# Patient Record
Sex: Male | Born: 1935 | Race: White | Hispanic: No | Smoking: Former smoker
Health system: Southern US, Community
[De-identification: ages and names within clinical notes are randomized; demographics above are authoritative.]

## PROBLEM LIST (undated history)

## (undated) DIAGNOSIS — M549 Dorsalgia, unspecified: Secondary | ICD-10-CM

## (undated) DIAGNOSIS — C4490 Unspecified malignant neoplasm of skin, unspecified: Secondary | ICD-10-CM

## (undated) DIAGNOSIS — G4700 Insomnia, unspecified: Secondary | ICD-10-CM

## (undated) DIAGNOSIS — R42 Dizziness and giddiness: Secondary | ICD-10-CM

## (undated) DIAGNOSIS — I82409 Acute embolism and thrombosis of unspecified deep veins of unspecified lower extremity: Secondary | ICD-10-CM

## (undated) DIAGNOSIS — R269 Unspecified abnormalities of gait and mobility: Secondary | ICD-10-CM

## (undated) DIAGNOSIS — I2699 Other pulmonary embolism without acute cor pulmonale: Secondary | ICD-10-CM

## (undated) DIAGNOSIS — I4891 Unspecified atrial fibrillation: Secondary | ICD-10-CM

## (undated) DIAGNOSIS — I2781 Cor pulmonale (chronic): Secondary | ICD-10-CM

## (undated) DIAGNOSIS — I10 Essential (primary) hypertension: Secondary | ICD-10-CM

## (undated) HISTORY — PX: FOOT SURGERY: SHX648

## (undated) HISTORY — DX: Unspecified atrial fibrillation: I48.91

## (undated) HISTORY — DX: Other pulmonary embolism without acute cor pulmonale: I26.99

## (undated) HISTORY — DX: Cor pulmonale (chronic): I27.81

## (undated) HISTORY — PX: SHOULDER SURGERY: SHX246

## (undated) HISTORY — DX: Dorsalgia, unspecified: M54.9

## (undated) HISTORY — DX: Acute embolism and thrombosis of unspecified deep veins of unspecified lower extremity: I82.409

## (undated) HISTORY — DX: Essential (primary) hypertension: I10

## (undated) HISTORY — DX: Unspecified malignant neoplasm of skin, unspecified: C44.90

## (undated) HISTORY — DX: Insomnia, unspecified: G47.00

## (undated) HISTORY — PX: BACK SURGERY: SHX140

## (undated) HISTORY — DX: Unspecified abnormalities of gait and mobility: R26.9

---

## 1997-10-31 ENCOUNTER — Ambulatory Visit (HOSPITAL_COMMUNITY): Admission: RE | Admit: 1997-10-31 | Discharge: 1997-10-31 | Payer: Self-pay | Admitting: Gastroenterology

## 1998-10-30 ENCOUNTER — Ambulatory Visit (HOSPITAL_COMMUNITY): Admission: RE | Admit: 1998-10-30 | Discharge: 1998-10-30 | Payer: Self-pay | Admitting: Neurosurgery

## 1998-10-30 ENCOUNTER — Encounter: Payer: Self-pay | Admitting: Neurosurgery

## 1999-03-31 ENCOUNTER — Encounter: Payer: Self-pay | Admitting: Neurosurgery

## 1999-04-01 ENCOUNTER — Inpatient Hospital Stay (HOSPITAL_COMMUNITY): Admission: RE | Admit: 1999-04-01 | Discharge: 1999-04-04 | Payer: Self-pay | Admitting: Neurosurgery

## 1999-04-01 ENCOUNTER — Encounter: Payer: Self-pay | Admitting: Neurosurgery

## 1999-07-01 ENCOUNTER — Encounter: Admission: RE | Admit: 1999-07-01 | Discharge: 1999-08-07 | Payer: Self-pay | Admitting: Neurosurgery

## 1999-09-04 ENCOUNTER — Encounter: Payer: Self-pay | Admitting: Neurosurgery

## 1999-09-04 ENCOUNTER — Ambulatory Visit (HOSPITAL_COMMUNITY): Admission: RE | Admit: 1999-09-04 | Discharge: 1999-09-04 | Payer: Self-pay | Admitting: Neurosurgery

## 1999-09-18 ENCOUNTER — Ambulatory Visit (HOSPITAL_COMMUNITY): Admission: RE | Admit: 1999-09-18 | Discharge: 1999-09-18 | Payer: Self-pay | Admitting: Neurosurgery

## 1999-09-18 ENCOUNTER — Encounter: Payer: Self-pay | Admitting: Neurosurgery

## 1999-10-03 ENCOUNTER — Ambulatory Visit (HOSPITAL_COMMUNITY): Admission: RE | Admit: 1999-10-03 | Discharge: 1999-10-03 | Payer: Self-pay | Admitting: Neurosurgery

## 1999-10-03 ENCOUNTER — Encounter: Payer: Self-pay | Admitting: Neurosurgery

## 2002-01-27 ENCOUNTER — Encounter: Admission: RE | Admit: 2002-01-27 | Discharge: 2002-01-27 | Payer: Self-pay | Admitting: Neurosurgery

## 2002-01-27 ENCOUNTER — Encounter: Payer: Self-pay | Admitting: Neurosurgery

## 2002-03-07 ENCOUNTER — Encounter: Payer: Self-pay | Admitting: Neurosurgery

## 2002-03-09 ENCOUNTER — Encounter: Payer: Self-pay | Admitting: Neurosurgery

## 2002-03-09 ENCOUNTER — Inpatient Hospital Stay (HOSPITAL_COMMUNITY): Admission: RE | Admit: 2002-03-09 | Discharge: 2002-03-12 | Payer: Self-pay | Admitting: Neurosurgery

## 2003-02-14 ENCOUNTER — Ambulatory Visit (HOSPITAL_COMMUNITY): Admission: RE | Admit: 2003-02-14 | Discharge: 2003-02-14 | Payer: Self-pay | Admitting: Orthopedic Surgery

## 2006-11-22 ENCOUNTER — Ambulatory Visit (HOSPITAL_COMMUNITY): Admission: RE | Admit: 2006-11-22 | Discharge: 2006-11-22 | Payer: Self-pay | Admitting: Gastroenterology

## 2006-11-23 ENCOUNTER — Ambulatory Visit (HOSPITAL_COMMUNITY): Admission: RE | Admit: 2006-11-23 | Discharge: 2006-11-23 | Payer: Self-pay | Admitting: Gastroenterology

## 2009-02-01 ENCOUNTER — Emergency Department (HOSPITAL_COMMUNITY): Admission: EM | Admit: 2009-02-01 | Discharge: 2009-02-02 | Payer: Self-pay | Admitting: Emergency Medicine

## 2009-04-05 ENCOUNTER — Encounter
Admission: RE | Admit: 2009-04-05 | Discharge: 2009-04-05 | Payer: Self-pay | Source: Home / Self Care | Admitting: Specialist

## 2010-03-02 HISTORY — PX: TOTAL HIP ARTHROPLASTY: SHX124

## 2010-04-01 LAB — SURGICAL PCR SCREEN: MRSA, PCR: NEGATIVE

## 2010-04-01 LAB — DIFFERENTIAL
Basophils Relative: 1 % (ref 0–1)
Eosinophils Absolute: 0.2 10*3/uL (ref 0.0–0.7)
Eosinophils Relative: 2 % (ref 0–5)
Lymphs Abs: 2.3 10*3/uL (ref 0.7–4.0)
Monocytes Relative: 8 % (ref 3–12)
Neutrophils Relative %: 61 % (ref 43–77)

## 2010-04-01 LAB — URINE MICROSCOPIC-ADD ON

## 2010-04-01 LAB — CBC
MCH: 33.3 pg (ref 26.0–34.0)
MCV: 96.4 fL (ref 78.0–100.0)
Platelets: 223 10*3/uL (ref 150–400)
RDW: 13 % (ref 11.5–15.5)
WBC: 7.9 10*3/uL (ref 4.0–10.5)

## 2010-04-01 LAB — BASIC METABOLIC PANEL
BUN: 22 mg/dL (ref 6–23)
CO2: 28 mEq/L (ref 19–32)
Chloride: 102 mEq/L (ref 96–112)
Creatinine, Ser: 0.97 mg/dL (ref 0.4–1.5)
Glucose, Bld: 90 mg/dL (ref 70–99)

## 2010-04-01 LAB — URINALYSIS, ROUTINE W REFLEX MICROSCOPIC
Hgb urine dipstick: NEGATIVE
Protein, ur: NEGATIVE mg/dL
Urine Glucose, Fasting: NEGATIVE mg/dL
Urobilinogen, UA: 0.2 mg/dL (ref 0.0–1.0)

## 2010-04-08 ENCOUNTER — Inpatient Hospital Stay (HOSPITAL_COMMUNITY): Payer: MEDICARE

## 2010-04-08 ENCOUNTER — Inpatient Hospital Stay (HOSPITAL_COMMUNITY)
Admission: RE | Admit: 2010-04-08 | Discharge: 2010-04-11 | DRG: 470 | Disposition: A | Discharge status: Home Health | Payer: MEDICARE | Attending: Orthopedic Surgery | Admitting: Orthopedic Surgery

## 2010-04-08 DIAGNOSIS — Z79899 Other long term (current) drug therapy: Secondary | ICD-10-CM

## 2010-04-08 DIAGNOSIS — H9190 Unspecified hearing loss, unspecified ear: Secondary | ICD-10-CM | POA: Diagnosis present

## 2010-04-08 DIAGNOSIS — R42 Dizziness and giddiness: Secondary | ICD-10-CM | POA: Diagnosis present

## 2010-04-08 DIAGNOSIS — M169 Osteoarthritis of hip, unspecified: Principal | ICD-10-CM | POA: Diagnosis present

## 2010-04-08 DIAGNOSIS — R39198 Other difficulties with micturition: Secondary | ICD-10-CM | POA: Diagnosis present

## 2010-04-08 DIAGNOSIS — H409 Unspecified glaucoma: Secondary | ICD-10-CM | POA: Diagnosis present

## 2010-04-08 DIAGNOSIS — M1610 Unilateral primary osteoarthritis, unspecified hip: Principal | ICD-10-CM | POA: Diagnosis present

## 2010-04-08 DIAGNOSIS — I10 Essential (primary) hypertension: Secondary | ICD-10-CM | POA: Diagnosis present

## 2010-04-08 LAB — ABO/RH: ABO/RH(D): O POS

## 2010-04-08 LAB — TYPE AND SCREEN
ABO/RH(D): O POS
Antibody Screen: NEGATIVE

## 2010-04-09 LAB — BASIC METABOLIC PANEL
Calcium: 8.3 mg/dL — ABNORMAL LOW (ref 8.4–10.5)
GFR calc Af Amer: >60 mL/min (ref >60)
GFR calc non Af Amer: >60 mL/min (ref >60)
Glucose, Bld: 165 mg/dL — ABNORMAL HIGH (ref 70–99)
Sodium: 136 mEq/L (ref 135–145)

## 2010-04-09 LAB — CBC
HCT: 36 % — ABNORMAL LOW (ref 39.0–52.0)
MCHC: 34.4 g/dL (ref 30.0–36.0)
Platelets: 221 10*3/uL (ref 150–400)
RDW: 12.9 % (ref 11.5–15.5)

## 2010-04-10 LAB — CBC
Hemoglobin: 11.7 g/dL — ABNORMAL LOW (ref 13.0–17.0)
MCHC: 34 g/dL (ref 30.0–36.0)

## 2010-04-10 LAB — BASIC METABOLIC PANEL
CO2: 32 mEq/L (ref 19–32)
Calcium: 8.3 mg/dL — ABNORMAL LOW (ref 8.4–10.5)
GFR calc Af Amer: >60 mL/min (ref >60)
GFR calc non Af Amer: >60 mL/min (ref >60)
Sodium: 140 mEq/L (ref 135–145)

## 2010-04-12 ENCOUNTER — Observation Stay (HOSPITAL_COMMUNITY)
Admission: EM | Admit: 2010-04-12 | Discharge: 2010-04-13 | DRG: 561 | Disposition: A | Discharge status: Home Health | Payer: MEDICARE | Attending: Orthopedic Surgery | Admitting: Orthopedic Surgery

## 2010-04-12 ENCOUNTER — Emergency Department (HOSPITAL_COMMUNITY): Payer: MEDICARE

## 2010-04-12 DIAGNOSIS — I498 Other specified cardiac arrhythmias: Secondary | ICD-10-CM | POA: Insufficient documentation

## 2010-04-12 DIAGNOSIS — H409 Unspecified glaucoma: Secondary | ICD-10-CM | POA: Insufficient documentation

## 2010-04-12 DIAGNOSIS — Z01812 Encounter for preprocedural laboratory examination: Secondary | ICD-10-CM | POA: Insufficient documentation

## 2010-04-12 DIAGNOSIS — Y92009 Unspecified place in unspecified non-institutional (private) residence as the place of occurrence of the external cause: Secondary | ICD-10-CM | POA: Insufficient documentation

## 2010-04-12 DIAGNOSIS — R42 Dizziness and giddiness: Secondary | ICD-10-CM | POA: Insufficient documentation

## 2010-04-12 DIAGNOSIS — Z01811 Encounter for preprocedural respiratory examination: Secondary | ICD-10-CM | POA: Insufficient documentation

## 2010-04-12 DIAGNOSIS — M1610 Unilateral primary osteoarthritis, unspecified hip: Secondary | ICD-10-CM | POA: Insufficient documentation

## 2010-04-12 DIAGNOSIS — Z96649 Presence of unspecified artificial hip joint: Secondary | ICD-10-CM | POA: Insufficient documentation

## 2010-04-12 DIAGNOSIS — W010XXA Fall on same level from slipping, tripping and stumbling without subsequent striking against object, initial encounter: Secondary | ICD-10-CM | POA: Insufficient documentation

## 2010-04-12 DIAGNOSIS — M169 Osteoarthritis of hip, unspecified: Secondary | ICD-10-CM | POA: Insufficient documentation

## 2010-04-12 DIAGNOSIS — T84029A Dislocation of unspecified internal joint prosthesis, initial encounter: Principal | ICD-10-CM | POA: Insufficient documentation

## 2010-04-12 LAB — BASIC METABOLIC PANEL
BUN: 14 mg/dL (ref 6–23)
CO2: 29 mEq/L (ref 19–32)
Chloride: 96 mEq/L (ref 96–112)
Creatinine, Ser: 0.82 mg/dL (ref 0.4–1.5)
Potassium: 3.2 mEq/L — ABNORMAL LOW (ref 3.5–5.1)

## 2010-04-12 LAB — DIFFERENTIAL
Basophils Absolute: 0 10*3/uL (ref 0.0–0.1)
Lymphocytes Relative: 13 % (ref 12–46)
Neutro Abs: 7.2 10*3/uL (ref 1.7–7.7)

## 2010-04-12 LAB — CBC
HCT: 35.3 % — ABNORMAL LOW (ref 39.0–52.0)
Hemoglobin: 12.3 g/dL — ABNORMAL LOW (ref 13.0–17.0)
WBC: 9.3 10*3/uL (ref 4.0–10.5)

## 2010-04-12 LAB — PROTIME-INR: INR: 1.1 (ref 0.00–1.49)

## 2010-04-13 NOTE — H&P (Signed)
[name], [name]                  ACCOUNT NO.:  [id number]  MEDICAL RECORD NO.:  [id number]          PATIENT TYPE:  INP  LOCATION:  NA                           FACILITY:  [facility]  PHYSICIAN:  Madlyn Frankel. Charlann Boxer, M.D.  DATE OF BIRTH:  10-30-1932  DATE OF ADMISSION: DATE OF DISCHARGE:                             HISTORY & PHYSICAL   ADMISSION DIAGNOSIS:  Right hip osteoarthritis.  BRIEF HISTORY:  This is the patient who was seen in Dr. Ranell Patrick' clinic and referred over to Dr. Charlann Boxer for evaluation of his right hip due to complaints of lingering pain, worsening pain, and interference with activities of daily living.  He was evaluated by Dr. Charlann Boxer and decided to proceed with an arthroplasty of the right hip.  Past medical history is somewhat benign.  He does have a history of vertigo from time to time and glaucoma.  He has had measles and mumps as a child.  He has occasional dizziness with some hearing loss, occasional rash with itching.  He does have urinary dysfunction with weak stream sometimes and orthopedically, has osteoarthritis and pain in the mornings.  Current medications are just some eye drops he uses for his glaucoma, he does not know the name of those and he takes doxepin every night for sleep.  ALLERGIES:  CODEINE.  SOCIAL HISTORY:  The patient is divorced.  He has BA from [place].  He has a past history of smoking.  He drinks alcohol very socially.  He has no history of substance abuse.  He has 1 child.  FAMILY HISTORY:  His mother had Alzheimer's.  Father had a stroke.  He had a brother with lung cancer and sister with cerebral palsy.  REVIEW OF SYSTEMS:  Notable for those difficulties described in the history present illness and past medical history.  His review of system sheet is otherwise unremarkable.  PHYSICAL EXAMINATION:  VITAL SIGNS:  The patient is 225 pounds.  He did not record a height.  His blood pressure today is 165/100.  He has been cautioned to go  back to his family practice doctor about that and get that under control prior to surgical date.  His respirations are 20, his pulse is 80. GENERAL:  Health is good. HEENT:  Shows he has glaucoma, some dizziness, and diminished hearing. NECK:  Unremarkable. CHEST:  Clear to auscultation bilaterally. HEART:  Has S1, S2.  There are no murmurs, rubs, or gallops. ABDOMEN:  Soft, nondistended. GI, GU:  Otherwise unremarkable except for the weak p.m. stream. EXTREMITIES:  Exam shows widespread osteoarthritis. DERMATOLOGICAL:  He does have a rash from time to time. NEUROLOGICAL:  Has vertigo that is transient.  LABORATORY DATA:  His labs, EKG and x-ray are pending through [facility].  IMPRESSION:  Right hip osteoarthritis.  PLAN:  He will admitted for right hip arthroplasty on February 7.  His discharge medications include Xarelto, Robaxin, MiraLax, Colace, 9 were given to him today.  His pain medicine will be given to him at discharge.     Russell L. Webb Silversmith, RN   ______________________________ Madlyn Frankel Charlann Boxer, M.D.  RLW/MEDQ  D:  03/27/2010  T:  03/27/2010  Job:  [id number]  Electronically Signed by Lauree Chandler NP-C on 04/09/2010 09:44:00 AM Electronically Signed by Durene Romans M.D. on 04/13/2010 09:17:57 AM

## 2010-04-13 NOTE — Op Note (Signed)
NAMEJESSE, NOSBISCH                  ACCOUNT NO.:  [id number]  MEDICAL RECORD NO.:  [id number]           PATIENT TYPE:  I  LOCATION:  0010                         FACILITY:  [facility]  PHYSICIAN:  Madlyn Frankel. Charlann Boxer, M.D.  DATE OF BIRTH:  September 25, 1932  DATE OF PROCEDURE:  04/08/2010 DATE OF DISCHARGE:                              OPERATIVE REPORT   PREOPERATIVE DIAGNOSIS:  Right hip osteoarthritis.  POSTOPERATIVE DIAGNOSIS:  Right hip osteoarthritis.  PROCEDURE:  Right total hip replacement utilizing DePuy component, size 56 pinnacle cup, 36 neutral Altrex liner, a size 11 high Trilock stem with 36 plus 5 asphere ball plus 5.  SURGEON:  Madlyn Frankel. Charlann Boxer, M.D.  ASSISTANT:  Kallie Edward  ANESTHESIA:  General.  SPECIMENS:  None.  COMPLICATIONS:  None.  DRAINS:  One Hemovac.  ESTIMATED BLOOD LOSS:  650 mL.  INDICATIONS FOR THE PROCEDURE:  Mr. Rosenow is a very pleasant 77 year old gentleman who was seen at the request of referring physician for evaluation of right hip arthritis.  He had evidence of calcification of his labrum as well as degenerative changes.  He had failed to respond to conservative measures including injections, medications as a significant reduction in his quality of life.  We discussed the hip replacement surgery.  The risks of infection, DVT, component failure, dislocation, need for revision surgery were all discussed and reviewed.  Consent was obtained for the benefit of pain relief.  PROCEDURE IN DETAIL:  The patient was brought to the operative theater. Once adequate anesthesia and preoperative antibiotics, Ancef 2 grams, administered, the patient was positioned to the left lateral decubitus position with the right side up.  The right lower extremity was then prepped and draped in a sterile fashion.  Time-out was performed identifying the patient, planned procedure and extremity.  An incision was based off the greater trochanter.  Sharp dissection was carried  to the iliotibial band and gluteal fascia which were incised posteriorly.  The short external rotators were identified and taken down separate from the posterior capsule.  An L capsulotomy was made and the hip dislocated.  A neck osteotomy was made based off anatomic landmarks utilizing the trial neck and head center of the patient's femoral head.  Following this, retractors were placed and attention was directed to the femur.  Femoral canal was opened with starting drill, hand reamed once, and then irrigated to try to prevent fat emboli.  I began broaching with a size 1 broach setting anteversion at about 20-25 degrees, setting a little bit more anteversion on his hip.  I broached up to a size 10 initially which sat a little bit lower than my neck cut.  A calcar planer was used to remove little bit of medial bone.  I packed off the femur down and went to the acetabulum.  Acetabular retractors were placed.  The labrum was debrided.  I began reaming with a 48 reamer, reamed up to 55 reamer with excellent bony bed preparation. Size 56 pinnacle cup was chosen and was impacted and sat at approximately 35 degrees of abduction and 20 degrees of forward flexion with  the anterior rim palpable.  Based on this and the anteversion, I went ahead and placed a single iliac screw hole eliminator and impacted the final 36 neutral Altrex liner.  Trial reduction was now carried out.  I went ahead and placed a 10 broach in high offset neck.  With a 36 plus 1.5 ball, there was about 4- 5 mm shuck; however, the hip remained stable throughout the range of motion and was shorter compared to the down leg.  I went ahead and dislocated the hip, removed the trial broach.  I broached to an 59 which sat a couple of millimeters prior to my neck cut, which I wanted.  We chose an 11 high Trilock stem.  It was then impacted and sat at the level where the broach was.  I did repeat the trial, ended up using a  36 plus 5 ball which got me down to about a millimeter shuck.  Again, the hip range of motion was excellent without evidence of impingement.  The final 36 plus 5 asphere ball was chosen and impacted onto clean and dry trunnion.  The hip was reduced.  We irrigated the hip throughout the case again at this point.  I reapproximated posterior capsule with a #1 Vicryl.  I placed a medium Hemovac drain deep.  The iliotibial band and gluteal fascia were then reapproximated using #1 Vicryl.  The remainder of wound was closed with 2-0 Vicryl and running 4-0 Monocryl.  The hip was cleaned, dried and dressed sterilely utilizing sealant and Aquacel dressing.  The drain site was dressed separately.  He was then extubated and brought to the recovery in stable condition tolerating the procedure well.     Madlyn Frankel Charlann Boxer, M.D.     MDO/MEDQ  D:  04/08/2010  T:  04/08/2010  Job:  [id number]  Electronically Signed by Durene Romans M.D. on 04/13/2010 09:18:02 AM

## 2010-04-14 ENCOUNTER — Emergency Department (HOSPITAL_COMMUNITY): Payer: MEDICARE

## 2010-04-14 ENCOUNTER — Ambulatory Visit (HOSPITAL_COMMUNITY)
Admission: EM | Admit: 2010-04-14 | Discharge: 2010-04-16 | Disposition: A | Discharge status: Skilled Nursing Facility | Payer: MEDICARE | Source: Home / Self Care | Attending: Emergency Medicine | Admitting: Emergency Medicine

## 2010-04-14 DIAGNOSIS — X58XXXA Exposure to other specified factors, initial encounter: Secondary | ICD-10-CM | POA: Insufficient documentation

## 2010-04-14 DIAGNOSIS — T84029A Dislocation of unspecified internal joint prosthesis, initial encounter: Secondary | ICD-10-CM | POA: Insufficient documentation

## 2010-04-14 DIAGNOSIS — I10 Essential (primary) hypertension: Secondary | ICD-10-CM | POA: Insufficient documentation

## 2010-04-14 DIAGNOSIS — Z96649 Presence of unspecified artificial hip joint: Secondary | ICD-10-CM | POA: Insufficient documentation

## 2010-04-14 DIAGNOSIS — R42 Dizziness and giddiness: Secondary | ICD-10-CM | POA: Insufficient documentation

## 2010-04-14 DIAGNOSIS — S73006A Unspecified dislocation of unspecified hip, initial encounter: Secondary | ICD-10-CM

## 2010-04-22 NOTE — Op Note (Signed)
  [name], [name]                  ACCOUNT NO.:  [id number]  MEDICAL RECORD NO.:  [id number]           PATIENT TYPE:  I  LOCATION:  1618                         FACILITY:  [facility]  PHYSICIAN:  Georges Lynch. Jairy Angulo, M.D.DATE OF BIRTH:  10-01-1932  DATE OF PROCEDURE:  04/14/2010 DATE OF DISCHARGE:                              OPERATIVE REPORT   Note, I got call from emergency room late this afternoon at [facility] on April 14, 2010 that Mr. Morss redislocated his right total hip. Apparently, he had a right total hip done about little over a week ago by Dr. Charlann Boxer and he dislocated his hip this Saturday, 2 days ago.  He was discharged from the hospital on Sunday, went home and said that he was getting out of car and he slipped down the side of the car and he was not sure that is what happened but anyway, he presented this afternoon with a dislocated right total hip.  X-rays were reviewed by me in the emergency room.  I did see him in the emergency room, discussed the case with him and had him signed a consent.  PROCEDURE:  Closed reduction of the right anterior and superior dislocation of right total hip.  PREOPERATIVE DIAGNOSIS:  Closed reduction of the right anterior and superior dislocation of right total hip.  POSTOPERATIVE DIAGNOSIS:  Closed reduction of the right anterior and superior dislocation of right total hip.  DESCRIPTION OF PROCEDURE:  Under general anesthesia, after I marked the right leg in the holding area and after we did the appropriate time-out, I did a closed manipulation under general anesthesia with MAC for muscle relaxation.  The hip went in easily and AP x-ray verified the hip was well located.  There were no other abnormalities noted.  He had good motion of his foot, good circulation, pre and postop.  He was admitted to the hospital after placed in the PAS hose and a knee immobilizer.  He will be kept overnight.           ______________________________ Georges Lynch. Darrelyn Hillock, M.D.     RAG/MEDQ  D:  04/14/2010  T:  04/14/2010  Job:  [id number]  cc:   Madlyn Frankel Charlann Boxer, M.D. Fax: [phone]  Electronically Signed by Ranee Gosselin M.D. on 04/22/2010 01:11:39 PM

## 2010-04-25 NOTE — Discharge Summary (Signed)
  [name], [name]                  ACCOUNT NO.:  [id number]  MEDICAL RECORD NO.:  [id number]           PATIENT TYPE:  I  LOCATION:  1618                         FACILITY:  [facility]  PHYSICIAN:  Madlyn Frankel. Charlann Boxer, M.D.  DATE OF BIRTH:  06/20/1932  DATE OF ADMISSION:  04/14/2010 DATE OF DISCHARGE:                              DISCHARGE SUMMARY   This patient is less than 2 weeks, status post right total hip arthroplasty.  He has been admitted twice in the past 5 days for dislocation.  He was admitted yesterday and released for reduction of a dislocated right hip by Dr. Darrelyn Hillock.  His discharge condition is good.  His discharge plan will be for SNF rehabilitation for safety.  His discharge medications have not changed since previous discharge. 1. He will have Colace available as needed. 2. Hydrocodone for pain 7.5/500 mg 1-2 p.o. q.4-6 h. p.r.n. 3. Robaxin 500 mg q.6 h. p.r.n.  His discharge instructions were given.  He notes that he cannot go back home due to the fact that he keeps dislocating his hip to prevent further dislocation and initiate safety measures.  He will go to a SNF hopefully today.     Russell L. Webb Silversmith, RN   ______________________________ Madlyn Frankel Charlann Boxer, M.D.    RLW/MEDQ  D:  04/15/2010  T:  04/16/2010  Job:  [id number]  Electronically Signed by Lauree Chandler NP-C on 04/24/2010 01:21:41 PM Electronically Signed by Durene Romans M.D. on 04/25/2010 07:03:01 AM

## 2010-04-25 NOTE — H&P (Signed)
  [name], [name]                  ACCOUNT NO.:  [id number]  MEDICAL RECORD NO.:  [id number]           PATIENT TYPE:  I  LOCATION:  1618                         FACILITY:  [facility]  PHYSICIAN:  Madlyn Frankel. Charlann Boxer, M.D.  DATE OF BIRTH:  [birth year]  DATE OF ADMISSION:  04/14/2010 DATE OF DISCHARGE:                             HISTORY & PHYSICAL   This patient is less than 2 weeks, status post right total hip arthroplasty.  He has been admitted twice in the past week for dislocation.  He was admitted on the 13th and released by Dr. Darrelyn Hillock.  The patient's discharge condition is good.  His other discharge information is not changed since his surgical discharge and his last discharge for dislocation is on Sunday.  He will follow up with Dr. Charlann Boxer as scheduled in the next week.  DISPOSITION/PLAN:  SNF at this time for safety.     Russell L. Webb Silversmith, RN   ______________________________ Madlyn Frankel Charlann Boxer, M.D.    RLW/MEDQ  D:  04/15/2010  T:  04/16/2010  Job:  [id number]  Electronically Signed by Lauree Chandler NP-C on 04/24/2010 01:21:44 PM Electronically Signed by Durene Romans M.D. on 04/25/2010 07:02:58 AM

## 2010-05-09 NOTE — Discharge Summary (Signed)
  [name], [name]                  ACCOUNT NO.:  [id number]  MEDICAL RECORD NO.:  [id number]           PATIENT TYPE:  I  LOCATION:  1612                         FACILITY:  [facility]  PHYSICIAN:  Madlyn Frankel. Charlann Boxer, M.D.  DATE OF BIRTH:  1932/11/08  DATE OF ADMISSION:  04/08/2010 DATE OF DISCHARGE:  04/11/2010                              DISCHARGE SUMMARY   BRIEF HISTORY:  The patient was seen to Dr. Ranell Patrick office and referred over to Dr. Charlann Boxer for evaluation of his right hip pain.  It was worsening pain and interfering with his activities of daily living, and he finally decided to proceed with arthroplasty of the right hip.  PAST MEDICAL HISTORY:  Somewhat benign.  He has vertigo from time to time.  He had measles and mumps as a child, mild hearing loss and occasional rash, and mild urinary dysfunction with osteoarthritis.  DISCHARGE DIAGNOSIS:  Right hip osteoarthritis, status post arthroplasty.  HOSPITAL COURSE:  The patient was admitted through same-day surgery on February 7, taken to the operating theater and underwent right hip arthroplasty by anterior approach without any difficulties.  He was brought to PACU for recovery and then to [facility] for further recovery and rehabilitation.  Since that time, he has advanced his diet to regular, has been up with physical therapy and done well.  DISCHARGE CONDITION:  Good.  Discharge instructions were given to the patient.  He has a Aquacel dressing in place.  He can shower and he is to keep the wounds dry as possible otherwise for 2 weeks.  He will follow up with Dr. Charlann Boxer in 2 weeks.  He will have home health physical therapy.  DISCHARGE MEDICATIONS:  Include: 1. Acetaminophen 325 mg every 4 hours as needed. 2. Dulcolax as needed. 3. Ferrous sulfate 325 mg 3 times a day. 4. Robaxin 500 mg every 6 hours as needed. 5. MiraLax 17 grams a day as needed. 6. Xarelto 10 mg every day for 10 days. 7. Tramadol 1-2 every 4 to 6 hours for  pain. 8. Diastone 75 mg at bedtime. 9. Glucosamine/chondroitin daily. 10.Hydrochlorothiazide 25 mg a day. 11.L-Arginine 1000 mg twice daily. 12.Lumigan 0.3% drops in both eyes. 13.He takes over-the-counter red yeast rice, vitamin C, vitamin E. 14.He uses Voltaren gel as needed.     Gregory L. Webb Silversmith, RN   ______________________________ Madlyn Frankel Charlann Boxer, M.D.    RLW/MEDQ  D:  04/11/2010  T:  04/11/2010  Job:  [id number]  Electronically Signed by Durene Romans M.D. on 05/09/2010 07:04:48 AM

## 2010-05-18 NOTE — H&P (Signed)
NAMEKAMARII, [name]                  ACCOUNT NO.:  [id number]  MEDICAL RECORD NO.:  [id number]           PATIENT TYPE:  E  LOCATION:  WLED                         FACILITY:  [facility]  PHYSICIAN:  Leonides Grills, M.D.     DATE OF BIRTH:  07/09/1932  DATE OF ADMISSION:  04/12/2010 DATE OF DISCHARGE:                             HISTORY & PHYSICAL   CHIEF COMPLAINTS:  Right hip pain.  BRIEF HISTORY:  The patient is a 77 year old male with recent right total hip on April 08, 2010, by Dr. Charlann Boxer.  The patient went to bathroom today earlier, was wiping up something on the floor, and the patient "hit the floor."  He was unable to bear weight on the right hip. No loss of consciousness.  No chest pain.  No shortness breath.  No other injury other than the right hip.  No recent bowel movement.  He is eating and drinking well.  PAST MEDICAL HISTORY: 1. Vertigo. 2. Glaucoma.  MEDICATIONS:  Please see chart.  ALLERGIES: 1. CODEINE. 2. MORPHINE SULFATE.  FAMILY HISTORY:  Mother with Alzheimer's.  Father with stroke.  Brother with lung cancer.  Sister with cerebral palsy.  REVIEW OF SYSTEMS:  Negative for cardiac disease, diabetes, respiratory disease, fevers, chills, gout, or rheumatoid arthritis.  Positive for vertigo and glaucoma.  PHYSICAL EXAMINATION:  VITAL SIGNS:  Blood pressure 151/96, pulse 109, respiratory rate 14, 96% on nasal cannula, 2 liters.  The patient is afebrile. GENERAL:  The patient is a well-developed, well-nourished male, in no acute distress. CARDIAC:  Regular rate and rhythm.  No murmurs, rubs, or gallops. CHEST:  Clear to auscultation bilaterally.  No wheezing, rhonchi, or rales. ABDOMEN:  Soft, nontender.  Bowel sounds throughout in 4 quadrants. EYES:  Extraocular movements are intact. SKIN:  Surgical incision right hip is well approximated with a subcu stitch.  There is no purulence.  No signs of infection. NEURO:  L4-S1 intact bilaterally.  Dorsiflexion,  plantar flexion bilaterally intact. EXTREMITIES:  The bilateral lower extremities nontender.  Bilateral hips, right leg is out of length.  No gross deformities.  No tenderness of bilateral lower legs. VASCULAR:  Dorsal pedal pulses 2+ bilaterally.  RADIOGRAPHS:  Initial right hip film done earlier today showed posterior dislocation of right total hip arthroplasty without any pelvic fractures or femoral shaft fractures, status post reduction right hip, prosthesis well located.  No acute fractures.  ASSESSMENT/PLAN:  The patient is a 77 year old male with an acute right total hip dislocation.  Patient is status post recent right total hip on April 08, 2010, by Dr. Charlann Boxer.  Right hip dislocation was reduced in the ER under conscious sedation.  Right leg knee immobilizer applied and is to be on all times.  The patient is to weight bear as tolerated.  The patient will be seen by Dr. Charlann Boxer in a.m.     Richardean Canal, P.A.   ______________________________ Leonides Grills, M.D.    GC/MEDQ  D:  04/12/2010  T:  04/12/2010  Job:  [id number]  cc:   Madlyn Frankel Charlann Boxer, M.D. Fax: [phone]  Electronically Signed by Sullivan Lone  CLARK P.A. on 05/06/2010 09:11:28 AM Electronically Signed by Leonides Grills M.D. on 05/18/2010 08:32:08 AM

## 2010-06-21 NOTE — Discharge Summary (Signed)
  [name], Gregory                  ACCOUNT NO.:  [id number]  MEDICAL RECORD NO.:  [id number]           PATIENT TYPE:  I  LOCATION:  1605                         [facility]ACILITY:  [facility]  PHYSICIAN:  Leonides Grills, M.D.     DATE OF BIRTH:  [birth year]  DATE OF ADMISSION:  04/12/2010 DATE OF DISCHARGE:  04/13/2010                        DISCHARGE SUMMARY - REFERRING   ADMITTING DIAGNOSES: 1. Right total hip dislocation. 2. Vertigo. 3. Glaucoma.  DISCHARGE DIAGNOSES: 1. Status post closed reduction right total hip dislocation. 2. Vertigo.  HISTORY OF PRESENT ILLNESS:  This is a 77 year old male with a recent right total hip on April 08, 2010, by Dr. Charlann Boxer.  The patient went to the bathroom on April 12, 2010, and was wiping up some kind of floor and the patient "hit the floor."  He was unable to bear weight on the right hip.  No loss of consciousness.  No chest pain or shortness of breath.  No other injury other than the right hip.  The patient was brought to the [facility] ER where he was found to have a posterior dislocation of his right total hip arthroplasty.  No other pelvic fractures identified.  Right total hip was reduced in the ER under conscious sedation.  The immobilizer was applied.  The patient was admitted for pain control.  CONSULTS:  PT, OT consult.  HOSPITAL COURSE:  The patient was admitted for less than 24-hour observation status post closed reduction under conscious sedation of a right total hip.  The patient afebrile, vital signs stable on April 13, 2010.  The patient had good oral intake.  No nausea or vomiting. Pain under control.  The patient was to work with physical therapy and then discharged to home once he had worked with therapy to review hip precautions.  RADIOGRAPHS:  Radiographs dated April 12, 2010, right hip, two-view, showed right hip prosthesis dislocation superiorly and posterior dislocation, no fractures. 1. Chest x-ray dated  April 12, 2010, showed no acute     cardiopulmonary findings. 2. Portable hip, one-view right hip, showed right hip arthroplasty to     be well located, no bony abnormalities.  DISCHARGE INSTRUCTIONS: 1. The patient weightbear as tolerated.  Knee immobilizer on the right     leg. 2. Wound care:  Keep wound dry until followup with Dr. Charlann Boxer in 2     weeks. 3. Home health/PT:  Interim home health.  CONDITION ON DISCHARGE:  The patient discharged to home in good stable condition.  DIET:  Regular diet, no restrictions.  Dictated For:  Dr. Leonides Grills.     Richardean Canal, P.A.   ______________________________ Leonides Grills, M.D.    GC/MEDQ  D:  05/07/2010  T:  05/07/2010  Job:  [id number]  cc:   Leonides Grills, M.D. Fax: [phone]  Madlyn Frankel. Charlann Boxer, M.D. Fax: [phone]  Electronically Signed by Richardean Canal P.A. on 05/28/2010 02:46:08 PM Electronically Signed by Leonides Grills M.D. on 06/21/2010 07:49:21 AM

## 2010-07-01 DIAGNOSIS — I2699 Other pulmonary embolism without acute cor pulmonale: Secondary | ICD-10-CM

## 2010-07-01 HISTORY — DX: Other pulmonary embolism without acute cor pulmonale: I26.99

## 2010-07-18 NOTE — Op Note (Signed)
[name]  Patient:    [name]                          MRN: [id number] Proc. Date: 04/01/99 Adm. Date:  [id number] Attending:  Jackelyn Knife                           Operative Report  PREOPERATIVE DIAGNOSIS:  Spinal stenosis L3-4.  POSTOPERATIVE DIAGNOSIS:  Spinal stenosis L3-4.  OPERATION PERFORMED:  Bilateral decompressive L3, L4, hemilaminotomies, medial facetectomy on the right side.  SURGEON:  Izell [place]. Elesa Hacker, M.D.  ASSISTANT:  Clydene Fake, M.D.  DESCRIPTION OF PROCEDURE:  Under general endotracheal anesthesia, this man was positioned prone over laminectomy cushions.  The lumbosacral area was prepped and the patient draped in the usual manner.  A preliminary cross-table lateral metallic marker in place was taken to assure proper localization of the incision.  The skin was then opened and a bilateral approach was made in such a way to allow access to the L3, L4 intralaminar space on each side.  Self-retaining retractors were put  into place after repeating a second cross-table lateral with additional instruments in place to positively identify the proper level.  Using the operating microscope and high-speed drill, hemilaminotomies were performed.  The spinous process and posterolateral sheath and ligament were preserved.  The greatly thickened ligamentum flavum was encountered and removed. This was a large part of the stenotic problem as was bony overgrowth.  Care was  taken to do a medial facetectomy on the right with foraminotomies to assure adequate decompression on the symptomatic side.  After this, the wound was copiously irrigated with antibiotic solution.  Hemostasis was obtained and the wound was then closed in layers.  A sterile dressing was applied and the patient was taken to the recovery room in good condition. DD:  04/01/99 TD:  04/01/99 Job: [id number]

## 2010-07-18 NOTE — Discharge Summary (Signed)
[place]  Patient:    [name]                          MRN: [id number] Adm. Date:  [id number] Disc. Date: [id number] Attending:  Jackelyn Knife                           Discharge Summary  SUMMARY:  [name] is a 77 year old man who had been followed along over he last several months because of a progressive problem of lumbar spinal stenosis t L3-4.  He eventually elected to undergo bilateral decompressive L3-4 surgery and this as carried out on April 01, 1999, in the form of bilateral decompressive hemilaminotomies and foraminotomies on the right side at L3-4.  Postoperatively, he had some persistent serosanguineous drainage from his wound  that initially was rather copious and prolonged his hospital stay by two days. He had a minor temperature elevation as well and was, therefore, kept on his perioperative antibiotics for an additional day.  Cipro was then started by mouth prior to discharge and my plan was to continue that.  I discontinued his IV antibiotics as his Cipro became effective.  In addition to the drainage, he had  rather severe nausea initially in the first 24 to 36 hours postoperatively, but  this was managed with a change in his medication.  At the time of discharge he was able to be up and about and his drainage had stopped.  FINAL DIAGNOSES: 1. Lumbar spinal stenosis L3-4. 2. Persistent postoperative wound drainage which has stopped at the time of    discharge. 3. Early, but rather persistent, postoperatively nausea that contributed to a    longer hospital stay.  CONDITION AT DISCHARGE:  Improving.  INSTRUCTIONS TO PATIENT: 1. Up ad lib with a 10 pound weight lifting restriction for four weeks. 2. Regular diet. 3. He is to take his temperature and record it three to four times a day. 4. Discharge medications:  Vicodin and an additional five days of Cipro. 5. He is to come back to see me in five  days in the office. DD:  04/04/99 TD:  04/05/99 Job: [id number]

## 2010-07-18 NOTE — Discharge Summary (Signed)
[name], [name] NO.:  [id number]   MEDICAL RECORD NO.:  [id number]                   PATIENT TYPE:  INP   LOCATION:  3009                                 FACILITY:  [facility]   PHYSICIAN:  Clydene Fake, M.D.               DATE OF BIRTH:  [birth year]   DATE OF ADMISSION:  03/09/2002  DATE OF DISCHARGE:  03/12/2002                                 DISCHARGE SUMMARY   DIAGNOSIS:  Herniated nucleus pulposus, L2-3 with stenosis, spondylosis and  instability at 3-4 and 4-5 in a patient with prior surgery.   DISCHARGE DIAGNOSIS:  Herniated nucleus pulposus, L2-3 with stenosis,  spondylosis and instability at 3-4 and 4-5 ina  patient with prior surgery.   PROCEDURE:  1. Re-do decompressive laminotomy at L2-3, 3-4 and 4-5 with diskectomy at     right 2-3.  2. Posterior lumbar interbody fusion at L2-3, 3-4 and 4-5 with interbody     cages, Leopard cage at 2-3 and Brown cages at 3-4 and 4-5, segmented     pedicle screw fixation L2 through 5 with posterolateral fusion L2 through     5 with autograft, allograft and bone marrow aspirate.   REASON FOR ADMISSION:  The patient is a 77 year old gentleman who has  undergone two lumbar laminectomies in the past couple years who continues to  have back pain.  The pain radiates bilaterally in his back, down his legs,  upper legs, not usually past the knee.  Any activity seems to make things  worse and there has been worsening over the last couple years, especially  the last few months.  Epidural injections and other interventions have not  helped.   The work up included an MRI and x-ray showing severe degeneration and  instability at 3-4 and 4-5, prior surgery seen and there is a disk  herniation at the right side at 2-3.  The patient is brought in for  decompression and fusion.   HOSPITAL COURSE:  The patient was admitted on the day of surgery and  underwent the procedure as above without complications.  750 cc  of Cell  Saver blood was given back.  The patient was watched, sent down to Intensive  Care Unit and remained stable.  Postoperatively, he was moving his legs  well.  Sensory and motor strength seemed intact.  The incision was clean,  dry and intact.  H&H first day postoperatively was 12.1 and 35.3. The  patient seemed to be doing well, was transferred to the floor on the 9th and  worked on increasing his activities with brace.  PT and OT were consulted.  He continued to improve.  By the 11th, he was doing well and the incision  remained clean, dry and intact.  He was ambulating well.  He was discharged  home in stable condition.   DISCHARGE MEDICATIONS:  Same as pre-hospitalization plus  Percocet and  Flexeril.   FOLLOW UP:  Will be in two weeks in my office.  No driving for five days.  No strenuous activity with brace.                                               Clydene Fake, M.D.    JRH/MEDQ  D:  04/06/2002  T:  04/07/2002  Job:  [id number]

## 2010-07-18 NOTE — Op Note (Signed)
NAME:  [name] NO.:  [id number]   MEDICAL RECORD NO.:  [id number]                   PATIENT TYPE:  INP   LOCATION:  2859                                 FACILITY:  [facility]   PHYSICIAN:  Clydene Fake, M.D.               DATE OF BIRTH:  04/14/1932   DATE OF PROCEDURE:  DATE OF DISCHARGE:                                 OPERATIVE REPORT   PREOPERATIVE DIAGNOSES:  Herniated nucleus pulposus, L2-3 with stenosis,  spondylosis and instability at 2-3, 3-4 and 4-5 lumbar spine with prior  surgery.   POSTOPERATIVE DIAGNOSES:  Herniated nucleus pulposus, L2-3 with stenosis,  spondylosis and instability at 2-3, 3-4 and 4-5 lumbar spine with prior  surgery.   PROCEDURE:  1. Repeat decompressive laminectomy at L2-3, 3-4 and 4-5.  2. Diskectomy, right side, L2-3 for the herniated nucleus pulposus.  3. Posterior lumbar interbody fusion, L2-3, 3-4 and 4-5.  4. Interbody cages at three levels (Leopard cage at L2-3, Browning cages     bilaterally at L3-4 and 4-5).  5. Segmented Monarch pedicle screw fixation L2 through 5, posterolateral     fusion L2-5 (three levels).  6. Autograft bone.  7. Allow graft, bone marrow aspirate.   SURGEON:  Dr. Phoebe Perch.   ANESTHESIA:  General.   ESTIMATED BLOOD LOSS:  1500 cc.   BLOOD GIVEN:  750 cc of Cell Saver blood given back.   COMPLICATIONS:  None.   DISPOSITION:  To recovery room in stable condition.   REASON FOR PROCEDURE:  Patient is a 77 year old gentleman who has undergone  two lumbar laminectomies in the past but over the last couple of years  continued to have back pain.  The pain radiates bilaterally in his back,  radiating down his legs and the upper legs, not usually past the knee, but  every so often it does.  Any activity seems to make things worse.  He has  been through epidural injections and other interventions that have not  helped, the pain has been worsening again over time.   Work up  including MRI and x-rays show severe degeneration and some  instability at 3-4 and 4-5, prior surgery seen and a disk herniation on the  right side at L2-3.  The patient is brought in for decompression and fusion.   PROCEDURE IN DETAIL:  The patient was brought into the operating room,  general anesthesia was induced and the patient was placed in the prone  position on the Wilson frame with all pressure points padded.  The patient  was prepped and draped in the sterile fashion.  The surgical incision was  injected with 10 cc of 1% Lidocaine with epinephrine.  Incision was then  made at the site of previous scar but incision was extended both cephalad  and caudally.  Incision taken to fascia, hemostasis obtained with Bovie  cauterization.  Fascia was  incised and subperiosteal dissection was done  over the L1-2-3-4 spinous processes and lamina to the facets.  There was a  lot of scar between 4-5 and looks like 5 laminectomy had already been  performed along with hemilaminotomy at other levels.  We dissected out  laterally and found the transverse process of L2, 3, 4 and 5 and dissected.  At this point, x-rays obtained confirmed our positioning.  Markers at the  levels of 2, 3 and 4.  We started our decompression using Kerrison punches,  Leksell rongeurs and a high speed drill.  Right-sided decompressive  laminectomy was performed at 2-3, along with a facetectomy, uncovering the  central and both the upper and lower nerve roots.  This dissection was then  continued caudally and bilateral laminectomy including bilateral facetectomy  was then performed at 3-4 and 4-5 and allowed dissection through scar tissue  up to both those levels.  Some scar was left attached to the dura when  necessary.  We performed extensive foraminotomies at the lumen of the facets  and exposing the lateral disk spaces along with the upper root at each of  these levels.  Once we had good decompression, we started  exploring disk  position, we started at the right side at 2-3 disk space, hemostasis  obtained with bipolar cauterization.  The disk space was incised, diskectomy  performed with pituitary rongeurs and a ________ placed up under the dura,  up under the upper root and free fragment of disk was obtained, removing the  disk herniation that we had seen on the MRI.  When we were finished with the  decompression, the upper and lower roots, thecal sac at that side.  Attempt  was then taken to 3-4.  Disk space incised bilaterally, diskectomy performed  with pituitary rongeurs and then distracted the interspace with interspace  distractor to 11 mm.  We then started preparing the interspace for interbody  cages by use of scrapers, various broaches and scrapers at the end plates  and at the final approach with the broaches on the side while we kept the  distraction on the other, we then reversed, preparing both sides.  All the  bone that was removed during laminectomy was cleaned, chopped up into small  pieces and to this DVX putty was added.  This mixture was then packed into  two Browning cages 11 high x 9 wide and bone was also packed into the  interspace at 3-4.  We then tapped the [facility] cage into place and repeated  this process on the opposite side.  This was all done using fluoroscopic  imaging as a guide.  At the 4-5 disk space, hemostasis with bipolar  cauterization, incised the disk space and performed diskectomy with  pituitary rongeurs, distracted the interspace up to 11 mm and continued  cleaning out disk and prepared the interspace for cage seating by removing  the cages and end plate with these various broaches and scrapers and then  used the final curving broach on each side while holding distraction on the  opposite side.  We then filled two 11 high x 9 mm wide Browning cages with  autograft bone mixture and packed the interspace with bone.  We then tapped cage in the one side and  repeated the process on the opposite side.  When we  were finished, we did decompression in the central canal and bilateral nerve  roots at the 3-4 and 4-5 level.  Gelfoam thrombin was placed  at the disk  spaces for continuous hemostasis.  We then found our pedicle screw entry  points on the left side. Using a high speed drill to decorticate, we placed  a pedicle probe down the pedicle at L2, 3, 4 and 5 under fluoroscopic  imaging.  We used a ball probe to feel down the hole after removing the  probe indicating that we were in bone.  The pedicle was tapped and then 6.24  diameter pedicle screws were placed, 50 mm screws were used in L2, 4 and 5,  50 mm screw was used in L3 on the left side.  A rod was placed into the  screw heads and locking nuts placed over those and the screw heads were  tightened at L5 and at L4 there was some compression over the interspace and  that was tightened, we then compressed over the 3-4 interspace as we  tightened the L3 screw.  We then placed an interspace distractor in the 2-3  interspace on the right side, distracted up to 11 mm.  We then distracted  between L2 and 3 pedicle screws on the left side and then tightened the L2  screw, holding distraction at the 2-3 interspace.  We removed the distractor  from the right side and then used the Acumed T-lift instruments to prepare  the interspace for cage fusion using a Leopard cage for T-lift approach.  We  used various shavers and broaches, removing the cartilaginous implant and  getting good bleeding bone in the air space.  We then placed a trial, first  a 10 and then 11 mm high Leopard cage trial that fit well.  We packed an 11  mm Leopard cage with the autograft bone mixture and then placed this bone  into the interspace.  We then tapped the cage into place and using various  attempts got into a final position across the midline.  Fluoroscopic imaging  was used to guide this process.  At this point, we held  the pedicle points  of the right side at L2, 3, 4 and 5, using fluoroscopic imaging and topical  guideline, we decorticated the entry point with a high speed drill and  placed the pedicle probe down the pedicle.  We aspirated bone marrow blood  from each pedicle, we did this also on the left side and right side.  We  added this bone marrow aspirate to conduits from substitute to use in the  posterolateral fusion later in the case. We probed the pedicle with ball  probe, making sure there is no breach at the L2 level, there did seem to be  mal position and we repositioned this down the pedicle, tapped and re-  checked with the ball probe and then placed our pedicle screws.  We used  6.25 diameter Monarch screws by 50 mm long in each of the holes on the right  side.  Once the screws were in, rod was placed over the screw heads, locking  it into place and then starting at L5, we final tightened the nut with some compression over 4-5 we tightened the 4 nut, with compression over 3-4 we  tightened the 3 nut.  We then loosed the nut over the L2 screw in the left  side and compressed over the 2-3 interspace while we re-tightened the nut  over the 2 screw with compression over 2-3 on the right, we again tightened  that over the final construct.  Final AP and lateral images  were obtained  showing good position of all screws, rods and interbody cages at all three  levels.  The wounds were irrigated with antibiotic solution, transverse  processes were decorticated with high speed drill, lateral facets and the  rest of the autograft bone DVX putty and conduit bone substituted along with  the bone marrow aspirate was then placed in the posterolateral gutter from  L2 to L5 bilaterally for posterolateral fusion.  Gelfoam was placed over the  nerve roots to protect them from any bone falling on them.  We retracted all  the roots, air well decompressed, the retractors were removed and the  paraspinous  muscles and then fascia were closed with 0 Vicryl interrupted  suture and the subcutaneous tissues closed with 0, 2-0 and 3-0 Vicryl  interrupted suture and the skin closed with Benzoin and Steri-Strips.  Status post closure, the patient was placed back in the dorsals supine  position, awakened from anesthesia and transferred to recovery room in  stable condition.                                               Clydene Fake, M.D.    JRH/MEDQ  D:  03/09/2002  T:  03/10/2002  Job:  [id number]

## 2010-07-24 ENCOUNTER — Encounter: Payer: Self-pay | Admitting: Family Medicine

## 2010-07-24 ENCOUNTER — Encounter: Payer: Self-pay | Admitting: *Deleted

## 2010-07-24 DIAGNOSIS — G4700 Insomnia, unspecified: Secondary | ICD-10-CM | POA: Insufficient documentation

## 2010-07-24 DIAGNOSIS — H409 Unspecified glaucoma: Secondary | ICD-10-CM | POA: Insufficient documentation

## 2010-07-24 DIAGNOSIS — C4490 Unspecified malignant neoplasm of skin, unspecified: Secondary | ICD-10-CM | POA: Insufficient documentation

## 2010-07-24 DIAGNOSIS — M549 Dorsalgia, unspecified: Secondary | ICD-10-CM | POA: Insufficient documentation

## 2010-07-25 ENCOUNTER — Ambulatory Visit (INDEPENDENT_AMBULATORY_CARE_PROVIDER_SITE_OTHER): Payer: Medicare Other | Admitting: Cardiology

## 2010-07-25 ENCOUNTER — Encounter: Payer: Self-pay | Admitting: Cardiology

## 2010-07-25 ENCOUNTER — Ambulatory Visit
Admission: RE | Admit: 2010-07-25 | Discharge: 2010-07-25 | Disposition: A | Discharge status: Home / Self Care | Payer: Medicare Other | Source: Ambulatory Visit | Attending: Cardiology | Admitting: Cardiology

## 2010-07-25 ENCOUNTER — Inpatient Hospital Stay (HOSPITAL_COMMUNITY)
Admission: EM | Admit: 2010-07-25 | Discharge: 2010-08-01 | DRG: 176 | Disposition: A | Discharge status: Home / Self Care | Payer: Medicare Other | Attending: Emergency Medicine | Admitting: Emergency Medicine

## 2010-07-25 DIAGNOSIS — G4700 Insomnia, unspecified: Secondary | ICD-10-CM | POA: Diagnosis present

## 2010-07-25 DIAGNOSIS — Z7982 Long term (current) use of aspirin: Secondary | ICD-10-CM

## 2010-07-25 DIAGNOSIS — Z7901 Long term (current) use of anticoagulants: Secondary | ICD-10-CM

## 2010-07-25 DIAGNOSIS — Z96649 Presence of unspecified artificial hip joint: Secondary | ICD-10-CM

## 2010-07-25 DIAGNOSIS — R0989 Other specified symptoms and signs involving the circulatory and respiratory systems: Secondary | ICD-10-CM

## 2010-07-25 DIAGNOSIS — R911 Solitary pulmonary nodule: Secondary | ICD-10-CM | POA: Diagnosis present

## 2010-07-25 DIAGNOSIS — H409 Unspecified glaucoma: Secondary | ICD-10-CM | POA: Diagnosis present

## 2010-07-25 DIAGNOSIS — I10 Essential (primary) hypertension: Secondary | ICD-10-CM | POA: Diagnosis present

## 2010-07-25 DIAGNOSIS — R0902 Hypoxemia: Secondary | ICD-10-CM

## 2010-07-25 DIAGNOSIS — R0602 Shortness of breath: Secondary | ICD-10-CM

## 2010-07-25 DIAGNOSIS — I2699 Other pulmonary embolism without acute cor pulmonale: Principal | ICD-10-CM | POA: Diagnosis present

## 2010-07-25 DIAGNOSIS — Z85828 Personal history of other malignant neoplasm of skin: Secondary | ICD-10-CM

## 2010-07-25 DIAGNOSIS — R0600 Dyspnea, unspecified: Secondary | ICD-10-CM | POA: Insufficient documentation

## 2010-07-25 DIAGNOSIS — I824Y9 Acute embolism and thrombosis of unspecified deep veins of unspecified proximal lower extremity: Secondary | ICD-10-CM | POA: Diagnosis present

## 2010-07-25 LAB — PROTIME-INR: Prothrombin Time: 13.8 seconds (ref 11.6–15.2)

## 2010-07-25 LAB — COMPREHENSIVE METABOLIC PANEL
ALT: 13 U/L (ref 0–53)
AST: 17 U/L (ref 0–37)
Alkaline Phosphatase: 90 U/L (ref 39–117)
CO2: 27 mEq/L (ref 19–32)
Calcium: 9.4 mg/dL (ref 8.4–10.5)
Chloride: 100 mEq/L (ref 96–112)
GFR calc Af Amer: >60 mL/min (ref >60)
GFR calc non Af Amer: >60 mL/min (ref >60)
Glucose, Bld: 137 mg/dL — ABNORMAL HIGH (ref 70–99)
Potassium: 3.4 mEq/L — ABNORMAL LOW (ref 3.5–5.1)
Sodium: 138 mEq/L (ref 135–145)
Total Bilirubin: 0.5 mg/dL (ref 0.3–1.2)

## 2010-07-25 LAB — CBC
Hemoglobin: 14.8 g/dL (ref 13.0–17.0)
MCH: 31.5 pg (ref 26.0–34.0)
MCHC: 33.6 g/dL (ref 30.0–36.0)
MCV: 93.6 fL (ref 78.0–100.0)
RBC: 4.7 MIL/uL (ref 4.22–5.81)

## 2010-07-25 LAB — DIFFERENTIAL
Basophils Relative: 0 % (ref 0–1)
Eosinophils Absolute: 0.5 10*3/uL (ref 0.0–0.7)
Lymphs Abs: 2.1 10*3/uL (ref 0.7–4.0)
Monocytes Absolute: 0.9 10*3/uL (ref 0.1–1.0)
Monocytes Relative: 8 % (ref 3–12)
Neutro Abs: 7.7 10*3/uL (ref 1.7–7.7)

## 2010-07-25 MED ORDER — IOHEXOL 300 MG/ML  SOLN
125.0000 mL | Freq: Once | INTRAMUSCULAR | Status: AC | PRN
  Administered 2010-07-25: 125 mL via INTRAVENOUS

## 2010-07-25 NOTE — Progress Notes (Signed)
[name] Date of Birth:  Jan 04, 1933 [facility] [address]   [address] [place], [place]  [address] [phone]           Fax   [phone]  History of Present Illness: This 77 year old Caucasian male is seen for the first time today at the request of Dr. Windle Guard.  The patient is being evaluated for exertional dyspnea.  His symptoms began 2 days ago.  He noted that he had a resting regular tachycardia of 120 per minute 2 days ago.  He has not been expressing any chest pain.  He saw Dr. Jeannetta Nap in the office yesterday and his EKG showed some new T-wave inversion in the anteroseptal leads.  A chest x-ray done at Dr. Jeannetta Nap office yesterday was unremarkable.  The patient has not been having orthopnea or pedal edema.  The patient did have a right hip replacement on 04/08/10 at [facility] by Dr. Charlann Boxer.  The patient has a past history of essential hypertension but had not been on any medication for his heart or blood pressure until 3 days ago when he started taking hydrochlorothiazide again.  The patient has a family history of possible coronary disease in his father who died of a stroke.  There is a questionable personal history of elevated cholesterol.  The patient has also been complaining of extreme fatigue.  Current Outpatient Prescriptions  Medication Sig Dispense Refill  . hydrochlorothiazide 25 MG tablet Take 25 mg by mouth daily.        Marland Kitchen zolpidem (AMBIEN) 5 MG tablet Take 5 mg by mouth at bedtime as needed.          Allergies  Allergen Reactions  . Codeine     NV    Patient Active Problem List  Diagnoses  . Glaucoma  . Back pain  . Skin cancer  . Insomnia  . Dyspnea    History  Smoking status  . Former Smoker -- 1.0 packs/day for 25 years  . Types: Cigarettes  . Quit date: 03/02/1981  Smokeless tobacco  . Former Neurosurgeon  . Types: Chew  . Quit date: 03/02/2008    History  Alcohol Use  . Yes    wine 3-4 times a week     Family History  Problem Relation Age of Onset  . Stroke Father   . Alzheimer's disease Mother     Review of Systems: Constitutional: no fever chills diaphoresis or fatigue or change in weight.  Head and neck: no hearing loss, no epistaxis, no photophobia or visual disturbance. Respiratory: No cough,Severe exertional dyspnea for the past 2 days. Cardiovascular: No chest pain peripheral edema, palpitations. Gastrointestinal: No abdominal distention, no abdominal pain, no change in bowel habits hematochezia or melena. Genitourinary: No dysuria, no frequency, no urgency, no nocturia. Musculoskeletal:No arthralgias, no back pain, no gait disturbance or myalgias. Neurological: No dizziness, no headaches, no numbness, no seizures, no syncope, no weakness, no tremors. Hematologic: No lymphadenopathy, no easy bruising. Psychiatric: No confusion, no hallucinations, no sleep disturbance.    Physical Exam: Filed Vitals:   07/25/10 1314  BP: 124/84  Pulse: 100  The general appearance reveals a well-developed well-nourished gentleman who is dyspnea with moving from the chair to the bed.  His oxygen saturation on room air varies between 86% and 91%.  His color is good and he does not appear anemic.Pupils equal and reactive.   Extraocular Movements are full.  There is no scleral icterus.  The mouth  and pharynx are normal.  The neck is supple.  The carotids reveal no bruits.  The jugular venous pressure is normal.  The thyroid is not enlarged.  There is no lymphadenopathy.The chest is clear to percussion and auscultation. There are no rales or rhonchi. Expansion of the chest is symmetrical.  The heart reveals a suspected gallop.  I don't hear any murmur or rub.The abdomen is soft and nontender. Bowel sounds are normal. The liver and spleen are not enlarged. There Are no abdominal masses. There are no bruits.  Extremities reveal no phlebitis or edema.Strength is normal and symmetrical in all  extremities.  There is no lateralizing weakness.  There are no sensory deficits.  Musculoskeletal reveals that he walks with a limp and he is still having some problems from the right hip.  EKG shows sinus tachycardia and left axis deviation and an S1 q.3 pattern but the T-wave inversions seen yesterday are no longer present.   Assessment / Plan: I am concerned that the patient may have had pulmonary emboli.  We are sending him to [facility] imaging for CT angiogram to rule out pulmonary emboli.  If no pulmonary emboli and we will proceed with further cardiac workup including echocardiogram and probable nuclear stress test.  We also need to check baseline lab work.  Labs were obtained yesterday at Dr. Jeannetta Nap office but we do not have those results yet.

## 2010-07-25 NOTE — Assessment & Plan Note (Signed)
This patient is seen for recent onset of dyspnea.  He denies chest pain.  He is mildly dyspneic at rest and very dyspneic with activity.  He saw Dr. Jeannetta Nap yesterday who noted some new EKG abnormalities.  The patient has noted tachycardia for the past 48 hours.  Recently he has been very fatigued.  He does not have any history of known coronary disease.  He has had a past history of elevated blood pressure and he restarted his hydrochlorothiazide 3 days ago and a dose of 25 mg daily.  He has a history of having had a right hip replacement on 04/08/10 by Dr. Charlann Boxer.He has not been expressing any chills or fever.  He's had no hemoptysis.

## 2010-07-26 DIAGNOSIS — I2699 Other pulmonary embolism without acute cor pulmonale: Secondary | ICD-10-CM

## 2010-07-26 DIAGNOSIS — I369 Nonrheumatic tricuspid valve disorder, unspecified: Secondary | ICD-10-CM

## 2010-07-26 HISTORY — PX: TRANSTHORACIC ECHOCARDIOGRAM: SHX275

## 2010-07-26 LAB — LIPID PANEL
HDL: 55 mg/dL (ref >39)
LDL Cholesterol: 99 mg/dL (ref 0–99)
Total CHOL/HDL Ratio: 3.1 RATIO
VLDL: 17 mg/dL (ref 0–40)

## 2010-07-26 LAB — TSH: TSH: 0.922 u[IU]/mL (ref 0.350–4.500)

## 2010-07-26 LAB — HEPARIN LEVEL (UNFRACTIONATED)
Heparin Unfractionated: 0.39 IU/mL (ref 0.30–0.70)
Heparin Unfractionated: 0.43 IU/mL (ref 0.30–0.70)

## 2010-07-27 DIAGNOSIS — I2699 Other pulmonary embolism without acute cor pulmonale: Secondary | ICD-10-CM

## 2010-07-27 LAB — CBC
HCT: 40.4 % (ref 39.0–52.0)
MCHC: 33.4 g/dL (ref 30.0–36.0)
Platelets: 137 10*3/uL — ABNORMAL LOW (ref 150–400)
RDW: 13.2 % (ref 11.5–15.5)
WBC: 8.7 10*3/uL (ref 4.0–10.5)

## 2010-07-27 LAB — PROTIME-INR
INR: 1.1 (ref 0.00–1.49)
Prothrombin Time: 14.4 seconds (ref 11.6–15.2)

## 2010-07-28 LAB — CBC
HCT: 40.8 % (ref 39.0–52.0)
MCHC: 33.8 g/dL (ref 30.0–36.0)
RDW: 13.1 % (ref 11.5–15.5)

## 2010-07-28 LAB — PROTIME-INR: Prothrombin Time: 16.7 seconds — ABNORMAL HIGH (ref 11.6–15.2)

## 2010-07-29 LAB — CBC
HCT: 39.9 % (ref 39.0–52.0)
Hemoglobin: 13.5 g/dL (ref 13.0–17.0)
MCH: 31.2 pg (ref 26.0–34.0)
MCHC: 33.8 g/dL (ref 30.0–36.0)

## 2010-07-29 LAB — BASIC METABOLIC PANEL
CO2: 29 mEq/L (ref 19–32)
Glucose, Bld: 91 mg/dL (ref 70–99)
Potassium: 3.9 mEq/L (ref 3.5–5.1)
Sodium: 140 mEq/L (ref 135–145)

## 2010-07-29 LAB — HEPARIN LEVEL (UNFRACTIONATED): Heparin Unfractionated: 0.59 IU/mL (ref 0.30–0.70)

## 2010-07-30 LAB — BASIC METABOLIC PANEL
CO2: 25 mEq/L (ref 19–32)
GFR calc non Af Amer: >60 mL/min (ref >60)
Glucose, Bld: 97 mg/dL (ref 70–99)
Potassium: 3.7 mEq/L (ref 3.5–5.1)
Sodium: 138 mEq/L (ref 135–145)

## 2010-07-30 LAB — CBC
HCT: 39.7 % (ref 39.0–52.0)
Hemoglobin: 13.5 g/dL (ref 13.0–17.0)
MCH: 31.1 pg (ref 26.0–34.0)
MCHC: 34 g/dL (ref 30.0–36.0)

## 2010-07-31 ENCOUNTER — Inpatient Hospital Stay (HOSPITAL_COMMUNITY): Payer: Medicare Other

## 2010-07-31 LAB — PROTIME-INR: Prothrombin Time: 25.4 seconds — ABNORMAL HIGH (ref 11.6–15.2)

## 2010-07-31 LAB — CBC
HCT: 40.2 % (ref 39.0–52.0)
Hemoglobin: 13.9 g/dL (ref 13.0–17.0)
MCH: 31.4 pg (ref 26.0–34.0)
MCHC: 34.6 g/dL (ref 30.0–36.0)

## 2010-08-01 LAB — BASIC METABOLIC PANEL
CO2: 25 mEq/L (ref 19–32)
Chloride: 102 mEq/L (ref 96–112)
Creatinine, Ser: 0.62 mg/dL (ref 0.4–1.5)
GFR calc Af Amer: >60 mL/min (ref >60)
Potassium: 3.9 mEq/L (ref 3.5–5.1)

## 2010-08-01 LAB — CBC
Hemoglobin: 14.4 g/dL (ref 13.0–17.0)
MCH: 31.2 pg (ref 26.0–34.0)
RBC: 4.61 MIL/uL (ref 4.22–5.81)
WBC: 7.9 10*3/uL (ref 4.0–10.5)

## 2010-08-01 LAB — HEPARIN LEVEL (UNFRACTIONATED): Heparin Unfractionated: 0.38 IU/mL (ref 0.30–0.70)

## 2010-08-01 LAB — PROTIME-INR: INR: 2.27 — ABNORMAL HIGH (ref 0.00–1.49)

## 2010-08-07 NOTE — Discharge Summary (Addendum)
[name], [name]                  ACCOUNT NO.:  [id number]  MEDICAL RECORD NO.:  [id number]           PATIENT TYPE:  I  LOCATION:  2022                         FACILITY:  [facility]  PHYSICIAN:  Cassell Clement, M.D. DATE OF BIRTH:  11-12-1932  DATE OF ADMISSION:  07/25/2010 DATE OF DISCHARGE:  08/01/2010                              DISCHARGE SUMMARY   DISCHARGE DIAGNOSES: 1. Acute deep vein thrombosis and large bilateral pulmonary emboli     diagnosed this admission.     a.     Discharge INR of 2.27. 2. Right hip replacement in February 2012. 3. Glaucoma. 4. Skin cancer. 5. Hypertension. 6. Insomnia. 7. A 3-mm nonspecific pulmonary nodule left upper lobe by CT angio on     Jul 25, 2010, recommend followup CT in 1 year. 8. Ejection fraction of 55% to 60% with signs of cor pulmonale with     right ventricular enlargement and mild septal flattening, question     membrane in right atrium by echocardiogram on Jul 26, 2010,     consider MRI as outpatient clinically indicated to further assess. 9. Bilateral venous Dopplers demonstrating an acute nonocclusive right     deep vein thrombosis in the common and proximal femoral veins.  HOSPITAL COURSE:  [name] is a 77 year old gentleman with no prior cardiac history but history of hypertension and recent hip surgery who was referred to Dr. Yevonne Pax office for the evaluation of exertional dyspnea.  His symptoms began 2 days prior to evaluation and he developed a resting regular tachycardia of 120 beats per minute.  In the office, his heart rate was 100.  His EKG showed new T-wave inversion in the anteroseptal leads at Dr. Jeannetta Nap' office, and a chest x-ray was unremarkable at that visit.  He was subsequently seen by Dr. Patty Sermons, and given his recent history of hip replacement, there was a strong concern for pulmonary emboli.  He was sent over to [facility] to have a CT angio which did in fact demonstrate large bilateral PEs  with nonspecific pulmonary nodule in the left upper lobe measuring 3 mm.  He was subsequently started on heparin to Coumadin.  A 2-D echocardiogram was checked demonstrating a normal EF, but did note the size of cor pulmonale with RV enlargement and mild septal flattening.  The patient had bilateral venous Dopplers which did show an acute nonocclusive DVT in right common and proximal femoral vein.  His blood pressure was mildly elevated this admission, so losartan was started in place of his hydrochlorothiazide.  He was also supplemented with p.o. potassium at low dose.  The patient maintained an INR of greater than 2 for two days and was felt stable for discharge.  He ambulated without difficulty. Dr. Patty Sermons has seen and examined him today and feels he is stable for discharge.  His O2 sats on room air are 96%.  DISCHARGE LABORATORY DATA:  WBC 7.9, hemoglobin 14.4, hematocrit 42.2, platelet count 237.  Sodium 137, potassium 3.9, chloride 102, CO2 25, glucose 76, BUN 12, creatinine 0.62, INR 2.27 at discharge.  STUDIES: 1. CT angio on Jul 25, 2010, demonstrated positive large bilateral     pulmonary emboli.  Nonspecific pulmonary nodule in left upper lobe     measuring 3 mm.  Small area of peripheral subpleural consolidation     in the left lower lobe, favoring area of pulmonary infarct. 2. Chest x-ray on Jul 31, 2010, showed no radiographic abnormality in     this patient with known pulmonary emboli.  No evidence of pulmonary     infarction by plain films. 3. A 2-D echocardiogram on Jul 26, 2010 showed moderate LVH.  EF 55%     to 60%.  PA pressure was 61 mmHg.  There were signs of cor     pulmonale with RV enlargement and mild septal flattening.  PCR     signal 4 but PA pressures were elevated.  Question of membrane in     RA.  Consider MRI equivocally elevated to further assess.  DISCHARGE MEDICATIONS: 1. Aspirin 81 mg daily. 2. Coumadin 10 mg daily for now.  He will have his  first INR checked     in August 04, 2010. 3. Losartan 100 mg daily. 4. Potassium chloride 20 mEq daily. 5. Doxepin 75 mg daily at bedtime. 6. Ferrous sulfate 325 mg t.i.d. 7. Glucosamine/chondroitin 2 tablets daily at bedtime. 8. L-arginine 1000 mg b.i.d. 9. Lumigan eye drops 0.3% one drop both eyes daily at bedtime. 10.Red yeast rice 60 mg 2 capsules daily. 11.Robaxin 500 mg q.6 h. p.r.n. muscle spasm. 12.Vitamin C 1000 mg daily. 13.Vitamin E 200 units daily. 14.Zolpidem 5 mg daily at bedtime p.r.n.  Hydrochlorothiazide was discontinued this admission.  He is not instructed to stop his Voltaren given the risks of bleeding while being on aspirin and Coumadin.  DISPOSITION:  [name] will be discharged in stable condition to home. He is instructed to follow a low-sodium, heart-healthy diet and keep his IV site clean and dry.  Dr. Patty Sermons has told him that he can drive. He will Norma Fredrickson, NP, for his first postoperative visit with the cardiologist on August 15, 2010, at 10 a.m.  He will have his first INR checked on August 04, 2010, at 9 a.m. at Dr. Jeannetta Nap' office.  DURATION OF DISCHARGE ENCOUNTER:  Greater than 30 minutes including physician and PA time.     [name], P.A.C.   ______________________________ Cassell Clement, M.D.    DD/MEDQ  D:  08/01/2010  T:  08/01/2010  Job:  [id number]  cc:   Windle Guard, M.D.  Electronically Signed by Cassell Clement M.D. on 08/07/2010 08:30:01 AM Electronically Signed by [name]  on 08/14/2010 09:25:16 AM

## 2010-08-12 ENCOUNTER — Encounter: Payer: Self-pay | Admitting: Nurse Practitioner

## 2010-08-15 ENCOUNTER — Ambulatory Visit (INDEPENDENT_AMBULATORY_CARE_PROVIDER_SITE_OTHER): Payer: Medicare Other | Admitting: Nurse Practitioner

## 2010-08-15 ENCOUNTER — Encounter: Payer: Self-pay | Admitting: Nurse Practitioner

## 2010-08-15 VITALS — BP 110/82 | HR 72 | Ht 72.0 in | Wt 218.6 lb

## 2010-08-15 DIAGNOSIS — I279 Pulmonary heart disease, unspecified: Secondary | ICD-10-CM

## 2010-08-15 DIAGNOSIS — I2781 Cor pulmonale (chronic): Secondary | ICD-10-CM | POA: Insufficient documentation

## 2010-08-15 DIAGNOSIS — J984 Other disorders of lung: Secondary | ICD-10-CM

## 2010-08-15 DIAGNOSIS — I2699 Other pulmonary embolism without acute cor pulmonale: Secondary | ICD-10-CM

## 2010-08-15 DIAGNOSIS — R911 Solitary pulmonary nodule: Secondary | ICD-10-CM | POA: Insufficient documentation

## 2010-08-15 DIAGNOSIS — Z7901 Long term (current) use of anticoagulants: Secondary | ICD-10-CM

## 2010-08-15 NOTE — Assessment & Plan Note (Signed)
He is doing well on his coumadin. I cautioned him against using NSAIDs. He is to use Tylenol instead.

## 2010-08-15 NOTE — Assessment & Plan Note (Signed)
We may need to repeat in the future. I will defer the repeat to Dr. Patty Sermons.

## 2010-08-15 NOTE — Patient Instructions (Signed)
Stay on your current medicines. Continue to check your coumadin levels regularly We will see you in 3 months Avoid Ibuprofen and use Tylenol if needed for pain Call for any problems.
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
## 2010-08-15 NOTE — Assessment & Plan Note (Signed)
This will need follow up in 1 year.

## 2010-08-15 NOTE — Assessment & Plan Note (Signed)
He remains on his coumadin. I told him I thought he would need to stay on his anticoagulation for about 3 to 6 months. I will have him see Dr. Patty Sermons back in 3 months. Patient is agreeable to this plan and will call if any problems develop in the interim.

## 2010-11-18 ENCOUNTER — Ambulatory Visit (INDEPENDENT_AMBULATORY_CARE_PROVIDER_SITE_OTHER): Payer: Medicare Other | Admitting: Cardiology

## 2010-11-18 ENCOUNTER — Encounter: Payer: Self-pay | Admitting: Cardiology

## 2010-11-18 DIAGNOSIS — I2699 Other pulmonary embolism without acute cor pulmonale: Secondary | ICD-10-CM

## 2010-11-18 DIAGNOSIS — M1990 Unspecified osteoarthritis, unspecified site: Secondary | ICD-10-CM | POA: Insufficient documentation

## 2010-11-18 DIAGNOSIS — Z7901 Long term (current) use of anticoagulants: Secondary | ICD-10-CM

## 2010-11-18 NOTE — Assessment & Plan Note (Signed)
The patient has not been experiencing any pleurisy.  He denies any chest pain or shortness of breath.  He's not been aware of any palpitations.

## 2010-11-18 NOTE — Assessment & Plan Note (Signed)
The patient has been getting his prothrombin times done on a regular basis at Dr. Jeannetta Nap office and the patient reports that he has been staying within the anticipated therapeutic range of 2.0-3.0

## 2010-11-18 NOTE — Progress Notes (Signed)
[name] Date of Birth:  10-25-1932 [facility] [address]   [address] [place], [place]  [address] [phone]           Fax   [phone]  HPI: This pleasant 77 year old gentleman is seen for a scheduled followup office visit.  He has a past history of pulmonary emboli.  He underwent right hip replacement in February 2012.  3 months after his hip surgery he had bilateral pulmonary emboli.  He also had extensive DVT.  He has been on Coumadin since may 2012.  Our goal is to keep him on Coumadin for 6 months and then switch to aspirin.  The patient's left ventricular function by echo during the previous admission showed a normal left ventricular ejection fraction with signs of cor pulmonale with right ventricular enlargement.  The patient also had a 3 mm nonspecific nodule in the left upper lobe by CT angina that will need followup In about May 2013.  Current Outpatient Prescriptions  Medication Sig Dispense Refill  . Ascorbic Acid (VITAMIN C) 1000 MG tablet Take 1,000 mg by mouth daily.        . bimatoprost (LUMIGAN) 0.03 % ophthalmic solution Place 1 drop into both eyes at bedtime.        . Celecoxib (CELEBREX PO) Take by mouth daily.        Marland Kitchen doxepin (SINEQUAN) 75 MG capsule Take 75 mg by mouth at bedtime.        Marland Kitchen glucosamine-chondroitin 500-400 MG tablet Take 2 tablets by mouth daily.        . potassium chloride SA (K-DUR,KLOR-CON) 20 MEQ tablet Take 20 mEq by mouth daily.        . Red Yeast Rice Extract (RED YEAST RICE PO) Take 60 mg by mouth 2 (two) times daily.        . vitamin E 200 UNIT capsule Take 200 Units by mouth daily.        Marland Kitchen warfarin (COUMADIN) 10 MG tablet Take 10 mg by mouth as directed.        . zolpidem (AMBIEN) 5 MG tablet Take 5 mg by mouth at bedtime as needed.        Marland Kitchen arginine 500 MG tablet Take 1,000 mg by mouth 2 (two) times daily.        . ferrous sulfate 325 (65 FE) MG tablet Take 2,000 mg by mouth daily with breakfast.         . losartan (COZAAR) 100 MG tablet Take 100 mg by mouth daily.          Allergies  Allergen Reactions  . Codeine     NV  . Morphine And Related     Patient Active Problem List  Diagnoses  . Glaucoma  . Back pain  . Skin cancer  . Insomnia  . Dyspnea  . Pulmonary embolism  . Chronic anticoagulation  . Lung nodule  . Cor pulmonale    History  Smoking status  . Former Smoker -- 1.0 packs/day for 25 years  . Types: Cigarettes  . Quit date: 03/02/1981  Smokeless tobacco  . Former Neurosurgeon  . Types: Chew  . Quit date: 03/02/2008    History  Alcohol Use  . Yes    wine 3-4 times a week    Family History  Problem Relation Age of Onset  . Stroke Father   . Alzheimer's disease Mother   . Lung cancer Brother  Review of Systems: The patient denies any heat or cold intolerance.  No weight gain or weight loss.  The patient denies headaches or blurry vision.  There is no cough or sputum production.  The patient denies dizziness.  There is no hematuria or hematochezia.  The patient denies any muscle aches or arthritis.  The patient denies any rash.  The patient denies frequent falling or instability.  There is no history of depression or anxiety.  All other systems were reviewed and are negative.   Physical Exam: Filed Vitals:   11/18/10 1418  BP: 134/82  Pulse: 72  The general appearance reveals a well-developed well-nourished gentleman in no distress.Pupils equal and reactive.   Extraocular Movements are full.  There is no scleral icterus.  The mouth and pharynx are normal.  The neck is supple.  The carotids reveal no bruits.  The jugular venous pressure is normal.  The thyroid is not enlarged.  There is no lymphadenopathy.  The chest is clear to percussion and auscultation. There are no rales or rhonchi. Expansion of the chest is symmetrical.  The precordium is quiet.  The first heart sound is normal.  The second heart sound is physiologically split.  There is no murmur  gallop rub or click.  There is no abnormal lift or heave.  The abdomen is soft and nontender. Bowel sounds are normal. The liver and spleen are not enlarged. There Are no abdominal masses. There are no bruits.  The pedal pulses are good.  There is no phlebitis or edema.  There is no cyanosis or clubbing.  Strength is normal and symmetrical in all extremities.  There is no lateralizing weakness.  There are no sensory deficits.      Assessment / Plan: Continue same medication.  Recheck in 3 months for followup office visit and EKG and at that point we will probably transition him off Coumadin and onto aspirin alone

## 2010-11-18 NOTE — Assessment & Plan Note (Signed)
The patient continues to have a lot of pain and discomfort in the right femur halfway between the hip and the knee.  It causes him to limp.  He does not anticipate any further surgery however

## 2011-02-17 ENCOUNTER — Encounter: Payer: Self-pay | Admitting: Cardiology

## 2011-02-17 ENCOUNTER — Ambulatory Visit (INDEPENDENT_AMBULATORY_CARE_PROVIDER_SITE_OTHER): Payer: Medicare Other | Admitting: Cardiology

## 2011-02-17 VITALS — BP 118/76 | HR 82 | Resp 18 | Ht 71.0 in | Wt 238.5 lb

## 2011-02-17 DIAGNOSIS — I2699 Other pulmonary embolism without acute cor pulmonale: Secondary | ICD-10-CM

## 2011-02-17 DIAGNOSIS — R9389 Abnormal findings on diagnostic imaging of other specified body structures: Secondary | ICD-10-CM | POA: Insufficient documentation

## 2011-02-17 DIAGNOSIS — R0600 Dyspnea, unspecified: Secondary | ICD-10-CM

## 2011-02-17 DIAGNOSIS — R0609 Other forms of dyspnea: Secondary | ICD-10-CM

## 2011-02-17 DIAGNOSIS — I119 Hypertensive heart disease without heart failure: Secondary | ICD-10-CM

## 2011-02-17 DIAGNOSIS — R918 Other nonspecific abnormal finding of lung field: Secondary | ICD-10-CM

## 2011-02-17 DIAGNOSIS — R911 Solitary pulmonary nodule: Secondary | ICD-10-CM

## 2011-02-17 DIAGNOSIS — J984 Other disorders of lung: Secondary | ICD-10-CM

## 2011-02-17 DIAGNOSIS — M1990 Unspecified osteoarthritis, unspecified site: Secondary | ICD-10-CM

## 2011-02-17 NOTE — Patient Instructions (Signed)
Stop your coumadin and two days later start aspirin 325 mg daily  Your physician wants you to follow-up in: 6 months You will receive a reminder letter in the mail two months in advance. If you don't receive a letter, please call our office to schedule the follow-up appointment.

## 2011-02-17 NOTE — Assessment & Plan Note (Signed)
The patient is not having any active pulmonary symptoms.  No hemoptysis or pleuritic chest pain or cough.

## 2011-02-17 NOTE — Assessment & Plan Note (Signed)
Is not having any significant dyspnea at this time.  He has not been aware of any palpitations or tachycardia.

## 2011-02-17 NOTE — Progress Notes (Signed)
[name] Date of Birth:  [birth year] [facility] [address]   [address] [place], [place]  [address] [phone]           Fax   [phone]  History of Present Illness: This pleasant 77 year old gentleman is seen for a three-month followup office visit.  He has a past history of multiple pulmonary emboli.  In February 2012 he underwent right hip replacement by Dr Charlann Boxer.  3 months after the hip surgery he presented with worsening exertional dyspnea and was found to have bilateral pulmonary emboli.  He was hospitalized at [facility] and was treated with IV heparin and then Coumadin.  He has been on Coumadin for more than 6 months.  He's had no recurrent symptoms.  Current Outpatient Prescriptions  Medication Sig Dispense Refill  . arginine 500 MG tablet Take 1,000 mg by mouth 2 (two) times daily.        . Ascorbic Acid (VITAMIN C) 1000 MG tablet Take 1,000 mg by mouth daily.        Marland Kitchen aspirin 325 MG tablet Take 325 mg by mouth daily.        . bimatoprost (LUMIGAN) 0.03 % ophthalmic solution Place 1 drop into both eyes at bedtime.        . Celecoxib (CELEBREX PO) Take by mouth daily.        Marland Kitchen doxepin (SINEQUAN) 75 MG capsule Take 75 mg by mouth at bedtime.        . ferrous sulfate 325 (65 FE) MG tablet Take 2,000 mg by mouth daily with breakfast.       . glucosamine-chondroitin 500-400 MG tablet Take 2 tablets by mouth daily.        Marland Kitchen losartan (COZAAR) 100 MG tablet Take 100 mg by mouth daily.        . potassium chloride SA (K-DUR,KLOR-CON) 20 MEQ tablet Take 20 mEq by mouth daily.        . Red Yeast Rice Extract (RED YEAST RICE PO) Take 60 mg by mouth 2 (two) times daily.        . traMADol (ULTRAM) 50 MG tablet Take 50 mg by mouth every 6 (six) hours as needed.       . vitamin E 200 UNIT capsule Take 200 Units by mouth daily.        Marland Kitchen zolpidem (AMBIEN) 5 MG tablet Take 5 mg by mouth at bedtime as needed.          Allergies  Allergen Reactions  . Codeine     NV  . Morphine And Related     Patient Active Problem List  Diagnoses  . Glaucoma  . Back pain  . Skin cancer  . Insomnia  . Dyspnea  . Pulmonary embolism  . Chronic anticoagulation  . Lung nodule  . Cor pulmonale  . Osteoarthritis    History  Smoking status  . Former Smoker -- 1.0 packs/day for 25 years  . Types: Cigarettes  . Quit date: 03/02/1981  Smokeless tobacco  . Former Neurosurgeon  . Types: Chew  . Quit date: 03/02/2008    History  Alcohol Use  . Yes    wine 3-4 times a week    Family History  Problem Relation Age of Onset  . Stroke Father   . Alzheimer's disease Mother   . Lung cancer Brother     Review of Systems: Constitutional: no fever chills diaphoresis or fatigue or change in weight.  Head  and neck: no hearing loss, no epistaxis, no photophobia or visual disturbance. Respiratory: No cough, shortness of breath or wheezing. Cardiovascular: No chest pain peripheral edema, palpitations. Gastrointestinal: No abdominal distention, no abdominal pain, no change in bowel habits hematochezia or melena. Genitourinary: No dysuria, no frequency, no urgency, no nocturia. Musculoskeletal:No arthralgias, no back pain, no gait disturbance or myalgias. Neurological: No dizziness, no headaches, no numbness, no seizures, no syncope, no weakness, no tremors. Hematologic: No lymphadenopathy, no easy bruising. Psychiatric: No confusion, no hallucinations, no sleep disturbance.    Physical Exam: Filed Vitals:   02/17/11 1601  BP: 118/76  Pulse: 82  Resp: 18   the general appearance reveals a well-developed well-nourished gentleman in no distress.Pupils equal and reactive.   Extraocular Movements are full.  There is no scleral icterus.  The mouth and pharynx are normal.  The neck is supple.  The carotids reveal no bruits.  The jugular venous pressure is normal.  The thyroid is not enlarged.  There is no lymphadenopathy.  The chest is clear to percussion and  auscultation. There are no rales or rhonchi. Expansion of the chest is symmetrical.  The precordium is quiet.  The first heart sound is normal.  The second heart sound is physiologically split.  There is no murmur gallop rub or click.  There is no abnormal lift or heave.  The abdomen is soft and nontender. Bowel sounds are normal. The liver and spleen are not enlarged. There Are no abdominal masses. There are no bruits.  The pedal pulses are good.  There is no phlebitis or edema.  There is no cyanosis or clubbing. Strength is normal and symmetrical in all extremities.  There is no lateralizing weakness.  There are no sensory deficits.  The skin is warm and dry.  There is no rash.  His EKG shows normal sinus rhythm and no ischemic changes.   Assessment / Plan: At this point his Coumadin can be stopped and he was started take aspirin 325 mg one daily in its place.  Asked to see him again in about 6 months for followup.  The patient has also gained significant weight and he needs to work harder on careful diet and weight loss.  He needs to be on a low-sodium diet because of blood pressure.

## 2011-02-17 NOTE — Assessment & Plan Note (Signed)
The patient also having a lot of discomfort in the previously operated right hip.  He intends to go back to see his surgeon soon.

## 2011-02-27 ENCOUNTER — Other Ambulatory Visit: Payer: Self-pay | Admitting: *Deleted

## 2011-02-27 DIAGNOSIS — I119 Hypertensive heart disease without heart failure: Secondary | ICD-10-CM

## 2011-02-27 NOTE — Progress Notes (Signed)
Addended by: Andrey Cota A on: 02/27/2011 03:25 PM   Modules accepted: Orders

## 2011-08-03 ENCOUNTER — Other Ambulatory Visit (HOSPITAL_COMMUNITY): Payer: Self-pay | Admitting: Internal Medicine

## 2012-08-18 ENCOUNTER — Telehealth: Payer: Self-pay | Admitting: *Deleted

## 2012-08-18 NOTE — Telephone Encounter (Signed)
Wants losartan 100 mg refilled. Has not been here in 2 years (02/17/11 last ov) [facility] called 08/18/12 asking for refill. [phone] Weston Brass).

## 2012-08-22 ENCOUNTER — Other Ambulatory Visit: Payer: Self-pay | Admitting: *Deleted

## 2012-08-22 MED ORDER — LOSARTAN POTASSIUM 100 MG PO TABS: ORAL_TABLET | ORAL | Status: DC

## 2012-08-22 NOTE — Telephone Encounter (Signed)
Spoke with patient and he never received letter or call for follow up ov. Patient denies any problems and has been seeing his PCP yearly. Scheduled ov and refilled Losartan as requested

## 2012-09-23 ENCOUNTER — Ambulatory Visit (INDEPENDENT_AMBULATORY_CARE_PROVIDER_SITE_OTHER): Payer: Medicare Other | Admitting: Cardiology

## 2012-09-23 ENCOUNTER — Encounter: Payer: Self-pay | Admitting: Cardiology

## 2012-09-23 VITALS — BP 128/88 | HR 73 | Ht 71.0 in | Wt 242.0 lb

## 2012-09-23 DIAGNOSIS — I2699 Other pulmonary embolism without acute cor pulmonale: Secondary | ICD-10-CM

## 2012-09-23 DIAGNOSIS — R0609 Other forms of dyspnea: Secondary | ICD-10-CM

## 2012-09-23 DIAGNOSIS — I119 Hypertensive heart disease without heart failure: Secondary | ICD-10-CM

## 2012-09-23 DIAGNOSIS — M1990 Unspecified osteoarthritis, unspecified site: Secondary | ICD-10-CM

## 2012-09-23 DIAGNOSIS — R0600 Dyspnea, unspecified: Secondary | ICD-10-CM

## 2012-09-23 NOTE — Progress Notes (Signed)
[name] Date of Birth:  Feb 04, 1933 [facility] [address] [place], [place]  [address] [phone]         Fax   [phone]  History of Present Illness: This pleasant 79 year old gentleman is seen for a  followup office visit.  He was last seen here on 02/17/11. He has a past history of multiple pulmonary emboli. In February 2012 he underwent right hip replacement by Dr Charlann Boxer. 3 months after the hip surgery he presented with worsening exertional dyspnea and was found to have bilateral pulmonary emboli. He was hospitalized at [facility] and was treated with IV heparin and then Coumadin.  He was treated with Coumadin for 6 months and since then was transitioned to aspirin 325 mg daily. He's had no recurrent symptoms.   Current Outpatient Prescriptions  Medication Sig Dispense Refill  . arginine 500 MG tablet Take 1,000 mg by mouth 2 (two) times daily.        . Ascorbic Acid (VITAMIN C) 1000 MG tablet Take 1,000 mg by mouth daily.        Marland Kitchen aspirin 325 MG tablet Take 325 mg by mouth daily.        . bimatoprost (LUMIGAN) 0.03 % ophthalmic solution Place 1 drop into both eyes at bedtime.        . Celecoxib (CELEBREX PO) Take by mouth as needed.       . ferrous sulfate 325 (65 FE) MG tablet Take 2,000 mg by mouth daily with breakfast.       . glucosamine-chondroitin 500-400 MG tablet Take 2 tablets by mouth daily.        Marland Kitchen losartan (COZAAR) 100 MG tablet take 1 tablet by mouth once daily  30 tablet  1  . Red Yeast Rice Extract (RED YEAST RICE PO) Take 60 mg by mouth 2 (two) times daily.        . traMADol (ULTRAM) 50 MG tablet Take 50 mg by mouth every 6 (six) hours as needed.       . vitamin E 200 UNIT capsule Take 200 Units by mouth daily.        Marland Kitchen zolpidem (AMBIEN) 5 MG tablet Take 5 mg by mouth at bedtime as needed.        . doxepin (SINEQUAN) 75 MG capsule Take 75 mg by mouth at bedtime.        . potassium chloride SA (K-DUR,KLOR-CON) 20 MEQ tablet Take 20 mEq by  mouth daily.         No current facility-administered medications for this visit.    Allergies  Allergen Reactions  . Codeine     NV  . Morphine And Related     Patient Active Problem List   Diagnosis Date Noted  . Dyspnea 07/25/2010    Priority: High  . Abnormal chest x-ray 02/17/2011  . Osteoarthritis 11/18/2010  . Pulmonary embolism 08/15/2010  . Chronic anticoagulation 08/15/2010  . Lung nodule 08/15/2010  . Cor pulmonale 08/15/2010  . Glaucoma   . Back pain   . Skin cancer   . Insomnia     History  Smoking status  . Former Smoker -- 1.00 packs/day for 25 years  . Types: Cigarettes  . Quit date: 03/02/1981  Smokeless tobacco  . Former Neurosurgeon  . Types: Chew  . Quit date: 03/02/2008    History  Alcohol Use  . Yes    Comment: wine 3-4 times a week    Family History  Problem  Relation Age of Onset  . Stroke Father   . Alzheimer's disease Mother   . Lung cancer Brother     Review of Systems: Constitutional: no fever chills diaphoresis or fatigue or change in weight.  Head and neck: no hearing loss, no epistaxis, no photophobia or visual disturbance. Respiratory: No cough, shortness of breath or wheezing. Cardiovascular: No chest pain peripheral edema, palpitations. Gastrointestinal: No abdominal distention, no abdominal pain, no change in bowel habits hematochezia or melena. Genitourinary: No dysuria, no frequency, no urgency, no nocturia. Musculoskeletal:No arthralgias, no back pain, no gait disturbance or myalgias. Neurological: No dizziness, no headaches, no numbness, no seizures, no syncope, no weakness, no tremors. Hematologic: No lymphadenopathy, no easy bruising. Psychiatric: No confusion, no hallucinations, no sleep disturbance.    Physical Exam: Filed Vitals:   09/23/12 1436  BP: 128/88  Pulse: 73   the general appearance reveals a well-developed well-nourished gentleman in no distress.The head and neck exam reveals pupils equal and  reactive.  Extraocular movements are full.  There is no scleral icterus.  The mouth and pharynx are normal.  The neck is supple.  The carotids reveal no bruits.  The jugular venous pressure is normal.  The  thyroid is not enlarged.  There is no lymphadenopathy.  The chest is clear to percussion and auscultation.  There are no rales or rhonchi.  Expansion of the chest is symmetrical.  The precordium is quiet.  The first heart sound is normal.  The second heart sound is physiologically split.  There is no murmur gallop rub or click.  There is no abnormal lift or heave.  The abdomen is soft and nontender.  The bowel sounds are normal.  The liver and spleen are not enlarged.  There are no abdominal masses.  There are no abdominal bruits.  Extremities reveal good pedal pulses.  There is no phlebitis or edema.  There is no cyanosis or clubbing.  Strength is normal and symmetrical in all extremities.  There is no lateralizing weakness.  There are no sensory deficits.  The skin is warm and dry.  There is no rash.  EKG shows normal sinus rhythm and left axis deviation and since the previous tracing of 02/17/11 unchanged except for a slower heart rate   Assessment / Plan: Continue same medication.  Recheck in one year for followup office visit and EKG.

## 2012-09-23 NOTE — Patient Instructions (Addendum)
Your physician recommends that you continue on your current medications as directed. Please refer to the Current Medication list given to you today.  Your physician wants you to follow-up in: 1 year ov/ekg You will receive a reminder letter in the mail two months in advance. If you don't receive a letter, please call our office to schedule the follow-up appointment.  

## 2012-09-23 NOTE — Assessment & Plan Note (Addendum)
The patient has significant osteoarthritis.  Symptoms have improved on Celebrex
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
## 2012-09-23 NOTE — Assessment & Plan Note (Signed)
His dyspnea is back to baseline and can be explained by his exogenous obesity and deconditioning

## 2012-11-02 ENCOUNTER — Other Ambulatory Visit: Payer: Self-pay | Admitting: *Deleted

## 2012-11-02 MED ORDER — LOSARTAN POTASSIUM 100 MG PO TABS: ORAL_TABLET | ORAL | Status: DC

## 2012-12-27 ENCOUNTER — Other Ambulatory Visit: Payer: Self-pay | Admitting: *Deleted

## 2012-12-27 MED ORDER — LOSARTAN POTASSIUM 100 MG PO TABS: ORAL_TABLET | ORAL | Status: DC

## 2013-06-27 ENCOUNTER — Other Ambulatory Visit: Payer: Self-pay | Admitting: Cardiology

## 2013-08-28 ENCOUNTER — Other Ambulatory Visit: Payer: Self-pay | Admitting: Cardiology

## 2013-09-25 ENCOUNTER — Ambulatory Visit: Payer: Medicare Other | Admitting: Cardiology

## 2013-10-30 ENCOUNTER — Encounter: Payer: Self-pay | Admitting: Cardiology

## 2013-10-30 ENCOUNTER — Ambulatory Visit (INDEPENDENT_AMBULATORY_CARE_PROVIDER_SITE_OTHER): Payer: Medicare Other | Admitting: Cardiology

## 2013-10-30 VITALS — BP 130/82 | HR 73 | Ht 71.0 in | Wt 249.0 lb

## 2013-10-30 DIAGNOSIS — I2699 Other pulmonary embolism without acute cor pulmonale: Secondary | ICD-10-CM

## 2013-10-30 DIAGNOSIS — R9389 Abnormal findings on diagnostic imaging of other specified body structures: Secondary | ICD-10-CM

## 2013-10-30 DIAGNOSIS — R0989 Other specified symptoms and signs involving the circulatory and respiratory systems: Secondary | ICD-10-CM

## 2013-10-30 DIAGNOSIS — R911 Solitary pulmonary nodule: Secondary | ICD-10-CM

## 2013-10-30 DIAGNOSIS — Z86711 Personal history of pulmonary embolism: Secondary | ICD-10-CM

## 2013-10-30 DIAGNOSIS — R0609 Other forms of dyspnea: Secondary | ICD-10-CM

## 2013-10-30 DIAGNOSIS — R0600 Dyspnea, unspecified: Secondary | ICD-10-CM

## 2013-10-30 NOTE — Patient Instructions (Signed)
Your physician recommends that you continue on your current medications as directed. Please refer to the Current Medication list given to you today.  Your physician wants you to follow-up in: 1 year ov/ekg You will receive a reminder letter in the mail two months in advance. If you don't receive a letter, please call our office to schedule the follow-up appointment.   Work harder on diet and exercise  A chest x-ray takes a picture of the organs and structures inside the chest, including the heart, lungs, and blood vessels. This test can show several things, including, whether the heart is enlarges; whether fluid is building up in the lungs; and whether pacemaker / defibrillator leads are still in place. [place]

## 2013-10-30 NOTE — Assessment & Plan Note (Addendum)
The patient has had no symptoms of recurrent pulmonary emboli.  We are going to update his chest x-ray.  He was a previous smoker but quit smoking about 30 years ago.

## 2013-10-30 NOTE — Assessment & Plan Note (Signed)
We will update his chest x-ray.

## 2013-10-30 NOTE — Progress Notes (Signed)
[name] Date of Birth:  05-22-1932 [facility] [address] [place], [place]  [address] [phone]        Fax   [phone]   History of Present Illness:  This pleasant 80 year old gentleman is seen for a followup office visit.  He has a past history of multiple pulmonary emboli. In February 2012 he underwent right hip replacement by Dr Alvan Dame. 3 months after the hip surgery he presented with worsening exertional dyspnea and was found to have bilateral pulmonary emboli. He was hospitalized at [facility] and was treated with IV heparin and then Coumadin. He was treated with Coumadin for 6 months and since then was transitioned to aspirin 325 mg daily. He's had no recurrent symptoms. The patient has a history of chronic back pain.  He has had 3 previous back operations.  He has been having a lot of muscle spasm and has been taking anti-spasmodic and ibuprofen.  He has not been getting any regular exercise.  He denies any chest pain or shortness of breath.  He has been gaining weight.   Current Outpatient Prescriptions  Medication Sig Dispense Refill  . arginine 500 MG tablet Take 1,000 mg by mouth 2 (two) times daily.        . Ascorbic Acid (VITAMIN C) 1000 MG tablet Take 1,000 mg by mouth daily.        Marland Kitchen aspirin 325 MG tablet Take 325 mg by mouth daily.        . bimatoprost (LUMIGAN) 0.03 % ophthalmic solution Place 1 drop into both eyes at bedtime.        . Celecoxib (CELEBREX PO) Take by mouth as needed.       . ferrous sulfate 325 (65 FE) MG tablet Take 2,000 mg by mouth daily with breakfast.       . glucosamine-chondroitin 500-400 MG tablet Take 2 tablets by mouth daily.        Marland Kitchen latanoprost (XALATAN) 0.005 % ophthalmic solution       . losartan (COZAAR) 100 MG tablet take 1 tablet by mouth once daily  30 tablet  1  . mirtazapine (REMERON) 15 MG tablet       . potassium chloride SA (K-DUR,KLOR-CON) 20 MEQ tablet Take 20 mEq by mouth daily.        . Red Yeast  Rice Extract (RED YEAST RICE PO) Take 60 mg by mouth 2 (two) times daily.        . traMADol (ULTRAM) 50 MG tablet Take 50 mg by mouth every 6 (six) hours as needed.       . vitamin E 200 UNIT capsule Take 200 Units by mouth daily.         No current facility-administered medications for this visit.    Allergies  Allergen Reactions  . Codeine     NV  . Morphine And Related     Patient Active Problem List   Diagnosis Date Noted  . Dyspnea 07/25/2010    Priority: High  . Abnormal chest x-ray 02/17/2011  . Osteoarthritis 11/18/2010  . Pulmonary embolism 08/15/2010  . Chronic anticoagulation 08/15/2010  . Lung nodule 08/15/2010  . Cor pulmonale 08/15/2010  . Glaucoma   . Back pain   . Skin cancer   . Insomnia     History  Smoking status  . Former Smoker -- 1.00 packs/day for 25 years  . Types: Cigarettes  . Quit date: 03/02/1981  Smokeless tobacco  .  Former Systems developer  . Types: Chew  . Quit date: 03/02/2008    History  Alcohol Use  . Yes    Comment: wine 3-4 times a week    Family History  Problem Relation Age of Onset  . Stroke Father   . Alzheimer's disease Mother   . Lung cancer Brother     Review of Systems: Constitutional: no fever chills diaphoresis or fatigue or change in weight.  Head and neck: no hearing loss, no epistaxis, no photophobia or visual disturbance. Respiratory: No cough, shortness of breath or wheezing. Cardiovascular: No chest pain peripheral edema, palpitations. Gastrointestinal: No abdominal distention, no abdominal pain, no change in bowel habits hematochezia or melena. Genitourinary: No dysuria, no frequency, no urgency, no nocturia. Musculoskeletal:No arthralgias, no back pain, no gait disturbance or myalgias. Neurological: No dizziness, no headaches, no numbness, no seizures, no syncope, no weakness, no tremors. Hematologic: No lymphadenopathy, no easy bruising. Psychiatric: No confusion, no hallucinations, no sleep  disturbance.    Physical Exam: Filed Vitals:   10/30/13 1435  BP: 130/82  Pulse: 73  The patient appears to be in no distress.  Head and neck exam reveals that the pupils are equal and reactive.  The extraocular movements are full.  There is no scleral icterus.  Mouth and pharynx are benign.  No lymphadenopathy.  No carotid bruits.  The jugular venous pressure is normal.  Thyroid is not enlarged or tender.  Chest is clear to percussion and auscultation.  No rales or rhonchi.  Expansion of the chest is symmetrical.  Heart reveals no abnormal lift or heave.  First and second heart sounds are normal.  There is no murmur gallop rub or click.  The abdomen is soft and nontender.  Bowel sounds are normoactive.  There is no hepatosplenomegaly or mass.  There are no abdominal bruits.  Extremities reveal no phlebitis or edema.  Pedal pulses are good.  There is no cyanosis or clubbing.  Neurologic exam is normal strength and no lateralizing weakness.  No sensory deficits.  Integument reveals no rash  EKG shows normal sinus rhythm with possible left atrial enlargement and left axis deviation and is unchanged since 09/23/12  Assessment / Plan: 1. remote history of pulmonary emboli 2. mild abnormal exertional dyspnea secondary to lack of regular activity and exercise. 3. chronic low back pain status post 3 previous back operations 4. exogenous obesity  Plan: Continue same medication.  He needs to lose weight and he needs to increase aerobic exercise.  We will update his chest x-ray

## 2013-10-30 NOTE — Assessment & Plan Note (Signed)
He has had some exertional dyspnea which she attributes to being out of shape.  He used to be quite physically active.  Recently he has been more sedentary and his weight is up 7 pounds over the past year

## 2013-10-31 ENCOUNTER — Other Ambulatory Visit: Payer: Self-pay | Admitting: Cardiology

## 2014-10-31 ENCOUNTER — Other Ambulatory Visit: Payer: Self-pay | Admitting: *Deleted

## 2014-10-31 MED ORDER — LOSARTAN POTASSIUM 100 MG PO TABS: 100.0000 mg | ORAL_TABLET | Freq: Every day | ORAL | Status: DC

## 2014-11-29 ENCOUNTER — Other Ambulatory Visit: Payer: Self-pay | Admitting: Cardiology

## 2015-01-11 ENCOUNTER — Ambulatory Visit: Payer: Medicare Other | Admitting: Cardiology

## 2015-01-14 ENCOUNTER — Ambulatory Visit (INDEPENDENT_AMBULATORY_CARE_PROVIDER_SITE_OTHER): Payer: Medicare Other | Admitting: Cardiology

## 2015-01-14 ENCOUNTER — Encounter: Payer: Self-pay | Admitting: Cardiology

## 2015-01-14 VITALS — BP 138/80 | HR 97 | Ht 70.0 in | Wt 243.4 lb

## 2015-01-14 DIAGNOSIS — R911 Solitary pulmonary nodule: Secondary | ICD-10-CM

## 2015-01-14 DIAGNOSIS — R0600 Dyspnea, unspecified: Secondary | ICD-10-CM

## 2015-01-14 DIAGNOSIS — Z86711 Personal history of pulmonary embolism: Secondary | ICD-10-CM | POA: Diagnosis not present

## 2015-01-14 NOTE — Patient Instructions (Signed)
Medication Instructions:  Your physician recommends that you continue on your current medications as directed. Please refer to the Current Medication list given to you today.  Labwork: NONE  Testing/Procedures: A chest x-ray takes a picture of the organs and structures inside the chest, including the heart, lungs, and blood vessels. This test can show several things, including, whether the heart is enlarges; whether fluid is building up in the lungs; and whether pacemaker / defibrillator leads are still in place. [facility]  Follow-Up: Your physician wants you to follow-up in: [place] will receive a reminder letter in the mail two months in advance. If you don't receive a letter, please call our office to schedule the follow-up appointment.  If you need a refill on your cardiac medications before your next appointment, please call your pharmacy.

## 2015-01-14 NOTE — Progress Notes (Signed)
Cardiology Office Note   Date:  01/14/2015   ID:  [name], DOB 1933/02/17, MRN [id number]  PCP:  Gregory Downing, MD  Cardiologist: Darlin Coco MD  Chief Complaint  Patient presents with  . Shortness of Breath      History of Present Illness: [name] is a 81 y.o. male who presents for a one-year follow-up visit This pleasant 81 year old gentleman is seen for a followup office visit. He has a past history of multiple pulmonary emboli. In February 2012 he underwent right hip replacement by Dr Alvan Dame. 3 months after the hip surgery he presented with worsening exertional dyspnea and was found to have bilateral pulmonary emboli. He was hospitalized at [facility] and was treated with IV heparin and then Coumadin. He was treated with Coumadin for 6 months and since then was transitioned to aspirin 325 mg daily. He's had no recurrent symptoms. The patient has a history of chronic back pain. He has had 3 previous back operations. He has been having a lot of muscle spasm and has been taking anti-spasmodic and ibuprofen. He has not been getting any regular exercise. He denies any chest pain or shortness of breath.  In 2012 at the time of his admission for pulmonary emboli his CT scan showed an isolated lung lesion for which follow-up was recommended.  When we saw him last year we arranged for him to have a chest x-ray but he states that he never went for the x-ray.  We will reorder the x-ray for follow-up on the lung lesion. The patient has had intermittent vertigo for the past 5 months.  His medical record indicates that he has meclizine on hand although he was not aware that he was taking anything for his vertigo. The patient has not had any symptoms of recurrent pulmonary emboli.  No pericecal.  No increased shortness of breath.  Past Medical History  Diagnosis Date  . Hypertension   . Glaucoma   . Back pain   . Skin cancer     forehead  . Insomnia   . DVT (deep venous  thrombosis) ([place])   . Pulmonary embolism, bilateral [facility]) May 2012    on coumadin  . Skin cancer     Past Surgical History  Procedure Laterality Date  . Back surgery      x3  . Shoulder surgery      x3  . Total hip arthroplasty      right  . Total hip arthroplasty  2012    Dr. Alvan Dame right hip  . Transthoracic echocardiogram  07/26/2010    Left ventricle: The cavity size was normal. Wall thickness was increased in a pattern of moderate LVH. Systolic function was normal. The estimated ejection fraction was in the range of 55% to 60%     Current Outpatient Prescriptions  Medication Sig Dispense Refill  . arginine 500 MG tablet Take 1,000 mg by mouth 2 (two) times daily.      . Ascorbic Acid (VITAMIN C) 1000 MG tablet Take 1,000 mg by mouth daily.      Marland Kitchen aspirin 325 MG tablet Take 325 mg by mouth daily.      . bimatoprost (LUMIGAN) 0.03 % ophthalmic solution Place 1 drop into both eyes at bedtime.      . Celecoxib (CELEBREX PO) Take by mouth as needed.     . Cholecalciferol (VITAMIN D) 2000 UNITS CAPS Take 2,000 Units by mouth daily.    . cyclobenzaprine (FLEXERIL) 10  MG tablet Take 10 mg by mouth 3 (three) times daily as needed. Muscle spasms  0  . ferrous sulfate 325 (65 FE) MG tablet Take 2,000 mg by mouth daily with breakfast.     . glucosamine-chondroitin 500-400 MG tablet Take 2 tablets by mouth daily.      Marland Kitchen latanoprost (XALATAN) 0.005 % ophthalmic solution     . losartan (COZAAR) 100 MG tablet take 1 tablet by mouth once daily 30 tablet 1  . meclizine (ANTIVERT) 25 MG tablet Take 25 mg by mouth 2 (two) times daily as needed. dizziness    . mirtazapine (REMERON) 30 MG tablet Take 30 mg by mouth at bedtime.    . potassium chloride SA (K-DUR,KLOR-CON) 20 MEQ tablet Take 20 mEq by mouth daily.      . Red Yeast Rice Extract (RED YEAST RICE PO) Take 60 mg by mouth 2 (two) times daily.      . traMADol (ULTRAM) 50 MG tablet Take 50 mg by mouth every 6 (six) hours as needed (pain).       . vitamin E 200 UNIT capsule Take 200 Units by mouth daily.       No current facility-administered medications for this visit.    Allergies:   Codeine and Morphine and related    Social History:  The patient  reports that he quit smoking about 33 years ago. His smoking use included Cigarettes. He has a 25 pack-year smoking history. He quit smokeless tobacco use about 6 years ago. His smokeless tobacco use included Chew. He reports that he drinks alcohol. He reports that he does not use illicit drugs.   Family History:  The patient's family history includes Alzheimer's disease in his mother; Lung cancer in his brother; Stroke in his father.    ROS:  Please see the history of present illness.   Otherwise, review of systems are positive for none.   All other systems are reviewed and negative.    PHYSICAL EXAM: VS:  BP 138/80 mmHg  Pulse 97  Ht 5\' 10"  (1.778 m)  Wt 243 lb 6.4 oz (110.406 kg)  BMI 34.92 kg/m2 , BMI Body mass index is 34.92 kg/(m^2). GEN: Well nourished, well developed, in no acute distress HEENT: normal Neck: no JVD, carotid bruits, or masses Cardiac: RRR; no murmurs, rubs, or gallops,no edema  Respiratory:  clear to auscultation bilaterally, normal work of breathing GI: soft, nontender, nondistended, + BS MS: no deformity or atrophy Skin: warm and dry, no rash Neuro:  Strength and sensation are intact Psych: euthymic mood, full affect   EKG:  EKG is ordered today. The ekg ordered today demonstrates normal sinus rhythm.  Left anterior fascicular block.  Since last tracing of 10/30/13, no significant change.   Recent Labs: No results found for requested labs within last 365 days.    Lipid Panel    Component Value Date/Time   CHOL 171 07/26/2010 0640   TRIG 85 07/26/2010 0640   HDL 55 07/26/2010 0640   CHOLHDL 3.1 07/26/2010 0640   VLDL 17 07/26/2010 0640   LDLCALC  07/26/2010 0640    99        Total Cholesterol/HDL:CHD Risk Coronary Heart Disease Risk  Table                     Men   Women  1/2 Average Risk   3.4   3.3  Average Risk       5.0   4.4  2 X Average Risk   9.6   7.1  3 X Average Risk  23.4   11.0        Use the calculated Patient Ratio above and the CHD Risk Table to determine the patient's CHD Risk.        ATP III CLASSIFICATION (LDL):  <100     mg/dL   Optimal  100-129  mg/dL   Near or Above                    Optimal  130-159  mg/dL   Borderline  160-189  mg/dL   High  >190     mg/dL   Very High      Wt Readings from Last 3 Encounters:  01/14/15 243 lb 6.4 oz (110.406 kg)  10/30/13 249 lb (112.946 kg)  09/23/12 242 lb (109.77 kg)         ASSESSMENT AND PLAN:  1. remote history of pulmonary emboli 2. mild abnormal exertional dyspnea secondary to lack of regular activity and exercise. 3. chronic low back pain status post 3 previous back operations 4. exogenous obesity 5.  Vertigo 6.  History of abnormal chest x-ray with isolated pulmonary nodule.  Current medicines are reviewed at length with the patient today.  The patient does not have concerns regarding medicines.  The following changes have been made:  no change  Labs/ tests ordered today include:   Orders Placed This Encounter  Procedures  . DG Chest 2 View  . EKG 12-Lead     Disposition:   Continue current medication.  Get chest x-ray.  Recheck in one year for office visit and EKG.  Berna Spare MD 01/14/2015 4:26 PM    [place], [place], [place]  [address] Phone: [phone]; Fax: [phone]

## 2015-02-02 ENCOUNTER — Other Ambulatory Visit: Payer: Self-pay | Admitting: Cardiology

## 2016-01-07 ENCOUNTER — Other Ambulatory Visit: Payer: Self-pay

## 2016-01-07 MED ORDER — LOSARTAN POTASSIUM 100 MG PO TABS: 100.0000 mg | ORAL_TABLET | Freq: Every day | ORAL | 0 refills | Status: DC

## 2016-07-17 ENCOUNTER — Other Ambulatory Visit: Payer: Self-pay | Admitting: Cardiovascular Disease

## 2016-08-19 ENCOUNTER — Other Ambulatory Visit: Payer: Self-pay | Admitting: *Deleted

## 2016-08-19 MED ORDER — LOSARTAN POTASSIUM 100 MG PO TABS: 100.0000 mg | ORAL_TABLET | Freq: Every day | ORAL | 0 refills | Status: DC

## 2016-10-04 ENCOUNTER — Other Ambulatory Visit: Payer: Self-pay | Admitting: Cardiovascular Disease

## 2016-10-05 NOTE — Telephone Encounter (Signed)
REFILL 

## 2017-12-25 ENCOUNTER — Other Ambulatory Visit: Payer: Self-pay | Admitting: Otolaryngology

## 2017-12-25 DIAGNOSIS — C4440 Unspecified malignant neoplasm of skin of scalp and neck: Secondary | ICD-10-CM

## 2017-12-30 ENCOUNTER — Ambulatory Visit
Admission: RE | Admit: 2017-12-30 | Discharge: 2017-12-30 | Disposition: A | Discharge status: Home / Self Care | Payer: Medicare Other | Source: Ambulatory Visit | Attending: Otolaryngology | Admitting: Otolaryngology

## 2017-12-30 DIAGNOSIS — C4440 Unspecified malignant neoplasm of skin of scalp and neck: Secondary | ICD-10-CM

## 2017-12-30 MED ORDER — IOPAMIDOL (ISOVUE-300) INJECTION 61%
75.0000 mL | Freq: Once | INTRAVENOUS | Status: AC | PRN
  Administered 2017-12-30: 75 mL via INTRAVENOUS

## 2018-01-13 ENCOUNTER — Other Ambulatory Visit: Payer: Self-pay | Admitting: Otolaryngology

## 2018-01-13 ENCOUNTER — Encounter (HOSPITAL_COMMUNITY): Payer: Self-pay

## 2018-01-13 NOTE — Pre-Procedure Instructions (Signed)
Apollos Tenbrink Deahl  01/13/2018      [facility], [place] [address] Phone: [phone] Fax: [phone]  [facility] [phone] - [address] [place] [facility] [address] AT [place] [address] Phone: [phone] Fax: [phone]    Your procedure is scheduled on January 19, 2018.  Report to [facility] Admitting at 800 AM.  Call this number if you have problems the morning of surgery:  [phone]   Remember:  Do not eat or drink after midnight.    Take these medicines the morning of surgery with A SIP OF WATER -none  Follow your surgeon's instructions on when to hold/resume aspirin.  If no instructions were given call the office to determine how they would like to you take aspirin    7 days prior to surgery STOP taking any Aspirin (unless otherwise instructed by your surgeon), Aleve, Naproxen, Ibuprofen, Motrin, Advil, Goody's, BC's, all herbal medications, fish oil, and all vitamins   Do not wear jewelry  Do not wear lotions, powders, or perfumes, or deodorant.  Men may shave face and neck.  Do not bring valuables to the hospital.  [facility] is not responsible for any belongings or valuables.  Contacts, dentures or bridgework may not be worn into surgery.  Leave your suitcase in the car.  After surgery it may be brought to your room.  For patients admitted to the hospital, discharge time will be determined by your treatment team.  Patients discharged the day of surgery will not be allowed to drive home.    [place]- Preparing For Surgery  Before surgery, you can play an important role. Because skin is not sterile, your skin needs to be as free of germs as possible. You can reduce the number of germs on your skin by washing with CHG (chlorahexidine gluconate) Soap before surgery.  CHG is an antiseptic cleaner  which kills germs and bonds with the skin to continue killing germs even after washing.    Oral Hygiene is also important to reduce your risk of infection.  Remember - BRUSH YOUR TEETH THE MORNING OF SURGERY WITH YOUR REGULAR TOOTHPASTE  Please do not use if you have an allergy to CHG or antibacterial soaps. If your skin becomes reddened/irritated stop using the CHG.  Do not shave (including legs and underarms) for at least 48 hours prior to first CHG shower. It is OK to shave your face.  Please follow these instructions carefully.   1. Shower the NIGHT BEFORE SURGERY and the MORNING OF SURGERY with CHG.   2. If you chose to wash your hair, wash your hair first as usual with your normal shampoo.  3. After you shampoo, rinse your hair and body thoroughly to remove the shampoo.  4. Use CHG as you would any other liquid soap. You can apply CHG directly to the skin and wash gently with a scrungie or a clean washcloth.   5. Apply the CHG Soap to your body ONLY FROM THE NECK DOWN.  Do not use on open wounds or open sores. Avoid contact with your eyes, ears, mouth and genitals (private parts). Wash Face and genitals (private parts)  with your normal soap.  6. Wash thoroughly, paying special attention to the area where your surgery will be performed.  7. Thoroughly rinse your body with warm water from the neck down.  8.  DO NOT shower/wash with your normal soap after using and rinsing off the CHG Soap.  9. Pat yourself dry with a CLEAN TOWEL.  10. Wear CLEAN PAJAMAS to bed the night before surgery, wear comfortable clothes the morning of surgery  11. Place CLEAN SHEETS on your bed the night of your first shower and DO NOT SLEEP WITH PETS.  Day of Surgery:  Do not apply any deodorants/lotions.  Please wear clean clothes to the hospital/surgery center.   Remember to brush your teeth WITH YOUR REGULAR TOOTHPASTE.   Please read over the following fact sheets that you were  given.

## 2018-01-14 ENCOUNTER — Encounter (HOSPITAL_COMMUNITY): Payer: Self-pay

## 2018-01-14 ENCOUNTER — Encounter (HOSPITAL_COMMUNITY)
Admission: RE | Admit: 2018-01-14 | Discharge: 2018-01-14 | Disposition: A | Discharge status: Home / Self Care | Payer: Medicare Other | Source: Ambulatory Visit | Attending: Otolaryngology | Admitting: Otolaryngology

## 2018-01-14 DIAGNOSIS — Z01818 Encounter for other preprocedural examination: Secondary | ICD-10-CM | POA: Insufficient documentation

## 2018-01-14 DIAGNOSIS — I440 Atrioventricular block, first degree: Secondary | ICD-10-CM | POA: Insufficient documentation

## 2018-01-14 DIAGNOSIS — I10 Essential (primary) hypertension: Secondary | ICD-10-CM | POA: Diagnosis not present

## 2018-01-14 DIAGNOSIS — C4440 Unspecified malignant neoplasm of skin of scalp and neck: Secondary | ICD-10-CM | POA: Diagnosis not present

## 2018-01-14 DIAGNOSIS — I444 Left anterior fascicular block: Secondary | ICD-10-CM | POA: Diagnosis not present

## 2018-01-14 HISTORY — DX: Dizziness and giddiness: R42

## 2018-01-14 LAB — BASIC METABOLIC PANEL
Anion gap: 8 (ref 5–15)
BUN: 17 mg/dL (ref 8–23)
CALCIUM: 9 mg/dL (ref 8.9–10.3)
CO2: 22 mmol/L (ref 22–32)
CREATININE: 1.02 mg/dL (ref 0.61–1.24)
Chloride: 106 mmol/L (ref 98–111)
GFR calc Af Amer: >60 mL/min (ref >60)
GFR calc non Af Amer: >60 mL/min (ref >60)
GLUCOSE: 186 mg/dL — AB (ref 70–99)
Potassium: 6 mmol/L — ABNORMAL HIGH (ref 3.5–5.1)
Sodium: 136 mmol/L (ref 135–145)

## 2018-01-14 LAB — CBC
HEMATOCRIT: 43.4 % (ref 39.0–52.0)
Hemoglobin: 14.6 g/dL (ref 13.0–17.0)
MCH: 33.3 pg (ref 26.0–34.0)
MCHC: 33.6 g/dL (ref 30.0–36.0)
MCV: 99.1 fL (ref 80.0–100.0)
Platelets: 303 10*3/uL (ref 150–400)
RBC: 4.38 MIL/uL (ref 4.22–5.81)
RDW: 12.5 % (ref 11.5–15.5)
WBC: 6.9 10*3/uL (ref 4.0–10.5)
nRBC: 0 % (ref 0.0–0.2)

## 2018-01-17 NOTE — Anesthesia Preprocedure Evaluation (Addendum)
Anesthesia Evaluation  Patient identified by MRN, date of birth, ID band Patient awake    Reviewed: Allergy & Precautions, NPO status , Patient's Chart, lab work & pertinent test results  Airway Mallampati: I  TM Distance: >3 FB Neck ROM: Full    Dental   Pulmonary former smoker,    Pulmonary exam normal        Cardiovascular hypertension, Pt. on medications Normal cardiovascular exam     Neuro/Psych    GI/Hepatic   Endo/Other    Renal/GU      Musculoskeletal   Abdominal   Peds  Hematology   Anesthesia Other Findings   Reproductive/Obstetrics                            Anesthesia Physical Anesthesia Plan  ASA: III  Anesthesia Plan: General   Post-op Pain Management:    Induction: Intravenous  PONV Risk Score and Plan: 2 and Ondansetron and Treatment may vary due to age or medical condition  Airway Management Planned: Oral ETT  Additional Equipment:   Intra-op Plan:   Post-operative Plan: Extubation in OR  Informed Consent: I have reviewed the patients History and Physical, chart, labs and discussed the procedure including the risks, benefits and alternatives for the proposed anesthesia with the patient or authorized representative who has indicated his/her understanding and acceptance.     Plan Discussed with: CRNA and Surgeon  Anesthesia Plan Comments: (K+ 6.0 on PAT labs, sample hemolyzed. istat4 ordered for DOS.)       Anesthesia Quick Evaluation

## 2018-01-19 ENCOUNTER — Other Ambulatory Visit: Payer: Self-pay

## 2018-01-19 ENCOUNTER — Inpatient Hospital Stay (HOSPITAL_COMMUNITY): Payer: Medicare Other | Admitting: Physician Assistant

## 2018-01-19 ENCOUNTER — Encounter (HOSPITAL_COMMUNITY)
Admission: RE | Disposition: A | Discharge status: Home / Self Care | Payer: Self-pay | Source: Ambulatory Visit | Attending: Otolaryngology

## 2018-01-19 ENCOUNTER — Inpatient Hospital Stay (HOSPITAL_COMMUNITY)
Admission: RE | Admit: 2018-01-19 | Discharge: 2018-01-24 | DRG: 572 | Disposition: A | Discharge status: Home / Self Care | Payer: Medicare Other | Source: Ambulatory Visit | Attending: Otolaryngology | Admitting: Otolaryngology

## 2018-01-19 ENCOUNTER — Encounter (HOSPITAL_COMMUNITY): Payer: Self-pay | Admitting: *Deleted

## 2018-01-19 DIAGNOSIS — I10 Essential (primary) hypertension: Secondary | ICD-10-CM | POA: Diagnosis present

## 2018-01-19 DIAGNOSIS — Z86718 Personal history of other venous thrombosis and embolism: Secondary | ICD-10-CM

## 2018-01-19 DIAGNOSIS — M549 Dorsalgia, unspecified: Secondary | ICD-10-CM | POA: Diagnosis present

## 2018-01-19 DIAGNOSIS — Z87891 Personal history of nicotine dependence: Secondary | ICD-10-CM | POA: Diagnosis not present

## 2018-01-19 DIAGNOSIS — C4440 Unspecified malignant neoplasm of skin of scalp and neck: Secondary | ICD-10-CM | POA: Diagnosis not present

## 2018-01-19 DIAGNOSIS — Z7982 Long term (current) use of aspirin: Secondary | ICD-10-CM | POA: Diagnosis not present

## 2018-01-19 DIAGNOSIS — Z96641 Presence of right artificial hip joint: Secondary | ICD-10-CM | POA: Diagnosis present

## 2018-01-19 DIAGNOSIS — Z885 Allergy status to narcotic agent status: Secondary | ICD-10-CM

## 2018-01-19 DIAGNOSIS — Z7901 Long term (current) use of anticoagulants: Secondary | ICD-10-CM

## 2018-01-19 DIAGNOSIS — R42 Dizziness and giddiness: Secondary | ICD-10-CM | POA: Diagnosis not present

## 2018-01-19 DIAGNOSIS — Z86711 Personal history of pulmonary embolism: Secondary | ICD-10-CM

## 2018-01-19 DIAGNOSIS — Z79899 Other long term (current) drug therapy: Secondary | ICD-10-CM

## 2018-01-19 DIAGNOSIS — G4700 Insomnia, unspecified: Secondary | ICD-10-CM | POA: Diagnosis present

## 2018-01-19 DIAGNOSIS — H409 Unspecified glaucoma: Secondary | ICD-10-CM | POA: Diagnosis present

## 2018-01-19 HISTORY — PX: RADICAL NECK DISSECTION: SHX2284

## 2018-01-19 HISTORY — PX: EXCISION MASS NECK: SHX6703

## 2018-01-19 LAB — POCT I-STAT 4, (NA,K, GLUC, HGB,HCT)
Glucose, Bld: 119 mg/dL — ABNORMAL HIGH (ref 70–99)
HCT: 41 % (ref 39.0–52.0)
Hemoglobin: 13.9 g/dL (ref 13.0–17.0)
POTASSIUM: 3.5 mmol/L (ref 3.5–5.1)
Sodium: 140 mmol/L (ref 135–145)

## 2018-01-19 SURGERY — DISSECTION, NECK, RADICAL
Anesthesia: General | Site: Neck | Laterality: Right

## 2018-01-19 MED ORDER — ASPIRIN EC 81 MG PO TBEC
81.0000 mg | DELAYED_RELEASE_TABLET | Freq: Every day | ORAL | Status: DC
  Administered 2018-01-19 – 2018-01-24 (×6): 81 mg via ORAL
  Filled 2018-01-19 (×6): qty 1

## 2018-01-19 MED ORDER — LABETALOL HCL 5 MG/ML IV SOLN
INTRAVENOUS | Status: DC | PRN
  Administered 2018-01-19: 5 mg via INTRAVENOUS

## 2018-01-19 MED ORDER — PROPOFOL 10 MG/ML IV BOLUS
INTRAVENOUS | Status: DC | PRN
  Administered 2018-01-19 (×3): 20 mg via INTRAVENOUS
  Administered 2018-01-19: 140 mg via INTRAVENOUS

## 2018-01-19 MED ORDER — LATANOPROST 0.005 % OP SOLN
1.0000 [drp] | Freq: Every day | OPHTHALMIC | Status: DC
  Administered 2018-01-19 – 2018-01-23 (×5): 1 [drp] via OPHTHALMIC
  Filled 2018-01-19: qty 2.5

## 2018-01-19 MED ORDER — PHENYLEPHRINE 40 MCG/ML (10ML) SYRINGE FOR IV PUSH (FOR BLOOD PRESSURE SUPPORT)
PREFILLED_SYRINGE | INTRAVENOUS | Status: AC
  Filled 2018-01-19: qty 10

## 2018-01-19 MED ORDER — SODIUM CHLORIDE 0.9 % IV SOLN
INTRAVENOUS | Status: DC | PRN
  Administered 2018-01-19: 25 ug/min via INTRAVENOUS

## 2018-01-19 MED ORDER — LIDOCAINE-EPINEPHRINE 1 %-1:100000 IJ SOLN
INTRAMUSCULAR | Status: DC | PRN
  Administered 2018-01-19: 20 mL

## 2018-01-19 MED ORDER — MECLIZINE HCL 25 MG PO TABS
25.0000 mg | ORAL_TABLET | Freq: Four times a day (QID) | ORAL | Status: DC | PRN
  Filled 2018-01-19: qty 1

## 2018-01-19 MED ORDER — LIDOCAINE-EPINEPHRINE 1 %-1:100000 IJ SOLN
INTRAMUSCULAR | Status: AC
  Filled 2018-01-19: qty 1

## 2018-01-19 MED ORDER — FENTANYL CITRATE (PF) 250 MCG/5ML IJ SOLN
INTRAMUSCULAR | Status: AC
  Filled 2018-01-19: qty 5

## 2018-01-19 MED ORDER — LABETALOL HCL 5 MG/ML IV SOLN
INTRAVENOUS | Status: AC
  Filled 2018-01-19: qty 4

## 2018-01-19 MED ORDER — ONDANSETRON HCL 4 MG/2ML IJ SOLN
4.0000 mg | INTRAMUSCULAR | Status: DC | PRN
  Administered 2018-01-24: 4 mg via INTRAVENOUS
  Filled 2018-01-19: qty 2

## 2018-01-19 MED ORDER — ROCURONIUM BROMIDE 50 MG/5ML IV SOSY
PREFILLED_SYRINGE | INTRAVENOUS | Status: AC
  Filled 2018-01-19: qty 5

## 2018-01-19 MED ORDER — BACITRACIN ZINC 500 UNIT/GM EX OINT
TOPICAL_OINTMENT | CUTANEOUS | Status: DC | PRN
  Administered 2018-01-19: 1 via TOPICAL

## 2018-01-19 MED ORDER — 0.9 % SODIUM CHLORIDE (POUR BTL) OPTIME
TOPICAL | Status: DC | PRN
  Administered 2018-01-19: 1000 mL

## 2018-01-19 MED ORDER — PHENYLEPHRINE 40 MCG/ML (10ML) SYRINGE FOR IV PUSH (FOR BLOOD PRESSURE SUPPORT)
PREFILLED_SYRINGE | INTRAVENOUS | Status: DC | PRN
  Administered 2018-01-19 (×2): 20 ug via INTRAVENOUS

## 2018-01-19 MED ORDER — ONDANSETRON HCL 4 MG/2ML IJ SOLN
INTRAMUSCULAR | Status: AC
  Filled 2018-01-19: qty 2

## 2018-01-19 MED ORDER — MIRTAZAPINE 15 MG PO TABS
30.0000 mg | ORAL_TABLET | Freq: Every day | ORAL | Status: DC
  Administered 2018-01-19 – 2018-01-23 (×5): 30 mg via ORAL
  Filled 2018-01-19 (×5): qty 2

## 2018-01-19 MED ORDER — HYDROMORPHONE HCL 1 MG/ML IJ SOLN: 0.2500 mg | INTRAMUSCULAR | Status: DC | PRN

## 2018-01-19 MED ORDER — ONDANSETRON HCL 4 MG PO TABS: 4.0000 mg | ORAL_TABLET | ORAL | Status: DC | PRN

## 2018-01-19 MED ORDER — KCL IN DEXTROSE-NACL 20-5-0.45 MEQ/L-%-% IV SOLN
INTRAVENOUS | Status: DC
  Administered 2018-01-19 – 2018-01-22 (×5): via INTRAVENOUS
  Filled 2018-01-19 (×5): qty 1000

## 2018-01-19 MED ORDER — ONDANSETRON HCL 4 MG/2ML IJ SOLN
INTRAMUSCULAR | Status: DC | PRN
  Administered 2018-01-19: 4 mg via INTRAVENOUS

## 2018-01-19 MED ORDER — BACITRACIN ZINC 500 UNIT/GM EX OINT
1.0000 "application " | TOPICAL_OINTMENT | Freq: Three times a day (TID) | CUTANEOUS | Status: DC
  Administered 2018-01-19 – 2018-01-24 (×15): 1 via TOPICAL
  Filled 2018-01-19 (×2): qty 28.35

## 2018-01-19 MED ORDER — ONDANSETRON HCL 4 MG/2ML IJ SOLN: 4.0000 mg | Freq: Once | INTRAMUSCULAR | Status: DC | PRN

## 2018-01-19 MED ORDER — FENTANYL CITRATE (PF) 250 MCG/5ML IJ SOLN
INTRAMUSCULAR | Status: DC | PRN
  Administered 2018-01-19 (×2): 50 ug via INTRAVENOUS
  Administered 2018-01-19 (×4): 100 ug via INTRAVENOUS

## 2018-01-19 MED ORDER — HYDROCODONE-ACETAMINOPHEN 5-325 MG PO TABS
1.0000 | ORAL_TABLET | ORAL | Status: DC | PRN
  Administered 2018-01-20: 1 via ORAL
  Administered 2018-01-21: 2 via ORAL
  Filled 2018-01-19: qty 2
  Filled 2018-01-19: qty 1
  Filled 2018-01-19: qty 2

## 2018-01-19 MED ORDER — CEFAZOLIN SODIUM-DEXTROSE 2-4 GM/100ML-% IV SOLN
2.0000 g | INTRAVENOUS | Status: AC
  Administered 2018-01-19: 2 g via INTRAVENOUS

## 2018-01-19 MED ORDER — LIDOCAINE 2% (20 MG/ML) 5 ML SYRINGE
INTRAMUSCULAR | Status: AC
  Filled 2018-01-19: qty 5

## 2018-01-19 MED ORDER — LOSARTAN POTASSIUM 50 MG PO TABS
100.0000 mg | ORAL_TABLET | Freq: Every day | ORAL | Status: DC
  Administered 2018-01-19 – 2018-01-24 (×6): 100 mg via ORAL
  Filled 2018-01-19 (×6): qty 2

## 2018-01-19 MED ORDER — CEFAZOLIN SODIUM-DEXTROSE 2-4 GM/100ML-% IV SOLN
INTRAVENOUS | Status: AC
  Filled 2018-01-19: qty 100

## 2018-01-19 MED ORDER — LACTATED RINGERS IV SOLN
INTRAVENOUS | Status: DC
  Administered 2018-01-19 (×2): via INTRAVENOUS

## 2018-01-19 MED ORDER — MEPERIDINE HCL 50 MG/ML IJ SOLN: 6.2500 mg | INTRAMUSCULAR | Status: DC | PRN

## 2018-01-19 MED ORDER — SUCCINYLCHOLINE CHLORIDE 20 MG/ML IJ SOLN
INTRAMUSCULAR | Status: DC | PRN
  Administered 2018-01-19: 130 mg via INTRAVENOUS

## 2018-01-19 MED ORDER — VITAMIN D 25 MCG (1000 UNIT) PO TABS
1000.0000 [IU] | ORAL_TABLET | Freq: Every day | ORAL | Status: DC
  Administered 2018-01-20 – 2018-01-24 (×5): 1000 [IU] via ORAL
  Filled 2018-01-19 (×5): qty 1

## 2018-01-19 MED ORDER — LIDOCAINE HCL (CARDIAC) PF 100 MG/5ML IV SOSY
PREFILLED_SYRINGE | INTRAVENOUS | Status: DC | PRN
  Administered 2018-01-19: 100 mg via INTRAVENOUS

## 2018-01-19 MED ORDER — CEFAZOLIN SODIUM-DEXTROSE 2-4 GM/100ML-% IV SOLN
2.0000 g | Freq: Three times a day (TID) | INTRAVENOUS | Status: AC
  Administered 2018-01-19 – 2018-01-20 (×3): 2 g via INTRAVENOUS
  Filled 2018-01-19 (×3): qty 100

## 2018-01-19 MED ORDER — ALBUMIN HUMAN 5 % IV SOLN
INTRAVENOUS | Status: DC | PRN
  Administered 2018-01-19: 13:00:00 via INTRAVENOUS

## 2018-01-19 MED ORDER — BACITRACIN ZINC 500 UNIT/GM EX OINT
TOPICAL_OINTMENT | CUTANEOUS | Status: AC
  Filled 2018-01-19: qty 56.7

## 2018-01-19 MED ORDER — PROPOFOL 10 MG/ML IV BOLUS
INTRAVENOUS | Status: AC
  Filled 2018-01-19: qty 20

## 2018-01-19 SURGICAL SUPPLY — 56 items
APPLIER CLIP 9.375 SM OPEN (CLIP)
ATTRACTOMAT 16X20 MAGNETIC DRP (DRAPES) IMPLANT
BLADE SURG 15 STRL LF DISP TIS (BLADE) ×6 IMPLANT
BLADE SURG 15 STRL SS (BLADE) ×12
CANISTER SUCT 3000ML PPV (MISCELLANEOUS) ×3 IMPLANT
CLEANER TIP ELECTROSURG 2X2 (MISCELLANEOUS) ×3 IMPLANT
CLIP APPLIE 9.375 SM OPEN (CLIP) IMPLANT
CORDS BIPOLAR (ELECTRODE) ×3 IMPLANT
COVER SURGICAL LIGHT HANDLE (MISCELLANEOUS) ×3 IMPLANT
COVER WAND RF STERILE (DRAPES) IMPLANT
CRADLE DONUT ADULT HEAD (MISCELLANEOUS) ×3 IMPLANT
DRAIN CHANNEL 10F 3/8 F FF (DRAIN) ×6 IMPLANT
DRAIN SNY 10 ROU (WOUND CARE) IMPLANT
DRAIN WOUND SNY 15 RND (WOUND CARE) IMPLANT
DRAPE HALF SHEET 40X57 (DRAPES) IMPLANT
DRSG TELFA 3X8 NADH (GAUZE/BANDAGES/DRESSINGS) ×3 IMPLANT
ELECT COATED BLADE 2.86 ST (ELECTRODE) ×3 IMPLANT
ELECT REM PT RETURN 9FT ADLT (ELECTROSURGICAL) ×3
ELECTRODE REM PT RTRN 9FT ADLT (ELECTROSURGICAL) ×1 IMPLANT
EVACUATOR SILICONE 100CC (DRAIN) ×6 IMPLANT
FORCEPS BIPOLAR SPETZLER 8 1.0 (NEUROSURGERY SUPPLIES) ×3 IMPLANT
GAUZE 4X4 16PLY RFD (DISPOSABLE) ×18 IMPLANT
GLOVE BIO SURGEON STRL SZ7.5 (GLOVE) ×3 IMPLANT
GLOVE BIOGEL PI IND STRL 7.0 (GLOVE) ×1 IMPLANT
GLOVE BIOGEL PI INDICATOR 7.0 (GLOVE) ×2
GLOVE SS BIOGEL STRL SZ 7 (GLOVE) ×1 IMPLANT
GLOVE SUPERSENSE BIOGEL SZ 7 (GLOVE) ×2
GOWN STRL REUS W/ TWL LRG LVL3 (GOWN DISPOSABLE) ×2 IMPLANT
GOWN STRL REUS W/TWL LRG LVL3 (GOWN DISPOSABLE) ×4
KIT BASIN OR (CUSTOM PROCEDURE TRAY) ×3 IMPLANT
KIT TURNOVER KIT B (KITS) ×3 IMPLANT
LOCATOR NERVE 3 VOLT (DISPOSABLE) ×3 IMPLANT
NEEDLE HYPO 25GX1X1/2 BEV (NEEDLE) ×3 IMPLANT
NS IRRIG 1000ML POUR BTL (IV SOLUTION) ×3 IMPLANT
PAD ARMBOARD 7.5X6 YLW CONV (MISCELLANEOUS) ×6 IMPLANT
PENCIL BUTTON HOLSTER BLD 10FT (ELECTRODE) ×3 IMPLANT
SPECIMEN JAR MEDIUM (MISCELLANEOUS) ×3 IMPLANT
SPONGE INTESTINAL PEANUT (DISPOSABLE) ×3 IMPLANT
SPONGE LAP 18X18 X RAY DECT (DISPOSABLE) IMPLANT
SPONGE LAP 4X18 RFD (DISPOSABLE) ×3 IMPLANT
STAPLER VISISTAT 35W (STAPLE) ×6 IMPLANT
SUT ETHILON 3 0 PS 1 (SUTURE) ×6 IMPLANT
SUT SILK 2 0 SH CR/8 (SUTURE) ×3 IMPLANT
SUT SILK 3 0 REEL (SUTURE) ×6 IMPLANT
SUT SILK 4 0 REEL (SUTURE) ×3 IMPLANT
SUT VIC AB 3-0 SH 27 (SUTURE) ×10
SUT VIC AB 3-0 SH 27X BRD (SUTURE) ×5 IMPLANT
SUT VIC AB 3-0 SH 8-18 (SUTURE) ×6 IMPLANT
SUT VICRYL 3 0 (SUTURE) IMPLANT
SUT VICRYL 4-0 PS2 18IN ABS (SUTURE) ×3 IMPLANT
TOWEL OR 17X24 6PK STRL BLUE (TOWEL DISPOSABLE) ×3 IMPLANT
TRAY ENT MC OR (CUSTOM PROCEDURE TRAY) ×3 IMPLANT
TRAY FOLEY MTR SLVR 14FR STAT (SET/KITS/TRAYS/PACK) ×3 IMPLANT
TUBE FEEDING 10FR FLEXIFLO (MISCELLANEOUS) IMPLANT
UNDERPAD 30X30 (UNDERPADS AND DIAPERS) IMPLANT
WATER STERILE IRR 1000ML POUR (IV SOLUTION) ×3 IMPLANT

## 2018-01-19 NOTE — Progress Notes (Signed)
Admitted to 6n25 from PACU at this time. Denies nausea/pain at this time. Family at bedside

## 2018-01-19 NOTE — Progress Notes (Signed)
01/19/2018 6:34 PM  [name], [name] [id number]  Post-Op check    Temp:  [97.3 F (36.3 C)-98.1 F (36.7 C)] 98.1 F (36.7 C) (11/20 1625) Pulse Rate:  [68-96] 91 (11/20 1625) Resp:  [9-19] 18 (11/20 1625) BP: (140-160)/(68-93) 158/78 (11/20 1625) SpO2:  [90 %-99 %] 97 % (11/20 1625) Weight:  [107 kg] 107 kg (11/20 0841),     Intake/Output Summary (Last 24 hours) at 01/19/2018 1834 Last data filed at 01/19/2018 1754 Gross per 24 hour  Intake 1750 ml  Output 1300 ml  Net 450 ml    Results for orders placed or performed during the hospital encounter of 01/19/18 (from the past 24 hour(s))  I-STAT 4, (NA,K, GLUC, HGB,HCT)     Status: Abnormal   Collection Time: 01/19/18  8:47 AM  Result Value Ref Range   Sodium 140 135 - 145 mmol/L   Potassium 3.5 3.5 - 5.1 mmol/L   Glucose, Bld 119 (H) 70 - 99 mg/dL   HCT 41.0 39.0 - 52.0 %   Hemoglobin 13.9 13.0 - 17.0 g/dL    SUBJECTIVE:  Mod pain.  No SOB.  Drinking a small amount.  Able to void.    OBJECTIVE:  Awake, alert. Voice strong and clear.  CN XI, XII seem intact.  Wound and flaps flat. Drains functional  IMPRESSION:  Satisfactory check  PLAN:   Suction drainage.  Advance diet and activity.  Analgesia.  [name] [facility]

## 2018-01-19 NOTE — Op Note (Signed)
[name], REISIG MEDICAL RECORD [id number] ACCOUNT [id number] DATE OF BIRTH:Aug 11, 1932 FACILITY: [facility] LOCATION: [facility] PHYSICIAN:Deric Bocock Guido Sander, MD  OPERATIVE REPORT  DATE OF PROCEDURE:  01/19/2018  PREOPERATIVE DIAGNOSIS:  Right neck skin cancer.  POSTOPERATIVE DIAGNOSIS:  Right neck skin cancer.  PROCEDURE: 1.  Wide local excision of right neck skin cancer, 5 cm. 2.  Right modified radical neck dissection. 3.  Cervical pectoral flap.  SURGEON:  Melida Quitter, MD.  ASSISTANT:  Jolene Provost, PA  ANESTHESIA:  General endotracheal anesthesia.  COMPLICATIONS:  None.  INDICATIONS:  The patient is an 84 year old male who has had an ulcerative mass on the right neck for several months that was biopsied by his dermatologist demonstrating carcinoma.  The lesion was fairly sizeable so he was referred for surgical  management.  CT imaging demonstrates a few enlarged lymph nodes in the neck on the right side and so he presents to the operating room for surgical management.  FINDINGS:  Large ulcerated mass was on the right lateral neck with some firmness extending from the skin lesion.  After making the incision, margins were sent from the surrounding skin that were all found to be negative on frozen section evaluation.  In  dissecting the neck, there were abnormal lymph nodes superior and deep to the skin lesion that were removed with the skin lesion.  There was another large lymph node found in the upper zone 2 region.  There were a couple of small lymph nodes also seen  during dissection of zone 3 and zone 5.  In removing the skin lesion with an adequate deep margin, the marked to the spinal accessory nerve was largely sacrificed with a branch from the cervical plexus remaining.  Some of the sternocleidomastoid muscle  also had to be removed with the skin lesion.  There was an abnormal node identified near the junction of the spinal accessory nerve and jugular vein.  DESCRIPTION  OF PROCEDURE:  The patient was identified in the holding room, informed consent having been obtained with discussion of risks, benefits, and alternatives, the patient was brought to the operative suite and put the operative table in supine  position.  Anesthesia was induced and the patient was intubated by the anesthesia team without difficulty.  The patient was given intravenous antibiotics during the case.  The eyes were taped closed and a shoulder roll was placed.  A Foley catheter was  placed for the surgery and removed at the end of the surgery.  The right neck, right upper and right chest were prepped and draped in sterile fashion.  The incision around the skin lesion was marked with a 1-2 cm margin.  The incision for the neck  dissection was also marked coming superiorly and then also inferiorly and inferomedially from the lesion incision.  This was all injected with local anesthetic.  Incision was made with a 15 blade scalpel through the skin around the skin lesion and also  making the neck dissection incision.  Subcutaneous tissues and platysmal layer were divided in the neck dissection region and dissection was continued around the skin lesion into the subcutaneous tissue as well.  Margins were then sent from around the  lesion from the remaining skin which were sent for frozen section evaluation.  These all returned negative for carcinoma.  Attention was mainly directed at this point around the skin lesion where dissection was continued through the subcutaneous layers  down to the sternocleidomastoid muscle anteriorly and superiorly.  A portion of  the sternocleidomastoid muscle had to be then removed with the skin lesion for adequate margin and this was done with Bovie electrocautery.  Superiorly, and dissecting around  the lesion, a couple of enlarged lymph nodes were encountered and were dissected free with the lesion.  Dissecting continued along the depth of the lesion with a soft tissue margin  from all directions up toward the middle where the spinal accessory  nerve was encountered.  A couple of the branches of the nerve including the main trunk were found to go into the depth of the resection region and so had to be sacrificed  #1 branch remained that stimulated and seemed to arise from the cervical plexus  primarily.  The lesion was then further dissected and fully removed and passed to nursing for pathology with an orienting suture placed at the 12 o'clock position.  At this point, the sternocleidomastoid muscle was skeletonized and subplatysmal flaps  were elevated superiorly in the neck.  Superiorly, the dissection was continued until the digastric muscle was encountered and was then dissected posteriorly and then anteriorly creating the superior margin of the dissection.  The zone 2B was then  dissected free from the sternocleidomastoid muscle as well as the trapezius muscle deeply and the spinal accessory nerve was identified in this location and dissected free.  Inferiorly, the omohyoid muscle was encountered and skeletonized.  This was  performed the inferior extent of the dissection.  Soft tissues were then elevated from medial to lateral from the region of the larynx dividing vessels and bring the soft tissue back to the internal jugular vein.  The jugular vein was then skeletonized  elevating soft tissues fully from the vein in a posterior direction.  This continued around the extent of the jugular vein until the deep cervical fascia was encountered where the dissection was taken then along the deep cervical fascia posteriorly  forming the deep margin of the dissection.  These tissues were then tucked under the sternocleidomastoid muscle and pulled back into the zone 5 region where tissues were then further dissected over the cervical plexus nerve roots and down to the  transverse cervical artery.  The soft tissues were then fully dissected free to the remaining portion of the spinal  accessory nerve, which was kept intact and the contents were then fully removed.  These were oriented with an orienting suture in zone 2/   At this point, the scan was reviewed again and a portion of the zone 2, that was inferior to the parotid gland and lateral to the sternocleidomastoid muscle was then dissected out using Bovie electrocautery and an abnormal node was found in this region  and sent as a separate specimen.    At this point, the flap was laid out with exposing the chest and marking a wide curving incision from the posterior extent of the neck wound defect curving down to the deltopectoral groove and then down to above the  nipple where the incision curved then sharply toward the midline.  This incision was injected with local anesthetic.  The incision was made with a 15 blade scalpel and then a continuous through the subcutaneous tissue using Bovie electrocautery.  The  flap was elevated in a subcutaneous plane  eventually to near the midline.  This allowed the flap to be then rotated superiorly.  The flap was trimmed at its most posterior superior extent where there was a long thin arm that was not needed.  The flap  was also trimmed,  the medial superior extent where the neck dissection incision had been made, taking out the triangle of tissue for better closure.  At this point, 10-French flat JP drains were then laid one under the pectoral portion of the flap and  then a second one laid into the neck.  These were both secured at the skin level using 2-0 nylon suture in a standard drain stitch.  The flap was then laid into place and closed starting at the superior posterior extent using 3-0 Vicryl suture in a  simple running fashion, breaking up the closure in a few places.  This was done all the way down the incision to the pectoral region closing this fully.   The neck dissection incision was closed in the same fashion.  The skin was then closed all  throughout using staples.  The drains  were kept to suction during closure.  At this point, the patient's drapes were removed and he was cleaned off.  Bacitracin ointment was added.  The drains were taped to the right shoulder.    He was then returned to Anesthesia for wakeup was extubated and moved to recovery room in stable condition.  AN/NUANCE  D:01/19/2018 T:01/19/2018 JOB:[id number]

## 2018-01-19 NOTE — Anesthesia Procedure Notes (Signed)
Procedure Name: Intubation Date/Time: 01/19/2018 10:08 AM Performed by: Raenette Rover, CRNA Pre-anesthesia Checklist: Patient identified, Emergency Drugs available, Suction available and Patient being monitored Patient Re-evaluated:Patient Re-evaluated prior to induction Oxygen Delivery Method: Circle system utilized Preoxygenation: Pre-oxygenation with 100% oxygen Induction Type: IV induction Ventilation: Mask ventilation without difficulty Laryngoscope Size: Mac and 4 Grade View: Grade I Tube type: Oral Tube size: 8.0 mm Number of attempts: 1 Airway Equipment and Method: Stylet Placement Confirmation: ETT inserted through vocal cords under direct vision,  positive ETCO2,  CO2 detector and breath sounds checked- equal and bilateral Secured at: 23 cm Tube secured with: Tape Dental Injury: Teeth and Oropharynx as per pre-operative assessment

## 2018-01-19 NOTE — Anesthesia Postprocedure Evaluation (Signed)
Anesthesia Post Note  Patient: [name]  Procedure(s) Performed: Right modified radical neck dissection; Cervicopectoral flap (Right Neck) Wide local excision of right neck skin cancer (5 cm) (Right Neck)     Patient location during evaluation: PACU Anesthesia Type: General Level of consciousness: sedated Pain management: pain level controlled Vital Signs Assessment: post-procedure vital signs reviewed and stable Respiratory status: spontaneous breathing and respiratory function stable Cardiovascular status: stable Postop Assessment: no apparent nausea or vomiting Anesthetic complications: no    Last Vitals:  Vitals:   01/19/18 1558 01/19/18 1625  BP:  (!) 158/78  Pulse:  91  Resp:  18  Temp: (!) 36.4 C 36.7 C  SpO2:  97%    Last Pain:  Vitals:   01/19/18 1625  TempSrc: Oral  PainSc:                  Joelle Flessner DANIEL

## 2018-01-19 NOTE — H&P (Signed)
[name] is an 84 y.o. male.   Chief Complaint: Neck skin cancer HPI: 84 year old male with a long history of right neck skin pain with development of an ulcerative mass over several months.  A biopsy by his dermatologist demonstrated cancer.  He presents for surgical management.  Past Medical History:  Diagnosis Date  . Back pain   . DVT (deep venous thrombosis) ([place])   . Glaucoma   . Hypertension   . Insomnia   . Pulmonary embolism, bilateral [facility]) May 2012   on coumadin  . Skin cancer    forehead  . Skin cancer   . Vertigo     Past Surgical History:  Procedure Laterality Date  . BACK SURGERY     x3  . JOINT REPLACEMENT    . SHOULDER SURGERY     x3  . TOTAL HIP ARTHROPLASTY     right  . TOTAL HIP ARTHROPLASTY  2012   Dr. Alvan Dame right hip  . TRANSTHORACIC ECHOCARDIOGRAM  07/26/2010   Left ventricle: The cavity size was normal. Wall thickness was increased in a pattern of moderate LVH. Systolic function was normal. The estimated ejection fraction was in the range of 55% to 60%    Family History  Problem Relation Age of Onset  . Stroke Father   . Alzheimer's disease Mother   . Lung cancer Brother    Social History:  reports that he quit smoking about 36 years ago. His smoking use included cigarettes. He has a 25.00 pack-year smoking history. He quit smokeless tobacco use about 9 years ago.  His smokeless tobacco use included chew. He reports that he drinks alcohol. He reports that he does not use drugs.  Allergies:  Allergies  Allergen Reactions  . Codeine Nausea And Vomiting  . Morphine And Related Nausea And Vomiting    Medications Prior to Admission  Medication Sig Dispense Refill  . Ascorbic Acid (VITAMIN C) 1000 MG tablet Take 1,000 mg by mouth daily.      Marland Kitchen aspirin 81 MG tablet Take 81 mg by mouth daily.     . cholecalciferol (VITAMIN D3) 25 MCG (1000 UT) tablet Take 1,000 Units by mouth daily.     . diphenhydrAMINE (BENADRYL) 50 MG capsule Take 50 mg by  mouth daily.    Marland Kitchen GLUCOSAMINE-CHONDROITIN PO Take 1 tablet by mouth daily.    Marland Kitchen L-Arginine 1000 MG TABS Take 1,000 mg by mouth daily.    Marland Kitchen latanoprost (XALATAN) 0.005 % ophthalmic solution Place 1 drop into both eyes at bedtime.     Marland Kitchen losartan (COZAAR) 100 MG tablet Take 1 tablet (100 mg total) by mouth daily. NEED OV. 30 tablet 0  . meclizine (ANTIVERT) 25 MG tablet Take 25 mg by mouth every 6 (six) hours as needed for dizziness.     . mirtazapine (REMERON) 30 MG tablet Take 30 mg by mouth at bedtime.    . Red Yeast Rice 600 MG CAPS Take 1,200 mg by mouth daily.     . Saw Palmetto, Serenoa repens, (SAW PALMETTO PO) Take 1,800 mg by mouth daily.    . vitamin E 400 UNIT capsule Take 400 Units by mouth daily.       Results for orders placed or performed during the hospital encounter of 01/19/18 (from the past 48 hour(s))  I-STAT 4, (NA,K, GLUC, HGB,HCT)     Status: Abnormal   Collection Time: 01/19/18  8:47 AM  Result Value Ref Range   Sodium  140 135 - 145 mmol/L   Potassium 3.5 3.5 - 5.1 mmol/L   Glucose, Bld 119 (H) 70 - 99 mg/dL   HCT 41.0 39.0 - 52.0 %   Hemoglobin 13.9 13.0 - 17.0 g/dL   No results found.  Review of Systems  Musculoskeletal: Positive for neck pain.  All other systems reviewed and are negative.   Blood pressure (!) 160/83, pulse 68, temperature (!) 97.5 F (36.4 C), temperature source Oral, resp. rate 18, height 5\' 10"  (1.778 m), weight 107 kg, SpO2 96 %. Physical Exam  Constitutional: He is oriented to person, place, and time. He appears well-developed and well-nourished. No distress.  HENT:  Head: Normocephalic and atraumatic.  Right Ear: External ear normal.  Left Ear: External ear normal.  Nose: Nose normal.  Mouth/Throat: Oropharynx is clear and moist.  Eyes: Pupils are equal, round, and reactive to light. Conjunctivae and EOM are normal.  Neck:  Right neck with 3 x 3 cm ulcerative skin mass with 1-2 cm of surrounding redness.  Cardiovascular: Normal  rate.  Respiratory: Effort normal.  Neurological: He is alert and oriented to person, place, and time. No cranial nerve deficit.  Skin: Skin is warm and dry.  Psychiatric: He has a normal mood and affect. His behavior is normal. Judgment and thought content normal.     Assessment/Plan Right neck skin cancer  To OR for wide excision of right neck skin cancer, right neck dissection, and cervicopectoral flap reconstruction.  Melida Quitter, MD 01/19/2018, 9:54 AM

## 2018-01-19 NOTE — Transfer of Care (Signed)
Immediate Anesthesia Transfer of Care Note  Patient: [name]  Procedure(s) Performed: Right modified radical neck dissection; Cervicopectoral flap (Right Neck) Wide local excision of right neck skin cancer (5 cm) (Right Neck)  Patient Location: PACU  Anesthesia Type:General  Level of Consciousness: drowsy and patient cooperative  Airway & Oxygen Therapy: Patient Spontanous Breathing and Patient connected to face mask oxygen  Post-op Assessment: Report given to RN and Post -op Vital signs reviewed and stable  Post vital signs: Reviewed and stable  Last Vitals:  Vitals Value Taken Time  BP 145/77 01/19/2018  2:03 PM  Temp    Pulse 95 01/19/2018  2:05 PM  Resp 21 01/19/2018  2:05 PM  SpO2 98 % 01/19/2018  2:05 PM  Vitals shown include unvalidated device data.  Last Pain:  Vitals:   01/19/18 0853  TempSrc: Oral  PainSc:          Complications: No apparent anesthesia complications

## 2018-01-19 NOTE — Brief Op Note (Signed)
01/19/2018  2:04 PM  PATIENT:  [name]  84 y.o. male  PRE-OPERATIVE DIAGNOSIS:  Skin Cancer  POST-OPERATIVE DIAGNOSIS:  skin cancer  PROCEDURE:  Procedure(s): Right modified radical neck dissection; Cervicopectoral flap (Right) Wide local excision of right neck skin cancer (5 cm) (Right)  SURGEON:  Surgeon(s) and Role:    Redmond Baseman, Orpah Greek, MD - Primary  PHYSICIAN ASSISTANT: Sallee Provencal  ASSISTANTS: none   ANESTHESIA:   general  EBL:  150 mL   BLOOD ADMINISTERED:none  DRAINS: (10 Fr) Jackson-Pratt drain(s) with closed bulb suction in the pectoral region and neck   LOCAL MEDICATIONS USED:  LIDOCAINE   SPECIMEN:  Source of Specimen:  Right neck skin cancer with margins.  Right neck contents zones 2,3, and 5.  Additional right neck zone 2 tissue.  DISPOSITION OF SPECIMEN:  PATHOLOGY  COUNTS:  YES  TOURNIQUET:  * No tourniquets in log *  DICTATION: .Other Dictation: Dictation Number [id number]  PLAN OF CARE: Admit to inpatient   PATIENT DISPOSITION:  PACU - hemodynamically stable.   Delay start of Pharmacological VTE agent (>24hrs) due to surgical blood loss or risk of bleeding: no

## 2018-01-20 ENCOUNTER — Encounter (HOSPITAL_COMMUNITY): Payer: Self-pay | Admitting: Otolaryngology

## 2018-01-20 NOTE — Progress Notes (Signed)
Checked JP drains and stripped them both. They are both charged, will continue to check them and make sure they are maintaining a charge.

## 2018-01-20 NOTE — Progress Notes (Signed)
   Subjective:    Patient ID: ENDI LAGMAN, male    DOB: 09/14/1932, 84 y.o.   MRN: [id number]  HPI Feeling good.  Surprised about how little pain he has.  Voiding well.  Got up to chair this morning.  Review of Systems     Objective:   Physical Exam Tm 100.5 VSS Alert, NAD Right neck/chest incision clean and intact, no fluid collection, flap healthy, drains in place Shoulder shrug intact on right, maybe slightly weak Normal facial movement    Assessment & Plan:  Right neck skin cancer s/p excision, neck dissection, and cervicopectoral flap  Doing well.  Will leave drains in place for now.  Ambulate with assistance.

## 2018-01-20 NOTE — Progress Notes (Signed)
Ambulated approximately 120 ft  with pt on the hall.  Pt was unsteady and required walker.  Pt  Tolerated activity well.  Upon arrival to his room, pt agreed to sit on his chair.

## 2018-01-21 MED ORDER — PHENOL 1.4 % MT LIQD
1.0000 | OROMUCOSAL | Status: DC | PRN
  Administered 2018-01-21: 1 via OROMUCOSAL
  Filled 2018-01-21: qty 177

## 2018-01-21 NOTE — NC FL2 (Signed)
[place] LEVEL OF CARE SCREENING TOOL     IDENTIFICATION  Patient Name: [name] Birthdate: [birth year] Sex: male Admission Date (Current Location): 01/19/2018  [facility] and [place] Number:  Herbalist and Address:  [facility] [place], [place] [address] [place], [place] [address]      Provider Number: [id number]  Attending Physician Name and Address:  Melida Quitter, MD  Relative Name and Phone Number:       Current Level of Care: Hospital Recommended Level of Care: [place] Prior Approval Number:    Date Approved/Denied:   PASRR Number: [id number]  Discharge Plan: SNF    Current Diagnoses: Patient Active Problem List   Diagnosis Date Noted  . Skin cancer of scalp or skin of neck 01/19/2018  . Abnormal chest x-ray 02/17/2011  . Osteoarthritis 11/18/2010  . Pulmonary embolism ([place]) 08/15/2010  . Chronic anticoagulation 08/15/2010  . Lung nodule 08/15/2010  . Cor pulmonale ([place]) 08/15/2010  . Dyspnea 07/25/2010  . Glaucoma   . Back pain   . Skin cancer   . Insomnia     Orientation RESPIRATION BLADDER Height & Weight     Self, Time, Situation, Place  O2(nasal canula 1L) Continent Weight: 235 lb 14.3 oz (107 kg) Height:  5\' 10"  (177.8 cm)  BEHAVIORAL SYMPTOMS/MOOD NEUROLOGICAL BOWEL NUTRITION STATUS      Continent Diet  AMBULATORY STATUS COMMUNICATION OF NEEDS Skin   Limited Assist Verbally Surgical wounds(incision on neck/chest with liquid skin adhesive)                       Personal Care Assistance Level of Assistance  Bathing, Feeding, Dressing Bathing Assistance: Limited assistance Feeding assistance: Independent Dressing Assistance: Limited assistance     Functional Limitations Info  Sight, Hearing, Speech Sight Info: Adequate Hearing Info: Adequate Speech Info: Adequate    SPECIAL CARE FACTORS FREQUENCY  OT (By licensed OT), PT (By licensed PT)     PT Frequency: 5x week OT  Frequency: 5x week            Contractures Contractures Info: Not present    Additional Factors Info  Code Status, Allergies, Psychotropic Code Status Info: Full Code Allergies Info: CODEINE, MORPHINE AND RELATED  Psychotropic Info: mirtazapine (REMERON) tablet 30 mg daily at bedtime         Current Medications (01/21/2018):  This is the current hospital active medication list Current Facility-Administered Medications  Medication Dose Route Frequency Provider Last Rate Last Dose  . aspirin EC tablet 81 mg  81 mg Oral Daily Melida Quitter, MD   81 mg at 01/21/18 1028  . bacitracin ointment 1 application  1 application Topical W9N Melida Quitter, MD   1 application at 98/92/11 1348  . cholecalciferol (VITAMIN D3) tablet 1,000 Units  1,000 Units Oral Daily Melida Quitter, MD   1,000 Units at 01/21/18 1028  . dextrose 5 % and 0.45 % NaCl with KCl 20 mEq/L infusion   Intravenous Continuous Melida Quitter, MD 75 mL/hr at 01/21/18 1301    . HYDROcodone-acetaminophen (NORCO/VICODIN) 5-325 MG per tablet 1-2 tablet  1-2 tablet Oral Q4H PRN Melida Quitter, MD   2 tablet at 01/21/18 1035  . latanoprost (XALATAN) 0.005 % ophthalmic solution 1 drop  1 drop Both Eyes QHS Melida Quitter, MD   1 drop at 01/20/18 2146  . losartan (COZAAR) tablet 100 mg  100 mg Oral Daily Melida Quitter, MD   100  mg at 01/21/18 1028  . meclizine (ANTIVERT) tablet 25 mg  25 mg Oral Q6H PRN Melida Quitter, MD      . mirtazapine (REMERON) tablet 30 mg  30 mg Oral QHS Melida Quitter, MD   30 mg at 01/20/18 2147  . ondansetron (ZOFRAN) tablet 4 mg  4 mg Oral Q4H PRN Melida Quitter, MD       Or  . ondansetron [facility]) injection 4 mg  4 mg Intravenous Q4H PRN Melida Quitter, MD      . phenol [facility]) mouth spray 1 spray  1 spray Mouth/Throat PRN Melida Quitter, MD   1 spray at 01/21/18 1307     Discharge Medications: Please see discharge summary for a list of discharge medications.  Relevant Imaging Results:  Relevant Lab  Results:   Additional Information [id number] [place], [place]

## 2018-01-21 NOTE — Evaluation (Addendum)
Physical Therapy Evaluation Patient Details Name: [name] MRN: [id number] DOB: [birth year] Today's Date: 01/21/2018   History of Present Illness   Pt admit for Right neck skin cancer s/p wide local excision, modified radical neck dissection, and cervicopectoral flap.  Pt with long history of vertigo. PMH:  Back pain and HTN.  Clinical Impression  Pt admitted with above diagnosis. Pt currently with functional limitations due to the deficits listed below (see PT Problem List). Pt with positive right hypofunction.  Initiated x1 exercises with pt.  Pt to perform at least 5x day.  Pt dizzy with exercises but could perform up to 20 seconds with practice.  Pt mod to min assist without device to ambulate due to poor coordination and need for A device for safety.  Discussed compensatory strategies with pt. Feel that pt needs SNF for therapy prior to d/c home.  Pt agrees.    Pt will benefit from skilled PT to increase their independence and safety with mobility to allow discharge to the venue listed below.      Follow Up Recommendations SNF;Supervision/Assistance - 24 hour    Equipment Recommendations  None recommended by PT    Recommendations for Other Services       Precautions / Restrictions Precautions Precautions: Fall Restrictions Weight Bearing Restrictions: No      Mobility  Bed Mobility Overal bed mobility: Independent                Transfers Overall transfer level: Independent                  Ambulation/Gait Ambulation/Gait assistance: Min assist;Mod assist Gait Distance (Feet): 45 Feet Assistive device: Rolling walker (2 wheeled);None Gait Pattern/deviations: Step-to pattern;Decreased step length - right;Decreased step length - left;Shuffle;Drifts right/left;Trunk flexed   Gait velocity interpretation: <1.8 ft/sec, indicate of risk for recurrent falls General Gait Details: Pt with anterior lean with festinating gait as well.  Pt with poor coordination  overall.  With RW, needed min guard assist but did not challenge.  Min to mod assist without RW.   Stairs            Wheelchair Mobility    Modified Rankin (Stroke Patients Only)       Balance Overall balance assessment: Needs assistance Sitting-balance support: No upper extremity supported;Feet supported Sitting balance-Leahy Scale: Fair     Standing balance support: Bilateral upper extremity supported;During functional activity;No upper extremity supported Standing balance-Leahy Scale: Poor Standing balance comment: relies on UE support for balance.                              Pertinent Vitals/Pain Pain Assessment: No/denies pain    Home Living Family/patient expects to be discharged to:: Private residence Living Arrangements: Alone Available Help at Discharge: Family;Available 24 hours/day(sister lives in area and he can stay with her) Type of Home: House Home Access: Stairs to enter Entrance Stairs-Rails: None Entrance Stairs-Number of Steps: 1 Home Layout: One level Home Equipment: Environmental consultant - 2 wheels;Cane - single point Additional Comments: Pt sisster's house is a level entry    Prior Function Level of Independence: Independent               Hand Dominance        Extremity/Trunk Assessment   Upper Extremity Assessment Upper Extremity Assessment: Defer to OT evaluation    Lower Extremity Assessment Lower Extremity Assessment: Generalized weakness    Cervical / Trunk  Assessment Cervical / Trunk Assessment: Normal  Communication   Communication: No difficulties  Cognition Arousal/Alertness: Awake/alert Behavior During Therapy: WFL for tasks assessed/performed Overall Cognitive Status: Within Functional Limits for tasks assessed                                        General Comments     01/21/18 0001  Symptom Behavior  Type of Dizziness "World moves"  Frequency of Dizziness constant  Duration of Dizziness  hours  Aggravating Factors Walking in a crowd;Sit to stand;Turning body quickly;Turning head quickly;Turning head sideways  Relieving Factors Lying supine;Closing eyes;Rest;Slow movements;Avoiding busy/distracting areas  Occulomotor Exam  Occulomotor Alignment Normal  Spontaneous Absent  Gaze-induced Left beating nystagmus with L gaze  Smooth Pursuits Intact  Saccades Poor trajectory;Slow  Vestibulo-Occular Reflex  VOR 1 Head Only (x 1 viewing) Positive right  VOR to Slow Head Movement Positive right  Cognition  Cognition Orientation Level Oriented x 4  Positional Sensitivities  Head Turning x 5 3  Head Nodding x 5 3  Pivot Right in Standing 3  Pivot Left in Standing 3   O2 94% on RA   Exercises     Assessment/Plan    PT Assessment Patient needs continued PT services  PT Problem List Decreased balance;Decreased activity tolerance;Decreased mobility;Decreased knowledge of use of DME;Decreased safety awareness;Decreased knowledge of precautions;Cardiopulmonary status limiting activity(Gaze stability)       PT Treatment Interventions DME instruction;Gait training;Functional mobility training;Stair training;Therapeutic activities;Therapeutic exercise;Balance training;Patient/family education(gaze stability exercises)    PT Goals (Current goals can be found in the Care Plan section)  Acute Rehab PT Goals Patient Stated Goal: to go home PT Goal Formulation: With patient Time For Goal Achievement: 02/04/18 Potential to Achieve Goals: Good    Frequency Min 3X/week   Barriers to discharge Decreased caregiver support      Co-evaluation               AM-PAC PT "6 Clicks" Daily Activity  Outcome Measure Difficulty turning over in bed (including adjusting bedclothes, sheets and blankets)?: None Difficulty moving from lying on back to sitting on the side of the bed? : None Difficulty sitting down on and standing up from a chair with arms (e.g., wheelchair, bedside commode,  etc,.)?: A Lot Help needed moving to and from a bed to chair (including a wheelchair)?: A Lot Help needed walking in hospital room?: A Lot Help needed climbing 3-5 steps with a railing? : A Lot 6 Click Score: 16    End of Session Equipment Utilized During Treatment: Gait belt Activity Tolerance: Patient limited by fatigue Patient left: with call bell/phone within reach;in bed Nurse Communication: Mobility status;Need for lift equipment PT Visit Diagnosis: Muscle weakness (generalized) (M62.81);Dizziness and giddiness (R42);Unsteadiness on feet (R26.81)    Time: 0045-9977 PT Time Calculation (min) (ACUTE ONLY): 46 min   Charges:   PT Evaluation $PT Eval Moderate Complexity: 1 Mod PT Treatments $Therapeutic Activity: 8-22 mins $Self Care/Home Management: 8-22        [place] Pager:  [phone]  Office:  [phone]    Denice Paradise 01/21/2018, 3:22 PM

## 2018-01-21 NOTE — Progress Notes (Addendum)
   Subjective:    Patient ID: [name], male    DOB: [birth year], 84 y.o.   MRN: [id number]  HPI Doing fairly well.  Complains of soreness in his throat that he feels more when he swallows.  He also has some vertigo that is worse when he tries to get up.  Review of Systems     Objective:   Physical Exam AF VSS Alert, NAD Normal voice. Right neck/chest incision clean and intact, flap looks healthy, no fluid collection. Drains 45 from neck, 26 from chest in past 24 hours.  Removed chest drain. CNs VII and XI symmetrically mobile.    Assessment & Plan:  Right neck skin cancer s/p wide local excision, modified radical neck dissection, and cervicopectoral flap.  Removed chest drain.  Possible removal of neck drain tomorrow.  Will add chloraseptic prn.  He has meclizine available (home medication).  Physical therapy to consult.  Discharge when stable from surgical and ADL standpoint.

## 2018-01-22 MED ORDER — ENOXAPARIN SODIUM 40 MG/0.4ML ~~LOC~~ SOLN
40.0000 mg | SUBCUTANEOUS | Status: DC
  Administered 2018-01-22 – 2018-01-24 (×3): 40 mg via SUBCUTANEOUS
  Filled 2018-01-22 (×3): qty 0.4

## 2018-01-22 NOTE — Progress Notes (Signed)
Physical Therapy Treatment Patient Details Name: [name] MRN: [id number] DOB: 18-Jul-1932 Today's Date: 01/22/2018    History of Present Illness  Pt admit for Right neck skin cancer s/p wide local excision, modified radical neck dissection, and cervicopectoral flap.  Pt with long history of vertigo. PMH:  Back pain and HTN.    PT Comments    Pt admitted with above diagnosis. Pt currently with functional limitations due to dizziness, balance and endurance deficits. Pt coughing a lot more today than yesterday and throat feeling sore and irritated per pt.   Asked nurse if pt could have anything for coughing and nurse to check with MD.  Also pt would like Antivert for dizziness and nurse to bring that. Pt encouraged to keep working on exercises for vertigo and to ask for Zofran for nausea and Antivert for dizziness prn.  Pt agrees. Pt will benefit from skilled PT to increase their independence and safety with mobility to allow discharge to the venue listed below.     Follow Up Recommendations  SNF;Supervision/Assistance - 24 hour     Equipment Recommendations  None recommended by PT    Recommendations for Other Services       Precautions / Restrictions Precautions Precautions: Fall Restrictions Weight Bearing Restrictions: No    Mobility  Bed Mobility                  Transfers                    Ambulation/Gait                 Stairs             Wheelchair Mobility    Modified Rankin (Stroke Patients Only)       Balance                                            Cognition Arousal/Alertness: Awake/alert Behavior During Therapy: WFL for tasks assessed/performed Overall Cognitive Status: Within Functional Limits for tasks assessed                                        Exercises Other Exercises Other Exercises: x 1 exercises x 3 reps up to 30 seconds each time with dizziness 7/10 with  nausea.   Asked nurse  Antivert for pt as well as cough med as pt coughing alot.      General Comments        Pertinent Vitals/Pain Pain Assessment: No/denies pain    Home Living                      Prior Function            PT Goals (current goals can now be found in the care plan section) Acute Rehab PT Goals Patient Stated Goal: to go home Progress towards PT goals: Not progressing toward goals - comment(dizziness/nausea and cough limited today's treatment)    Frequency    Min 3X/week      PT Plan Current plan remains appropriate    Co-evaluation              AM-PAC PT "6 Clicks" Mobility   Outcome Measure  Help needed turning from your  back to your side while in a flat bed without using bedrails?: None Help needed moving from lying on your back to sitting on the side of a flat bed without using bedrails?: None Help needed moving to and from a bed to a chair (including a wheelchair)?: A Lot Help needed standing up from a chair using your arms (e.g., wheelchair or bedside chair)?: A Lot Help needed to walk in hospital room?: A Lot Help needed climbing 3-5 steps with a railing? : Total 6 Click Score: 15    End of Session   Activity Tolerance: (limited by dizziness and nausea as well as coughing alot) Patient left: with call bell/phone within reach;in bed Nurse Communication: Mobility status;Need for lift equipment PT Visit Diagnosis: Muscle weakness (generalized) (M62.81);Dizziness and giddiness (R42);Unsteadiness on feet (R26.81)     Time: 6389-3734 PT Time Calculation (min) (ACUTE ONLY): 13 min  Charges:  $Therapeutic Exercise: 8-22 mins                     [place] Pager:  [phone]  Office:  [place] 01/22/2018, 3:19 PM

## 2018-01-22 NOTE — Progress Notes (Signed)
   Subjective:    Patient ID: [name], male    DOB: 1932/07/19, 84 y.o.   MRN: [id number]  HPI Doing fairly well.  Has had some coughing this morning, improving. Minimal use of incentive spirometer.  He denies shortness of breath.  He is tolerating more p.o. intake.  IV fluids remain on.  Had physical therapy evaluation yesterday.  Not currently complaining of any dizziness.  Pain is controlled.     Objective:   Physical Exam AF VSS Alert, wake oriented, appropriate Normal voice. Right neck/chest incision clean and intact, staples in place, flap looks healthy, no fluid collection.  No ecchymosis Drains 70 from neck CNs VII and XI symmetrically mobile.    Data Review:   PT eval- "Discussed compensatory strategies with pt. Feel that pt needs SNF for therapy prior to d/c home.  Pt agrees.    Pt will benefit from skilled PT to increase their independence and safety with mobility to allow discharge to the venue listed below"  Assessment & Plan:  Right neck skin cancer s/p wide local excision, modified radical neck dissection, and cervicopectoral flap.  The right neck drain will remain as its output was 70 cc.  We will continue to monitor.  Improving taking p.o. intake.  Will saline lock once he is drinking a little bit more.  Encourage use of incentive spirometer. I have added in VTE prophylaxis to include Lovenox.  He has meclizine available (home medication).   Physical therapy to follow.  Discharge when stable from surgical and ADL standpoint.  Patient will need SNF placement.  Social work is following.  Helayne Seminole, MD

## 2018-01-23 MED ORDER — BISACODYL 5 MG PO TBEC
5.0000 mg | DELAYED_RELEASE_TABLET | Freq: Every day | ORAL | Status: DC | PRN
  Administered 2018-01-23 – 2018-01-24 (×2): 5 mg via ORAL
  Filled 2018-01-23 (×2): qty 1

## 2018-01-23 NOTE — Progress Notes (Signed)
   Subjective:    Patient ID: [name], male    DOB: [birth year], 84 y.o.   MRN: [id number]  HPI Doing fairly well.  Using incentive spirometer.   He is tolerating more p.o. intake.  Saline locked IVF.  Worked with physical therapy.  Not currently complaining of any dizziness.  Pain is controlled.     Objective:   Physical Exam AF VSS Alert, wake oriented, appropriate Normal voice. Right neck/chest incision clean and intact, staples in place, flap looks healthy, no fluid collection.  No ecchymosis Drain: 50 from neck CNs VII and XI symmetrically mobile.      Assessment & Plan:  Right neck skin cancer s/p wide local excision, modified radical neck dissection, and cervicopectoral flap.  The right neck drain will remain as its output was 50 cc- decreasing, improving.  We will continue to monitor.   Encourage use of incentive spirometer. Continue SCDs, Lovenox. He has meclizine available (home medication).   Physical therapy will continue to follow.  Discharge when stable from surgical and ADL standpoint.  Patient will likely need SNF placement.  Social work is following. Patient may shower with assistance (keep drain site dry). Apply bacitracin ointment to incision line after shower.   Helayne Seminole, MD

## 2018-01-24 NOTE — Progress Notes (Signed)
Physical Therapy Treatment Patient Details Name: [name] MRN: [id number] DOB: [birth year] Today's Date: 01/24/2018    History of Present Illness  Pt admit for Right neck skin cancer s/p wide local excision, modified radical neck dissection, and cervicopectoral flap.  Pt with long history of vertigo. PMH:  Back pain and HTN.    PT Comments    Pt admitted with above diagnosis. Pt currently with functional limitations due to balance and endurance deficits. Pt was able to ambulate with RW with min guard assist. Reviewed x1 exercises and sent progression to pt via email.  Pt states he is going home with sister.  Declines f/u even though encouraged pt to get HHPT.   Pt will benefit from skilled PT to increase their independence and safety with mobility to allow discharge to the venue listed below.     Follow Up Recommendations  Home health PT;Supervision/Assistance - 24 hour     Equipment Recommendations  None recommended by PT    Recommendations for Other Services       Precautions / Restrictions Precautions Precautions: Fall Restrictions Weight Bearing Restrictions: No    Mobility  Bed Mobility Overal bed mobility: Independent                Transfers Overall transfer level: Independent                  Ambulation/Gait Ambulation/Gait assistance: Min guard Gait Distance (Feet): 50 Feet Assistive device: Rolling walker (2 wheeled);None Gait Pattern/deviations: Step-to pattern;Decreased step length - right;Decreased step length - left;Drifts right/left;Trunk flexed   Gait velocity interpretation: <1.8 ft/sec, indicate of risk for recurrent falls General Gait Details: Pt fairly steady with RW.  Pt aware to use RW.  Pt also aware that PT recommends further therapy at SNF or [facility] but pt declines both.     Stairs             Wheelchair Mobility    Modified Rankin (Stroke Patients Only)       Balance Overall balance assessment: Needs  assistance Sitting-balance support: No upper extremity supported;Feet supported Sitting balance-Leahy Scale: Fair     Standing balance support: Bilateral upper extremity supported;During functional activity;No upper extremity supported Standing balance-Leahy Scale: Poor Standing balance comment: relies on UE support for balance.                             Cognition Arousal/Alertness: Awake/alert Behavior During Therapy: WFL for tasks assessed/performed Overall Cognitive Status: Within Functional Limits for tasks assessed                                        Exercises Other Exercises Other Exercises: x 1 exercises x 3 reps up to 30 seconds each time with dizziness 7/10.      General Comments General comments (skin integrity, edema, etc.): Access Code: [email or web address] exercise program sent to [email or web address]  Reviewed current exercise as well as that the program this PT sent gives pt the progression for home exercise.        Pertinent Vitals/Pain Pain Assessment: No/denies pain    Home Living                      Prior Function            PT Goals (current goals can now be  found in the care plan section) Acute Rehab PT Goals Patient Stated Goal: to go home Progress towards PT goals: Progressing toward goals    Frequency    Min 3X/week      PT Plan Discharge plan needs to be updated    Co-evaluation              AM-PAC PT "6 Clicks" Mobility   Outcome Measure  Help needed turning from your back to your side while in a flat bed without using bedrails?: None Help needed moving from lying on your back to sitting on the side of a flat bed without using bedrails?: None Help needed moving to and from a bed to a chair (including a wheelchair)?: A Little Help needed standing up from a chair using your arms (e.g., wheelchair or bedside chair)?: A Little Help needed to walk in hospital room?: A Little Help needed climbing 3-5  steps with a railing? : Total 6 Click Score: 18    End of Session Equipment Utilized During Treatment: Gait belt Activity Tolerance: Patient tolerated treatment well Patient left: with call bell/phone within reach;in chair Nurse Communication: Mobility status PT Visit Diagnosis: Muscle weakness (generalized) (M62.81);Dizziness and giddiness (R42);Unsteadiness on feet (R26.81)     Time: 0375-4360 PT Time Calculation (min) (ACUTE ONLY): 18 min  Charges:  $Therapeutic Exercise: 8-22 mins                     [place] Pager:  [phone]  Office:  [place] 01/24/2018, 5:00 PM

## 2018-01-24 NOTE — Care Management Important Message (Signed)
Important Message  Patient Details  Name: [name] MRN: [id number] Date of Birth: [birth year]   Medicare Important Message Given:  Yes    [name] 01/24/2018, 4:48 PM

## 2018-01-24 NOTE — Progress Notes (Signed)
Pt for discharge going home his sister aware, discontinued peripheral IV line, given all his personal belongings, given health teachings, next appointment, prescriptions, due med explained and understood, no s/s of pain at this time, wound site dry and intact, no bleeding noted, he had a shower, tolerates food, no s/s of nausea and vomiting.

## 2018-01-24 NOTE — Discharge Summary (Signed)
Physician Discharge Summary  Patient ID: DETRELL UMSCHEID MRN: [id number] DOB/AGE: 82/11/1932 82 y.o.  Admit date: 01/19/2018 Discharge date: 01/24/2018  Admission Diagnoses:  Right neck skin cancer  Discharge Diagnoses:  Active Problems:   Skin cancer of scalp or skin of neck   Discharged Condition: good  Hospital Course: 84 year old male presented to hospital with sizeable right neck skin cancer for surgical management.  See operative note.  He was admitted after surgery to a regular room with two drains in place.  He progressed slowly with some problems with vertigo (pre-existing condition) and throat soreness but was able to advance his diet.  Physical therapy evaluated the patient and worked with him.  One of his drains was removed on POD 2.  He continued to progress slowly with physical therapy recommended a skilled nursing facility upon discharge.  He has refused this option and wishes to be discharged home.  His second drain was removed today and he is felt stable for discharge.  He has a sister who will help him at home.  Consults: Physical therapy  Significant Diagnostic Studies: None  Treatments: surgery: Wide excision right neck skin cancer, right modified radical neck dissection, and cervicopectoral flap.  Discharge Exam: Blood pressure 124/88, pulse 79, temperature 98.1 F (36.7 C), temperature source Oral, resp. rate 17, height 5\' 10"  (1.778 m), weight 107 kg, SpO2 93 %. General appearance: alert, cooperative and no distress Neck: drain removed from neck, right neck incision clean and intact with no fluid collection Chest wall: incision on right chest clean and intact with no fluid collection  Disposition: Discharge disposition: 01-Home or Self Care       Discharge Instructions    Diet - low sodium heart healthy   Complete by:  As directed    Discharge instructions   Complete by:  As directed    Avoid strenuous activity.  OK to allow incision to get wet, gently  pat dry.  Apply antibiotic ointment like Bacitracin or Neosporin to incision twice daily.   Increase activity slowly   Complete by:  As directed      Allergies as of 01/24/2018      Reactions   Codeine Nausea And Vomiting   Morphine And Related Nausea And Vomiting      Medication List    TAKE these medications   aspirin 81 MG tablet Take 81 mg by mouth daily.   cholecalciferol 25 MCG (1000 UT) tablet Commonly known as:  VITAMIN D3 Take 1,000 Units by mouth daily.   diphenhydrAMINE 50 MG capsule Commonly known as:  BENADRYL Take 50 mg by mouth daily.   GLUCOSAMINE-CHONDROITIN PO Take 1 tablet by mouth daily.   L-Arginine 1000 MG Tabs Take 1,000 mg by mouth daily.   latanoprost 0.005 % ophthalmic solution Commonly known as:  XALATAN Place 1 drop into both eyes at bedtime.   losartan 100 MG tablet Commonly known as:  COZAAR Take 1 tablet (100 mg total) by mouth daily. NEED OV.   meclizine 25 MG tablet Commonly known as:  ANTIVERT Take 25 mg by mouth every 6 (six) hours as needed for dizziness.   mirtazapine 30 MG tablet Commonly known as:  REMERON Take 30 mg by mouth at bedtime.   Red Yeast Rice 600 MG Caps Take 1,200 mg by mouth daily.   SAW PALMETTO PO Take 1,800 mg by mouth daily.   vitamin C 1000 MG tablet Take 1,000 mg by mouth daily.   vitamin E 400  UNIT capsule Take 400 Units by mouth daily.      Follow-up Information    Melida Quitter, MD. Schedule an appointment as soon as possible for a visit on 01/31/2018.   Specialty:  Otolaryngology Contact information: [address] [place] [address] [phone]           Signed: Melida Quitter 01/24/2018, 12:47 PM

## 2018-01-24 NOTE — Care Management Note (Signed)
Case Management Note  Patient Details  Name: [name] MRN: [id number] Date of Birth: 20-Dec-1932  Subjective/Objective:                    Action/Plan:  Discussed discharge planning with patient at bedside. Explained PT recommendation of SNF. Patient is declining SNF.  Also, explained PT recommending 24 hour supervision. Confirmed patient is home alone. However, he states he is going to stay with his siister who lives in [place] at discharge. Explained HHPT can assist with vertigo at home. However, patient states he does not want home health.   Called DR Redmond Baseman office , Lattie Haw aware. Expected Discharge Date:                  Expected Discharge Plan:  Home/Self Care  In-House Referral:     Discharge planning Services  CM Consult  Post Acute Care Choice:  NA Choice offered to:  Patient  DME Arranged:  N/A DME Agency:  NA  HH Arranged:  Refused SNF, Patient Refused [facility] Agency:  NA  Status of Service:  In process, will continue to follow  If discussed at Long Length of Stay Meetings, dates discussed:    Additional Comments:  Marilu Favre, RN 01/24/2018, 11:39 AM

## 2018-01-24 NOTE — Progress Notes (Signed)
Pt for discharge going home, PT recommend him to stay in SNF but he refused, second option is home health still pt refused, third option I spoke to his sister Tamela Oddi , she said is is willing to take care of him but if he throw up nobody will clean it, she said the pt throw up this am, I said no , pt feel like throwing up but I gave zofran, and no nausea /vomiting noted after giving the med. According to Dr. Redmond Baseman its okay to observe the pt until dinner time to monitor the nausea/vomiting.

## 2018-02-08 ENCOUNTER — Telehealth: Payer: Self-pay | Admitting: *Deleted

## 2018-02-08 NOTE — Telephone Encounter (Signed)
Oncology Nurse Navigator Documentation  In follow-up to patient's 12/9 referral from ENT Melida Quitter, [facility], placed new referral introductory call to Mr. Buttery.  No answer, unable to leave VMM.  Gayleen Orem, RN, BSN Head & Neck Oncology Nurse [facility] [phone]

## 2018-02-09 ENCOUNTER — Telehealth: Payer: Self-pay | Admitting: *Deleted

## 2018-02-09 NOTE — Telephone Encounter (Signed)
Oncology Nurse Navigator Documentation  Placed introductory call to new referral patient Mr. Caraher.    Introduced myself as the H&N oncology nurse navigator that works with Dr. Isidore Moos to whom he has been referred by Dr. Redmond Baseman.  He confirmed understanding of referral.  He accepted 12/18 9:00 appt with Dr. Isidore Moos preceded by 8:30 Nurse Eval.   Briefly explained my role as his navigator, indicated I would be joining him during appt next week.  Confirmed his understanding of [facility] location, explained arrival and RadOnc registration process.  Provided my contact information, encouraged him to call with questions/concerns before next week.  He verbalized understanding of information provided, expressed appreciation for my call.  Navigator Needs Assessment . Employment status:  Retired . Support system:  Sister . Transportation:  Drives self. Marland Kitchen PCP:  Yes.  Sees him several times yearly.  Unsure if he is aware of dx.  PCD:  Yes.  Sees twice yearly.  Has own teeth, missing several back molars.   Navigator Interventions  Sent IB to Dental Medicine with request for pre-radiotherapy dental eval.

## 2018-02-10 ENCOUNTER — Telehealth (HOSPITAL_COMMUNITY): Payer: Self-pay

## 2018-02-11 NOTE — Progress Notes (Signed)
Histology and Location of Primary Skin Cancer:  01/19/18 Diagnosis 1. Skin , right neck 12 o'clock to 2 o'clock margin 12 TO 2 O'CLOCK MARGIN: INCIDENTAL COMPOUND NEVUS, BENIGN, NO SQUAMOUS CELL CARCINOMA SEEN. 2. Skin , right neck 3 o'clock to 5 o'clock margin 3 TO 5 O'CLOCK MARGIN: NO SQUAMOUS CELL CARCINOMA SEEN. 3. Skin , right neck 6 o'clock to 8 o'clock margin 6 TO 8 O'CLOCK MARGIN: NO SQUAMOUS CELL CARCINOMA SEEN. 4. Skin , right neck 8 o'clock to 9 o'clock margin 8 TO 9 O'CLOCK MARGIN: NO SQUAMOUS CELL CARCINOMA SEEN. 5. Skin , right neck 9 o'clock to 10 o'clock margin 9 TO 10 O'CLOCK MARGIN: NO SQUAMOUS CELL CARCINOMA SEEN. 6. Skin , right neck 10 o'clock to 11 o'clock 10 TO 11 O'CLOCK MARGIN: NO SQUAMOUS CELL CARCINOMA SEEN. 7. Skin , right neck WELL TO MODERATELY DIFFERENTIATED SQUAMOUS CELL CARCINOMA, INVASIVE, DEEP MARGINS FREE; NOTE PERINEURAL INVASION (SEE DESCRIPTION) 8. Lymph nodes, radical neck dissection, right neck zones 2,3,5 ZERO OUT OF NINETEEN LYMPH NODES, NEGATIVE FOR CARCINOMA 9. Soft tissue, biopsy, right neck additional left zone 2 ONE LYMPH NODE, BISECTED, NEGATIVE FOR CARCINOMA, SEE DESCRIPTION (TOTAL: 0/20 LYMPH NODES, NEGATIVE FOR SQUAMOUS CELL CARCINOMA)  [name] presented with the following signs/symptoms: He presented with a long history of right sided neck pain and and ulcerative area that developed over the past several months to Dr. Redmond Baseman.   Past/Anticipated interventions by patient's surgeon/dermatologist for current problematic lesion, if any:  PROCEDURE: 1.  Wide local excision of right neck skin cancer, 5 cm. 2.  Right modified radical neck dissection. 3.  Cervical pectoral flap. SURGEON:  Melida Quitter, MD.  Past skin cancers, if any:  1) Location/Histology/Intervention: history of a forehead cancer.  2) Location/Histology/Intervention:   3) Location/Histology/Intervention:   History of Blistering sunburns, if any:  N/A   Nutrition Status Yes No Comments  Weight changes? [x]  []  He has lost about 5-6 lbs since surgery 11/19  Swallowing concerns? []  [x]    PEG? []  [x]       SAFETY ISSUES:  Prior radiation? No  Pacemaker/ICD? No  Possible current pregnancy? No  Is the patient on methotrexate? No  Current Complaints / other details:    BP 128/80 (BP Location: Right Arm, Patient Position: Sitting)   Pulse 91   Temp 98.4 F (36.9 C) (Oral)   Resp 20   Ht 5\' 10"  (1.778 m)   Wt 229 lb 12.8 oz (104.2 kg)   SpO2 95%   BMI 32.97 kg/m    Wt Readings from Last 3 Encounters:  02/16/18 229 lb 12.8 oz (104.2 kg)  01/19/18 235 lb 14.3 oz (107 kg)  01/14/18 236 lb 1.6 oz (107.1 kg)

## 2018-02-14 ENCOUNTER — Encounter (HOSPITAL_COMMUNITY): Payer: Self-pay | Admitting: Dentistry

## 2018-02-14 ENCOUNTER — Ambulatory Visit (HOSPITAL_COMMUNITY): Payer: Self-pay | Admitting: Dentistry

## 2018-02-14 VITALS — BP 118/79 | HR 84 | Temp 97.8°F

## 2018-02-14 DIAGNOSIS — K083 Retained dental root: Secondary | ICD-10-CM

## 2018-02-14 DIAGNOSIS — C760 Malignant neoplasm of head, face and neck: Secondary | ICD-10-CM

## 2018-02-14 DIAGNOSIS — K036 Deposits [accretions] on teeth: Secondary | ICD-10-CM

## 2018-02-14 DIAGNOSIS — K030 Excessive attrition of teeth: Secondary | ICD-10-CM

## 2018-02-14 DIAGNOSIS — K06010 Localized gingival recession, unspecified: Secondary | ICD-10-CM

## 2018-02-14 DIAGNOSIS — Z01818 Encounter for other preprocedural examination: Secondary | ICD-10-CM

## 2018-02-14 DIAGNOSIS — K045 Chronic apical periodontitis: Secondary | ICD-10-CM

## 2018-02-14 DIAGNOSIS — M264 Malocclusion, unspecified: Secondary | ICD-10-CM

## 2018-02-14 DIAGNOSIS — K08409 Partial loss of teeth, unspecified cause, unspecified class: Secondary | ICD-10-CM

## 2018-02-14 DIAGNOSIS — K029 Dental caries, unspecified: Secondary | ICD-10-CM

## 2018-02-14 DIAGNOSIS — M270 Developmental disorders of jaws: Secondary | ICD-10-CM

## 2018-02-14 DIAGNOSIS — K0530 Chronic periodontitis, unspecified: Secondary | ICD-10-CM

## 2018-02-14 MED ORDER — SODIUM FLUORIDE 1.1 % DT CREA: TOPICAL_CREAM | DENTAL | 99 refills | Status: DC

## 2018-02-14 NOTE — Patient Instructions (Addendum)

## 2018-02-15 ENCOUNTER — Encounter (HOSPITAL_COMMUNITY): Payer: Self-pay | Admitting: Dentistry

## 2018-02-15 NOTE — Progress Notes (Signed)
DENTAL CONSULTATION  Date of Consultation:  02/14/2018 Patient Name:   [name] Date of Birth:   1932/11/18 Medical Record Number: [id number]  VITALS: BP 118/79 (BP Location: Right Arm)   Pulse 84   Temp 97.8 F (36.6 C)   CHIEF COMPLAINT: Patient was referred by Dr. Isidore Moos for a dental consultation.  HPI: [name] is an 84 year old male with head and neck cancer. Patient is status post right modified neck dissection with Dr. Redmond Baseman on 01/19/2018. Patient with anticipated postoperative radiation therapy with Dr. Isidore Moos. The patient is now seen as part of a medically necessary preradiation therapy dental protocol examination.  The patient currently denies acute toothaches, swellings, or abscesses. The patient was recently seen by his primary dentist in August of 2019 for an exam and cleaning. This was with Dr. Wendall Papa with [facility]. Patient denies having any partial dentures at this time. Patient did have a mandibular partial denture fabricated 12-15 years ago that he no longer wears. Patient denies having dental phobia.  PROBLEM LIST: Patient Active Problem List   Diagnosis Date Noted  . Skin cancer of scalp or skin of neck 01/19/2018  . Abnormal chest x-ray 02/17/2011  . Osteoarthritis 11/18/2010  . Pulmonary embolism ([facility]) 08/15/2010  . Chronic anticoagulation 08/15/2010  . Lung nodule 08/15/2010  . Cor pulmonale ([facility]) 08/15/2010  . Dyspnea 07/25/2010  . Glaucoma   . Back pain   . Skin cancer   . Insomnia     PMH: Past Medical History:  Diagnosis Date  . Back pain   . DVT (deep venous thrombosis) ([facility])   . Glaucoma   . Hypertension   . Insomnia   . Pulmonary embolism, bilateral [facility]) May 2012   on coumadin  . Skin cancer    forehead  . Skin cancer   . Vertigo     PSH: Past Surgical History:  Procedure Laterality Date  . BACK SURGERY     x3  . EXCISION MASS NECK Right 01/19/2018   Procedure: Wide local excision of right neck skin cancer (5  cm);  Surgeon: Melida Quitter, MD;  Location: [facility];  Service: ENT;  Laterality: Right;  . RADICAL NECK DISSECTION Right 01/19/2018    Right modified radical neck dissection; Cervicopectoral flap (Right Neck)  . RADICAL NECK DISSECTION Right 01/19/2018   Procedure: Right modified radical neck dissection; Cervicopectoral flap;  Surgeon: Melida Quitter, MD;  Location: [facility];  Service: ENT;  Laterality: Right;  . SHOULDER SURGERY     x3  Dr. Karrie Doffing al.  . TOTAL HIP ARTHROPLASTY  2012   Dr. Alvan Dame right hip  . TRANSTHORACIC ECHOCARDIOGRAM  07/26/2010   Left ventricle: The cavity size was normal. Wall thickness was increased in a pattern of moderate LVH. Systolic function was normal. The estimated ejection fraction was in the range of 55% to 60%    ALLERGIES: Allergies  Allergen Reactions  . Codeine Nausea And Vomiting  . Morphine And Related Nausea And Vomiting    MEDICATIONS: Current Outpatient Medications  Medication Sig Dispense Refill  . Ascorbic Acid (VITAMIN C) 1000 MG tablet Take 1,000 mg by mouth daily.      Marland Kitchen aspirin 81 MG tablet Take 81 mg by mouth daily.     . cholecalciferol (VITAMIN D3) 25 MCG (1000 UT) tablet Take 1,000 Units by mouth daily.     . diphenhydrAMINE (BENADRYL) 50 MG capsule Take 50 mg by mouth daily.    Marland Kitchen GLUCOSAMINE-CHONDROITIN PO Take  1 tablet by mouth daily.    Marland Kitchen L-Arginine 1000 MG TABS Take 1,000 mg by mouth daily.    Marland Kitchen latanoprost (XALATAN) 0.005 % ophthalmic solution Place 1 drop into both eyes at bedtime.     Marland Kitchen losartan (COZAAR) 100 MG tablet Take 1 tablet (100 mg total) by mouth daily. NEED OV. 30 tablet 0  . meclizine (ANTIVERT) 25 MG tablet Take 25 mg by mouth every 6 (six) hours as needed for dizziness.     . mirtazapine (REMERON) 30 MG tablet Take 30 mg by mouth at bedtime.    . Red Yeast Rice 600 MG CAPS Take 1,200 mg by mouth daily.     . Saw Palmetto, Serenoa repens, (SAW PALMETTO PO) Take 1,800 mg by mouth daily.    . vitamin E 400 UNIT capsule  Take 400 Units by mouth daily.     . sodium fluoride (PREVIDENT 5000 PLUS) 1.1 % CREA dental cream Apply to tooth brush. Brush teeth for 2 minutes. Spit out excess. DO NOT rinse afterwards. Repeat nightly. 1 Tube prn   No current facility-administered medications for this visit.     LABS: Lab Results  Component Value Date   WBC 6.9 01/14/2018   HGB 13.9 01/19/2018   HCT 41.0 01/19/2018   MCV 99.1 01/14/2018   PLT 303 01/14/2018      Component Value Date/Time   NA 140 01/19/2018 0847   K 3.5 01/19/2018 0847   CL 106 01/14/2018 1419   CO2 22 01/14/2018 1419   GLUCOSE 119 (H) 01/19/2018 0847   BUN 17 01/14/2018 1419   CREATININE 1.02 01/14/2018 1419   CALCIUM 9.0 01/14/2018 1419   GFRNONAA >60 01/14/2018 1419   GFRAA >60 01/14/2018 1419   Lab Results  Component Value Date   INR 2.27 (H) 08/01/2010   INR 2.30 (H) 07/31/2010   INR 1.88 (H) 07/30/2010   No results found for: PTT  SOCIAL HISTORY: Social History   Socioeconomic History  . Marital status: Divorced    Spouse name: Not on file  . Number of children: Not on file  . Years of education: Not on file  . Highest education level: Not on file  Occupational History  . Not on file  Social Needs  . Financial resource strain: Not on file  . Food insecurity:    Worry: Not on file    Inability: Not on file  . Transportation needs:    Medical: Not on file    Non-medical: Not on file  Tobacco Use  . Smoking status: Former Smoker    Packs/day: 1.00    Years: 25.00    Pack years: 25.00    Types: Cigarettes    Last attempt to quit: 03/02/1981    Years since quitting: 36.9  . Smokeless tobacco: Former Systems developer    Types: [place] date: 03/02/2008  Substance and Sexual Activity  . Alcohol use: Yes    Comment: wine 3-4 times a week  . Drug use: No  . Sexual activity: Not on file  Lifestyle  . Physical activity:    Days per week: Not on file    Minutes per session: Not on file  . Stress: Not on file   Relationships  . Social connections:    Talks on phone: Not on file    Gets together: Not on file    Attends religious service: Not on file    Active member of club or organization: Not on file  Attends meetings of clubs or organizations: Not on file    Relationship status: Not on file  . Intimate partner violence:    Fear of current or ex partner: Not on file    Emotionally abused: Not on file    Physically abused: Not on file    Forced sexual activity: Not on file  Other Topics Concern  . Not on file  Social History Narrative  . Not on file    FAMILY HISTORY: Family History  Problem Relation Age of Onset  . Stroke Father   . Alzheimer's disease Mother   . Lung cancer Brother     REVIEW OF SYSTEMS: Reviewed with the patient as per History of present illness. Psych: Patient denies having dental phobia.  DENTAL HISTORY: HIEF COMPLAINT: Patient was referred by Dr. Isidore Moos for a dental consultation.  HPI: [name] is an 84 year old male with head and neck cancer. Patient is status post right modified neck dissection with Dr. Redmond Baseman on 01/19/2018. Patient with anticipated postoperative radiation therapy with Dr. Isidore Moos. The patient is now seen as part of a medically necessary preradiation therapy dental protocol examination.  The patient currently denies acute toothaches, swellings, or abscesses. The patient was recently seen by his primary dentist in August of 2019 for an exam and cleaning. This was with Dr. Wendall Papa with [facility]. Patient denies having any partial dentures at this time. Patient did have a mandibular partial denture fabricated 12-15 years ago that he no longer wears. Patient denies having dental phobia.  DENTAL EXAMINATION: GENERAL:  The patient is a well-developed, well-nourished male in no acute distress. HEAD AND NECK: The right neck is consistent with previous modified neck dissection with Dr. Redmond Baseman.There is no palpable left neck  lymphadenopathy.  The patient has a skin lesion on the right infraorbital skin that is scheduled to be removed by Dr. Denna Haggard tomorrow. INTRAORAL EXAM:  The patient has normal saliva. The patient has a mandibular right torus. DENTITION:  Patient is missing tooth numbers 1, 2, 4, 13 through 16, 18, 19,and 29-32. Patient is a retained root segment #12 and 21. PERIODONTAL:  The patient has chronic periodontitis with plaque and calculus accumulations, gingival recession, and no significant tooth mobility. There is incipient to moderate bone loss noted. Periodontal charting was not performed secondary to questionable need for antibiotic premedication for the previous total hip replacement. DENTAL CARIES/SUBOPTIMAL RESTORATIONS:  Excessive attrition and dental caries are noted as per dental charting form. ENDODONTIC:  The patient currently denies acute pulpitis symptoms. The patient does appear to have a periapical radiolucency at the apex of tooth #12.  Patient has a previous root canal therapy associated with tooth #21 with no obvious persistent periapical pathology or radiolucency. CROWN AND BRIDGE:  Patient has crowns on tooth numbers 3, 5, and 20. There appears to be recurrent caries on the mesial of #5. PROSTHODONTIC:  Patient has a history of previous lower partial denture made approximately 12-15 years ago. Patient no longer wears the partial denture. OCCLUSION:  The patient has a poor occlusal scheme secondary to multiple missing teeth, excessive attrition of the maxillary and mandibular teeth, supra-eruption and drifting of the unopposed teeth into the edentulous areas, and lack of replacement of missing teeth with dental prostheses.  RADIOGRAPHIC INTERPRETATION: An orthopantogram was taken and supplemented with 4 periapical radiographs. Patient would not allow additional periapical radiographs to be taken at this time. There are multiple missing teeth. There are retained root segment in the area  tooth numbers 12 and 21. There is a periapical radiolucency at the apex of tooth #12. There is excessive attrition noted. Dental caries are noted. Tooth #21 has a previous root canal therapy. There is supra-eruption and drifting of the unopposed teeth into the edentulous areas  ASSESSMENTS: 1. Head and neck cancer 2. Preradiation therapy dental protocol 3. Chronic apical periodontitis 4. Retained root segments 5. Dental caries 6. Excessive attrition 7. Chronic periodontitis with bone loss 8. Gingival recession 9. Accretions 10. Mandibular right torus 11. Multiple missing teeth 12.Supra-eruption and drifting of the unopposed teeth into the edentulous areas 13. Poor occlusal scheme and malocclusion 14. Questionable need for antibiotic premedication prior to invasive dental procedures due to previous total hip replacement   PLAN/RECOMMENDATIONS: 1. I discussed the risks, benefits, and complications of various treatment options with the patient in relationship to his medical and dental conditions, anticipated radiation therapy, and radiation therapy side effects to include xerostomia, radiation caries, trismus, mucositis, taste changes, gum and jawbone changes, and risk for infection and osteoradionecrosis.  We discussed various treatment options to include no treatment, multiple extractions with alveoloplasty, pre-prosthetic surgery as indicated, periodontal therapy, dental restorations, root canal therapy, crown and bridge therapy, implant therapy, and replacement of missing teeth as indicated.  I am still awaiting the anticipated ports and doses for the postoperative radiation therapy from Dr. Isidore Moos. Dr. Isidore Moos scheduled to see the patient on Wednesday, 02/16/2018.  I will then need to discuss possible extractions of teeth in the primary field of radiation therapy at that time. Patient most likely will need to consider extraction of tooth numbers 5, 12, and 21 with an oral surgeon.  We also  discussed extraction of remaining maxillary teeth due to the excessive attrition noted and the potential for future pulpal exposure and dental infection.  The patient is currently adamant about not having extraction of his remaining maxillary teeth at this time, but is willing to discuss this with an oral surgeon for a second opinion.  The patient did agree to impressions today for the future fabrication of fluoride trays and scatter protection devices. A prescription for PreviDent 5000 Plus was sent to [facility] with refills for one year. An additional prescription was given to the patient to forward to his mail-order pharmacy.  Dr. Cristi Loron will be contacted to discuss future consultation with an oral surgeon concerning extraction of teeth prior to radiation therapy.  2. Discussion of findings with medical team and coordination of future medical and dental care as needed.  I spent in excess of 150 minutes during the conduct of this consultation and >50% of this time involved direct face-to-face encounter for counseling and/or coordination of the patient's care.    Lenn Cal, DDS

## 2018-02-16 ENCOUNTER — Other Ambulatory Visit: Payer: Self-pay

## 2018-02-16 ENCOUNTER — Ambulatory Visit
Admission: RE | Admit: 2018-02-16 | Discharge: 2018-02-16 | Disposition: A | Discharge status: Home / Self Care | Payer: Medicare Other | Source: Ambulatory Visit | Attending: Radiation Oncology | Admitting: Radiation Oncology

## 2018-02-16 ENCOUNTER — Encounter: Payer: Self-pay | Admitting: *Deleted

## 2018-02-16 ENCOUNTER — Encounter: Payer: Self-pay | Admitting: Radiation Oncology

## 2018-02-16 DIAGNOSIS — M549 Dorsalgia, unspecified: Secondary | ICD-10-CM | POA: Diagnosis not present

## 2018-02-16 DIAGNOSIS — Z85828 Personal history of other malignant neoplasm of skin: Secondary | ICD-10-CM | POA: Insufficient documentation

## 2018-02-16 DIAGNOSIS — Z7982 Long term (current) use of aspirin: Secondary | ICD-10-CM | POA: Insufficient documentation

## 2018-02-16 DIAGNOSIS — Z86711 Personal history of pulmonary embolism: Secondary | ICD-10-CM | POA: Diagnosis not present

## 2018-02-16 DIAGNOSIS — C4440 Unspecified malignant neoplasm of skin of scalp and neck: Secondary | ICD-10-CM

## 2018-02-16 DIAGNOSIS — Z801 Family history of malignant neoplasm of trachea, bronchus and lung: Secondary | ICD-10-CM | POA: Diagnosis not present

## 2018-02-16 DIAGNOSIS — I10 Essential (primary) hypertension: Secondary | ICD-10-CM | POA: Diagnosis not present

## 2018-02-16 DIAGNOSIS — Z79899 Other long term (current) drug therapy: Secondary | ICD-10-CM | POA: Insufficient documentation

## 2018-02-16 DIAGNOSIS — R634 Abnormal weight loss: Secondary | ICD-10-CM | POA: Insufficient documentation

## 2018-02-16 DIAGNOSIS — Z87891 Personal history of nicotine dependence: Secondary | ICD-10-CM | POA: Insufficient documentation

## 2018-02-16 DIAGNOSIS — Z86718 Personal history of other venous thrombosis and embolism: Secondary | ICD-10-CM | POA: Insufficient documentation

## 2018-02-16 NOTE — Progress Notes (Signed)
Radiation Oncology         [phone] ________________________________  Initial Outpatient Consultation  Name: [name] MRN: [id number]  Date: 02/16/2018  DOB: [birth year]  XT:[id number], Curt Jews, MD  Melida Quitter, MD   REFERRING PHYSICIAN: Melida Quitter, MD  DIAGNOSIS:    ICD-10-CM   1. Skin cancer of scalp or skin of neck C44.40   Cancer Staging Skin cancer of scalp or skin of neck Staging form: Cutaneous Carcinoma of the Head and Neck, AJCC 8th Edition - Clinical stage from 02/16/2018: Stage III (cT3, cN0, cM0) - Signed by Eppie Gibson, MD on 02/16/2018   CHIEF COMPLAINT: Here to discuss management of skin cancer  HISTORY OF PRESENT ILLNESS::[name] is an 84 y.o. male who presented to Dr. Redmond Baseman on 12/21/17 with a long history of recurrent right-sided neck pain. The pain worsened and the skin started looking abnormal about 4 months ago. He had a biopsy done by his dermatologist that demonstrated skin cancer. He was referred to a Moh's surgeon at that time who felt that the area needed formal surgical treatment, and thus was referred to ENT.   He had a CT scan of the neck done on 12/30/17 that showed a spiculated and fungating mass contiguous from the dermis, growing deep through the right sternocleidomastoid muscle and with lepidic growth along the skin surface. The mass encompassed nearly 50 x 27 x 37 mm. There were also asymmetric and suspicious right level 2A, inferior parotid region, and supraclavicular fossa lymph nodes measuring up to 10 mm. No distant metastatic disease identified in the neck or upper chest.   Clinically patient showed signs of spinal accessory nerve involvement per ENT exam.  He subsequently underwent wide local excision of right neck skin cancer and right neck dissection on 01/19/18. Final pathology revealed well to moderately differentiated squamous cell carcinoma, invasive, with the peripheral and deep margins free of malignancy. However,  perineural invasion was seen where the diameter of the largest nerve twig was 1.12 mm with circumferential involvement by squamous cell carcinoma. A total of 20 lymph nodes were all negative for squamous cell carcinoma.   The patient reviewed these results with Dr. Redmond Baseman and has been referred today for discussion of potential radiation treatment options. We are joined in consultation by Gayleen Orem, RN, our Head and Neck Navigator.  On review of systems, the patient reports a 5-6 pound weight loss since his surgery 4 weeks ago; denies any appetite loss. He reports difficulty with right shoulder abduction and has right shoulder pain that he describes as an Customer service manager charge"; he does not remember when this pain started. He is applying an antibiotic ointment to the surgical site. He reports history of skin cancer to forehead.  The patient has a history of smoking, quit 30 years ago.  PREVIOUS RADIATION THERAPY: No  PAST MEDICAL HISTORY:  has a past medical history of Back pain, DVT (deep venous thrombosis) ([place]), Glaucoma, Hypertension, Insomnia, Pulmonary embolism, bilateral [facility]) (May 2012), Skin cancer, Skin cancer, and Vertigo.    PAST SURGICAL HISTORY: Past Surgical History:  Procedure Laterality Date  . BACK SURGERY     x3  . EXCISION MASS NECK Right 01/19/2018   Procedure: Wide local excision of right neck skin cancer (5 cm);  Surgeon: Melida Quitter, MD;  Location: [place];  Service: ENT;  Laterality: Right;  . RADICAL NECK DISSECTION Right 01/19/2018    Right modified radical neck dissection; Cervicopectoral flap (Right Neck)  . RADICAL NECK  DISSECTION Right 01/19/2018   Procedure: Right modified radical neck dissection; Cervicopectoral flap;  Surgeon: Melida Quitter, MD;  Location: [place];  Service: ENT;  Laterality: Right;  . SHOULDER SURGERY     x3  Dr. Karrie Doffing al.  . TOTAL HIP ARTHROPLASTY  2012   Dr. Alvan Dame right hip  . TRANSTHORACIC ECHOCARDIOGRAM  07/26/2010   Left ventricle: The  cavity size was normal. Wall thickness was increased in a pattern of moderate LVH. Systolic function was normal. The estimated ejection fraction was in the range of 55% to 60%    FAMILY HISTORY: family history includes Alzheimer's disease in his mother; Lung cancer in his brother; Stroke in his father.  SOCIAL HISTORY:  reports that he quit smoking about 36 years ago. His smoking use included cigarettes. He has a 25.00 pack-year smoking history. He quit smokeless tobacco use about 9 years ago.  His smokeless tobacco use included chew. He reports current alcohol use. He reports that he does not use drugs. Native of [place].  ALLERGIES: Codeine and Morphine and related  MEDICATIONS:  Current Outpatient Medications  Medication Sig Dispense Refill  . Ascorbic Acid (VITAMIN C) 1000 MG tablet Take 1,000 mg by mouth daily.      Marland Kitchen aspirin 81 MG tablet Take 81 mg by mouth daily.     . cholecalciferol (VITAMIN D3) 25 MCG (1000 UT) tablet Take 1,000 Units by mouth daily.     . diphenhydrAMINE (BENADRYL) 50 MG capsule Take 50 mg by mouth daily.    Marland Kitchen GLUCOSAMINE-CHONDROITIN PO Take 1 tablet by mouth daily.    Marland Kitchen L-Arginine 1000 MG TABS Take 1,000 mg by mouth daily.    Marland Kitchen latanoprost (XALATAN) 0.005 % ophthalmic solution Place 1 drop into both eyes at bedtime.     Marland Kitchen losartan (COZAAR) 100 MG tablet Take 1 tablet (100 mg total) by mouth daily. NEED OV. 30 tablet 0  . meclizine (ANTIVERT) 25 MG tablet Take 25 mg by mouth every 6 (six) hours as needed for dizziness.     . mirtazapine (REMERON) 30 MG tablet Take 30 mg by mouth at bedtime.    . Red Yeast Rice 600 MG CAPS Take 1,200 mg by mouth daily.     . Saw Palmetto, Serenoa repens, (SAW PALMETTO PO) Take 1,800 mg by mouth daily.    . sodium fluoride (PREVIDENT 5000 PLUS) 1.1 % CREA dental cream Apply to tooth brush. Brush teeth for 2 minutes. Spit out excess. DO NOT rinse afterwards. Repeat nightly. 1 Tube prn  . vitamin E 400 UNIT capsule Take 400 Units  by mouth daily.      No current facility-administered medications for this encounter.     REVIEW OF SYSTEMS:  A 10+ POINT REVIEW OF SYSTEMS WAS OBTAINED including neurology, dermatology, psychiatry, cardiac, respiratory, lymph, extremities, GI, GU, Musculoskeletal, constitutional, breasts, reproductive, HEENT.  All pertinent positives are noted in the HPI.  All others are negative.   PHYSICAL EXAM:  height is 5\' 10"  (1.778 m) and weight is 229 lb 12.8 oz (104.2 kg). His oral temperature is 98.4 F (36.9 C). His blood pressure is 128/80 and his pulse is 91. His respiration is 20 and oxygen saturation is 95%.   General: Alert and oriented, in no acute distress. HEENT: Head is normocephalic. Extraocular movements are intact. Missing some of his teeth. Some teeth are broken. Good gag reflex. No visible lesions in his throat. Neck: He still has a superficial area that is about 2.5 cm  in dimension that is open along the right neck scar.  He has a superficial oval-shaped mass measuring 1.5 cm in the right posterior neck, may be reactive lymph node. No other masses appreciated in the neck bilaterally.  Heart: Regular in rate and rhythm with no murmurs, rubs, or gallops. Chest: Clear to auscultation bilaterally, with no rhonchi, wheezes, or rales. Abdomen: Soft, nontender, nondistended, with no rigidity or guarding. Extremities: Mild edema in the lower extremities bilaterally. Lymphatics: see Neck Exam Skin: No concerning lesions. Musculoskeletal: He has some mild weakness in abduction in the right shoulder. Otherwise symmetric strength and muscle tone throughout. Neurologic: Cranial nerves II through XII are grossly intact. No obvious focalities. Speech is fluent. Coordination is intact.  Psychiatric: Judgment and insight are intact. Affect is appropriate.   ECOG =  0  0 - Asymptomatic (Fully active, able to carry on all predisease activities without restriction)  1 - Symptomatic but completely  ambulatory (Restricted in physically strenuous activity but ambulatory and able to carry out work of a light or sedentary nature. For example, light housework, office work)  2 - Symptomatic, <50% in bed during the day (Ambulatory and capable of all self care but unable to carry out any work activities. Up and about more than 50% of waking hours)  3 - Symptomatic, >50% in bed, but not bedbound (Capable of only limited self-care, confined to bed or chair 50% or more of waking hours)  4 - Bedbound (Completely disabled. Cannot carry on any self-care. Totally confined to bed or chair)  5 - Death   Eustace Pen MM, Creech RH, Tormey DC, et al. [phone]). "Toxicity and response criteria of the [facility]". [place] Oncol. 5 (6): 649-55   LABORATORY DATA:  Lab Results  Component Value Date   WBC 6.9 01/14/2018   HGB 13.9 01/19/2018   HCT 41.0 01/19/2018   MCV 99.1 01/14/2018   PLT 303 01/14/2018   CMP     Component Value Date/Time   NA 140 01/19/2018 0847   K 3.5 01/19/2018 0847   CL 106 01/14/2018 1419   CO2 22 01/14/2018 1419   GLUCOSE 119 (H) 01/19/2018 0847   BUN 17 01/14/2018 1419   CREATININE 1.02 01/14/2018 1419   CALCIUM 9.0 01/14/2018 1419   PROT 7.1 07/25/2010 1910   ALBUMIN 3.6 07/25/2010 1910   AST 17 07/25/2010 1910   ALT 13 07/25/2010 1910   ALKPHOS 90 07/25/2010 1910   BILITOT 0.5 07/25/2010 1910   GFRNONAA >60 01/14/2018 1419   GFRAA >60 01/14/2018 1419     Lab Results  Component Value Date   TSH 0.922 07/26/2010      RADIOGRAPHY:  As above - I personally reviewed images at tumor board this AM    IMPRESSION/PLAN: Skin cancer, advanced locally, post op  The patient is healing well from surgery. He was advised to continue using his antibiotic ointment to the surgical site.  We discussed starting radiotherapy 6 weeks out from surgery, on or around March 03, 2018. We discussed that radiation would take about 6 weeks to complete and would be  directed to the right neck. He is scheduled for CT simulation on Monday, December 23rd at 2:00 PM.  We discussed the risks, benefits, and side effects of radiotherapy. Side effects include but are not limited to: skin irritation, fatigue, taste changes, thick saliva, and minimal hair loss. No guarantees of treatment were given. A consent form was signed and placed in the patient's  medical record. The patient is enthusiastic about proceeding with treatment. I look forward to participating in the patient's care.  We also discussed that this treatment is a multidisciplinary process to maximize treatment outcomes and quality of life. For this reason the following referrals have been or will be made:  1. The patient has seen dentistry. I spoke with Dr. Enrique Sack and let him know the patient won't receive significant dose to his tooth roots.  2..Nutritionist for nutrition support during and after treatment.  3. Social work for social support and financial counseling.   4. Defer to PCP to check TSH annually. Unlikely to be affected by RT.   __________________________________________   Eppie Gibson, MD  This document serves as a record of services personally performed by Eppie Gibson, MD. It was created on her behalf by Rae Lips, a trained medical scribe. The creation of this record is based on the scribe's personal observations and the provider's statements to them. This document has been checked and approved by the attending provider.

## 2018-02-17 ENCOUNTER — Other Ambulatory Visit: Payer: Self-pay | Admitting: Dermatology

## 2018-02-17 ENCOUNTER — Telehealth: Payer: Self-pay | Admitting: Radiation Oncology

## 2018-02-17 NOTE — Telephone Encounter (Signed)
Unable to reach patient per 12/18 sch message to schedule nutrition appt - no vmail

## 2018-02-21 ENCOUNTER — Ambulatory Visit
Admission: RE | Admit: 2018-02-21 | Discharge: 2018-02-21 | Disposition: A | Discharge status: Home / Self Care | Payer: Medicare Other | Source: Ambulatory Visit | Attending: Radiation Oncology | Admitting: Radiation Oncology

## 2018-02-21 DIAGNOSIS — C4440 Unspecified malignant neoplasm of skin of scalp and neck: Secondary | ICD-10-CM | POA: Insufficient documentation

## 2018-02-22 NOTE — Progress Notes (Signed)
Head and Neck Cancer Simulation, IMRT treatment planning note   Outpatient  Diagnosis:    ICD-10-CM   1. Skin cancer of scalp or skin of neck C44.40     The patient was taken to the CT simulator and laid in the supine position on the table. An Aquaplast head and shoulder mask was custom fitted to the patient's anatomy. High-resolution CT axial imaging was obtained of the head and neck with contrast. I verified that the quality of the imaging is good for treatment planning. 1 Medically Necessary Treatment Device was fabricated and supervised by me: Aquaplast mask.  I also placed custom bolus over his right neck scar.   Treatment planning note I plan to treat the patient with IMRT. I plan to treat the patient's right neck. I plan to treat to a total dose of 60 Gray in 30  fractions. Dose calculation was ordered from dosimetry.  IMRT planning Note  IMRT is medically necessary and an important modality to deliver adequate dose to the patient's at risk tissues while sparing the patient's normal structures, including the: esophagus, parotid tissue, mandible, brain stem, spinal cord, oral cavity, brachial plexus.  This justifies the use of IMRT in the patient's treatment.    -----------------------------------  Eppie Gibson, MD

## 2018-02-25 DIAGNOSIS — C4440 Unspecified malignant neoplasm of skin of scalp and neck: Secondary | ICD-10-CM | POA: Diagnosis not present

## 2018-02-28 NOTE — Progress Notes (Signed)
Oncology Nurse Navigator Documentation  Met with patient during initial consult with Dr. Isidore Moos.  He was unaccompanied.    . Further introduced myself as his Navigator, explained my role as a member of the Care Team.   . Provided New Patient Information packet, discussed contents: o Contact information for physician(s), myself, other members of the Care Team. o Advance Directive information ([place] blue pamphlet with LCSW contact info) o Fall Prevention Patient Safety Plan o Appointment Guideline o [place] [facility] map with highlight of [place] o SLP information sheet o Symptom Management Clinic information . Provided introductory explanation of radiation treatment including SIM planning and purpose of Aquaplast head and shoulder mask, showed him example.  He reported claustrophobia, stated preference for open-face mask. . Of note: . Son lives in [place].  Sister lives in [place], has early dementia, will not be able to provide transportation should he need. . Had appt with Dental Medicine Monday, rec'd Rx for Prevident. . Still healing from 01/19/18 R modified radical neck (Dr. Redmond Baseman). Marland Kitchen He voiced understanding of plan for 6 weeks RT to R neck with CT SIM later this week, probable RT Start 03/03/18. . I encouraged him to contact me with questions/concerns as treatments/procedures begin.  They verbalized understanding of information provided.    Gayleen Orem, RN, BSN Head & Neck Oncology Nurse [place] at [place] [phone]

## 2018-03-03 ENCOUNTER — Ambulatory Visit
Admission: RE | Admit: 2018-03-03 | Discharge: 2018-03-03 | Disposition: A | Discharge status: Home / Self Care | Payer: Medicare Other | Source: Ambulatory Visit | Attending: Radiation Oncology | Admitting: Radiation Oncology

## 2018-03-03 DIAGNOSIS — Z510 Encounter for antineoplastic radiation therapy: Secondary | ICD-10-CM | POA: Insufficient documentation

## 2018-03-03 DIAGNOSIS — C4440 Unspecified malignant neoplasm of skin of scalp and neck: Secondary | ICD-10-CM | POA: Diagnosis present

## 2018-03-04 ENCOUNTER — Ambulatory Visit: Payer: Medicare Other

## 2018-03-04 ENCOUNTER — Ambulatory Visit
Admission: RE | Admit: 2018-03-04 | Discharge: 2018-03-04 | Disposition: A | Discharge status: Home / Self Care | Payer: Medicare Other | Source: Ambulatory Visit | Attending: Radiation Oncology | Admitting: Radiation Oncology

## 2018-03-04 DIAGNOSIS — Z510 Encounter for antineoplastic radiation therapy: Secondary | ICD-10-CM | POA: Diagnosis not present

## 2018-03-07 ENCOUNTER — Telehealth (HOSPITAL_COMMUNITY): Payer: Self-pay

## 2018-03-07 ENCOUNTER — Telehealth: Payer: Self-pay | Admitting: *Deleted

## 2018-03-07 ENCOUNTER — Ambulatory Visit
Admission: RE | Admit: 2018-03-07 | Discharge: 2018-03-07 | Disposition: A | Discharge status: Home / Self Care | Payer: Medicare Other | Source: Ambulatory Visit | Attending: Radiation Oncology | Admitting: Radiation Oncology

## 2018-03-07 DIAGNOSIS — Z510 Encounter for antineoplastic radiation therapy: Secondary | ICD-10-CM | POA: Diagnosis not present

## 2018-03-07 DIAGNOSIS — C4440 Unspecified malignant neoplasm of skin of scalp and neck: Secondary | ICD-10-CM

## 2018-03-07 MED ORDER — SONAFINE EX EMUL
1.0000 "application " | Freq: Once | CUTANEOUS | Status: AC
  Administered 2018-03-07: 1 via TOPICAL

## 2018-03-07 NOTE — Progress Notes (Signed)

## 2018-03-07 NOTE — Telephone Encounter (Signed)
Called patient to ask about coming in @ 5 pm prior to tx. so Dr. Isidore Simon can see him, lvm for a return call

## 2018-03-08 ENCOUNTER — Ambulatory Visit
Admission: RE | Admit: 2018-03-08 | Discharge: 2018-03-08 | Disposition: A | Discharge status: Home / Self Care | Payer: Medicare Other | Source: Ambulatory Visit | Attending: Radiation Oncology | Admitting: Radiation Oncology

## 2018-03-08 DIAGNOSIS — Z510 Encounter for antineoplastic radiation therapy: Secondary | ICD-10-CM | POA: Diagnosis not present

## 2018-03-09 ENCOUNTER — Ambulatory Visit
Admission: RE | Admit: 2018-03-09 | Discharge: 2018-03-09 | Disposition: A | Discharge status: Home / Self Care | Payer: Medicare Other | Source: Ambulatory Visit | Attending: Radiation Oncology | Admitting: Radiation Oncology

## 2018-03-09 DIAGNOSIS — Z510 Encounter for antineoplastic radiation therapy: Secondary | ICD-10-CM | POA: Diagnosis not present

## 2018-03-10 ENCOUNTER — Ambulatory Visit
Admission: RE | Admit: 2018-03-10 | Discharge: 2018-03-10 | Disposition: A | Discharge status: Home / Self Care | Payer: Medicare Other | Source: Ambulatory Visit | Attending: Radiation Oncology | Admitting: Radiation Oncology

## 2018-03-10 ENCOUNTER — Encounter: Payer: Self-pay | Admitting: *Deleted

## 2018-03-10 DIAGNOSIS — Z510 Encounter for antineoplastic radiation therapy: Secondary | ICD-10-CM | POA: Diagnosis not present

## 2018-03-10 NOTE — Progress Notes (Signed)
Oncology Nurse Navigator Documentation  To provide support, encouragement and care continuity, met with [name] prior to RT. He acknowledged today is his 6th tmt (of 46), stated toleration of tmts without concern. I encouraged him to call me with needs/questions as tmts proceed.  He agreed.  Gayleen Orem, RN, BSN Head & Neck Oncology Nurse [place] at [place] [phone]

## 2018-03-11 ENCOUNTER — Ambulatory Visit
Admission: RE | Admit: 2018-03-11 | Discharge: 2018-03-11 | Disposition: A | Discharge status: Home / Self Care | Payer: Medicare Other | Source: Ambulatory Visit | Attending: Radiation Oncology | Admitting: Radiation Oncology

## 2018-03-11 DIAGNOSIS — Z510 Encounter for antineoplastic radiation therapy: Secondary | ICD-10-CM | POA: Diagnosis not present

## 2018-03-14 ENCOUNTER — Telehealth: Payer: Self-pay | Admitting: *Deleted

## 2018-03-14 ENCOUNTER — Ambulatory Visit
Admission: RE | Admit: 2018-03-14 | Discharge: 2018-03-14 | Disposition: A | Discharge status: Home / Self Care | Payer: Medicare Other | Source: Ambulatory Visit | Attending: Radiation Oncology | Admitting: Radiation Oncology

## 2018-03-14 DIAGNOSIS — Z510 Encounter for antineoplastic radiation therapy: Secondary | ICD-10-CM | POA: Diagnosis not present

## 2018-03-14 NOTE — Telephone Encounter (Signed)
Oncology Nurse Navigator Documentation  In follow-up to Mr. Shawgo call last Friday re RT appt conflict today, called him to encourage 12:00 arrival to Radiation Waiting to facilitate WUT with Dr. Isidore Moos prior to 12:45 RT.  He replied that will provide ample time for 2:00 appt with opthamologist.    Gayleen Orem, RN, BSN Head & Neck Oncology Nurse [place] at [place] [phone]

## 2018-03-15 ENCOUNTER — Ambulatory Visit
Admission: RE | Admit: 2018-03-15 | Discharge: 2018-03-15 | Disposition: A | Discharge status: Home / Self Care | Payer: Medicare Other | Source: Ambulatory Visit | Attending: Radiation Oncology | Admitting: Radiation Oncology

## 2018-03-15 DIAGNOSIS — Z510 Encounter for antineoplastic radiation therapy: Secondary | ICD-10-CM | POA: Diagnosis not present

## 2018-03-16 ENCOUNTER — Ambulatory Visit
Admission: RE | Admit: 2018-03-16 | Discharge: 2018-03-16 | Disposition: A | Discharge status: Home / Self Care | Payer: Medicare Other | Source: Ambulatory Visit | Attending: Radiation Oncology | Admitting: Radiation Oncology

## 2018-03-16 DIAGNOSIS — Z510 Encounter for antineoplastic radiation therapy: Secondary | ICD-10-CM | POA: Diagnosis not present

## 2018-03-17 ENCOUNTER — Ambulatory Visit
Admission: RE | Admit: 2018-03-17 | Discharge: 2018-03-17 | Disposition: A | Discharge status: Home / Self Care | Payer: Medicare Other | Source: Ambulatory Visit | Attending: Radiation Oncology | Admitting: Radiation Oncology

## 2018-03-17 ENCOUNTER — Other Ambulatory Visit (HOSPITAL_COMMUNITY): Payer: Self-pay | Admitting: Dentistry

## 2018-03-17 DIAGNOSIS — Z510 Encounter for antineoplastic radiation therapy: Secondary | ICD-10-CM | POA: Diagnosis not present

## 2018-03-18 ENCOUNTER — Ambulatory Visit
Admission: RE | Admit: 2018-03-18 | Discharge: 2018-03-18 | Disposition: A | Discharge status: Home / Self Care | Payer: Medicare Other | Source: Ambulatory Visit | Attending: Radiation Oncology | Admitting: Radiation Oncology

## 2018-03-18 DIAGNOSIS — Z510 Encounter for antineoplastic radiation therapy: Secondary | ICD-10-CM | POA: Diagnosis not present

## 2018-03-21 ENCOUNTER — Ambulatory Visit (HOSPITAL_COMMUNITY): Payer: Medicaid - Dental | Admitting: Dentistry

## 2018-03-21 ENCOUNTER — Encounter (HOSPITAL_COMMUNITY): Payer: Self-pay | Admitting: Dentistry

## 2018-03-21 ENCOUNTER — Ambulatory Visit
Admission: RE | Admit: 2018-03-21 | Discharge: 2018-03-21 | Disposition: A | Discharge status: Home / Self Care | Payer: Medicare Other | Source: Ambulatory Visit | Attending: Radiation Oncology | Admitting: Radiation Oncology

## 2018-03-21 VITALS — BP 121/78 | HR 72 | Temp 97.7°F | Wt 220.0 lb

## 2018-03-21 DIAGNOSIS — K08409 Partial loss of teeth, unspecified cause, unspecified class: Secondary | ICD-10-CM | POA: Diagnosis not present

## 2018-03-21 DIAGNOSIS — R682 Dry mouth, unspecified: Secondary | ICD-10-CM

## 2018-03-21 DIAGNOSIS — K029 Dental caries, unspecified: Secondary | ICD-10-CM | POA: Diagnosis not present

## 2018-03-21 DIAGNOSIS — K083 Retained dental root: Secondary | ICD-10-CM | POA: Diagnosis not present

## 2018-03-21 DIAGNOSIS — C760 Malignant neoplasm of head, face and neck: Secondary | ICD-10-CM

## 2018-03-21 DIAGNOSIS — K045 Chronic apical periodontitis: Secondary | ICD-10-CM | POA: Diagnosis not present

## 2018-03-21 DIAGNOSIS — Z510 Encounter for antineoplastic radiation therapy: Secondary | ICD-10-CM | POA: Diagnosis not present

## 2018-03-21 DIAGNOSIS — K117 Disturbances of salivary secretion: Secondary | ICD-10-CM

## 2018-03-21 NOTE — Progress Notes (Signed)
03/21/2018  Patient Name:   [name] Date of Birth:   1932/05/31 Medical Record Number: [id number]  BP 121/78 (BP Location: Right Arm)   Pulse 72   Temp 97.7 F (36.5 C)   Wt 220 lb (99.8 kg)   BMI 31.57 kg/m   [name] presents for oral examination during radiation therapy as well as insertion of upper and lower fluoride trays. Patient has completed 13/30 radiation treatments. No chemotherapy.  REVIEW OF CHIEF COMPLAINTS: DRY MOUTH: Yes. HARD TO SWALLOW: No HURT TO SWALLOW: No TASTE CHANGES:  Patient denies taste changes. SORES IN MOUTH: Patient denies having sores in his mouth. TRISMUS: no problems with trismus symptoms WEIGHT: 220 pounds down from initial 245 pounds  HOME OH REGIMEN:  BRUSHING: Twice daily. FLOSSING: daily RINSING: Not using any rinses. FLUORIDE: Patient is using PreviDent 5000 Plus on his toothbrush at bedtime. Patient will now start using the fluoride in the fluoride trays at bedtime as instructed. Alternatively, the patient may use the fluoride on the toothbrush as per previous instructions. TRISMUS EXERCISES:  Maximum interincisal opening: 45 mm  DENTAL EXAM:  Oral Hygiene:(PLAQUE): good oral hygiene. LOCATION OF MUCOSITIS: none noted DESCRIPTION OF SALIVA: decreased saliva and incipient xerostomia ANY EXPOSED BONE: none noted OTHER WATCHED AREAS:  Retained root segments in the upper left and lower left quadrants. DX: Xerostomia, Weight Loss and retained root segments, dental caries, and chronic apical periodontitis  PROCEDURE: Appliances were tried in and adjusted as needed. [place] Postop instructions were provided and a written and verbal format concerning the use and care of appliances. All questions were answered.  RECOMMENDATIONS: 1. Brush after meals and at bedtime.  Use fluoride in the fluoride trays at bedtime. 2. Use trismus exercises as directed. 3. Use Biotene Rinse or salt water/baking soda rinses. 4. Multiple sips of water  as needed. 5. Return to clinic in two months for oral exam after radiation therapy. Call if problems before then.  Lenn Cal, DDS

## 2018-03-21 NOTE — Patient Instructions (Addendum)
FLUORIDE TRAYS PATIENT INSTRUCTIONS    Obtain Prevident 5000 prescription from the pharmacy.  Don't be surprised if it needs to be ordered.  Be sure to let the pharmacy know when you are close to needing a new refill for them to have it ready for you without interruption of Fluoride use.  The best time to use your Fluoride is before bedtime.  You must brush your teeth very well and floss before using the Fluoride in order to get the best use out of the Fluoride treatments.  Place Fluoride gel in the tray and spread gel around in the tray with your finger or cotton tip applicator.  Place the tray on your lower teeth and your upper teeth.  Make sure the trays are seated all the way.  Remember, they only fit one way on your teeth.  Insert for 5 full minutes.  At the end of the 5 minutes, take the trays out.  SPIT OUT excess.   Do NOT rinse your mouth!  Do NOT eat or drink after treatments for at least 30 minutes.  This is why the best time for your treatments is before bedtime.  Clean the inside of your Fluoride trays using COLD WATER and a toothbrush.  In order to keep your Trays from discoloring and free from odors, soak them overnight in denture cleaners such as Efferdent.  Do not use bleach or non denture products.  Store the trays in a safe dry place AWAY from any heat until your next treatment.  If anything happens to your Fluoride trays, or they don't fit as well after any dental work, please let us know as soon as possible.   RECOMMENDATIONS: 1. Brush after meals and at bedtime.  Use fluoride in the fluoride trays at bedtime. 2. Use trismus exercises as directed. 3. Use Biotene Rinse or salt water/baking soda rinses. 4. Multiple sips of water as needed. 5. Return to clinic in two months for oral exam after radiation therapy. Call if problems before then.  Lenn Cal, DDS

## 2018-03-22 ENCOUNTER — Ambulatory Visit
Admission: RE | Admit: 2018-03-22 | Discharge: 2018-03-22 | Disposition: A | Discharge status: Home / Self Care | Payer: Medicare Other | Source: Ambulatory Visit | Attending: Radiation Oncology | Admitting: Radiation Oncology

## 2018-03-22 DIAGNOSIS — Z510 Encounter for antineoplastic radiation therapy: Secondary | ICD-10-CM | POA: Diagnosis not present

## 2018-03-23 ENCOUNTER — Ambulatory Visit
Admission: RE | Admit: 2018-03-23 | Discharge: 2018-03-23 | Disposition: A | Discharge status: Home / Self Care | Payer: Medicare Other | Source: Ambulatory Visit | Attending: Radiation Oncology | Admitting: Radiation Oncology

## 2018-03-23 DIAGNOSIS — Z510 Encounter for antineoplastic radiation therapy: Secondary | ICD-10-CM | POA: Diagnosis not present

## 2018-03-24 ENCOUNTER — Ambulatory Visit
Admission: RE | Admit: 2018-03-24 | Discharge: 2018-03-24 | Disposition: A | Discharge status: Home / Self Care | Payer: Medicare Other | Source: Ambulatory Visit | Attending: Radiation Oncology | Admitting: Radiation Oncology

## 2018-03-24 DIAGNOSIS — Z510 Encounter for antineoplastic radiation therapy: Secondary | ICD-10-CM | POA: Diagnosis not present

## 2018-03-25 ENCOUNTER — Ambulatory Visit
Admission: RE | Admit: 2018-03-25 | Discharge: 2018-03-25 | Disposition: A | Discharge status: Home / Self Care | Payer: Medicare Other | Source: Ambulatory Visit | Attending: Radiation Oncology | Admitting: Radiation Oncology

## 2018-03-25 DIAGNOSIS — Z510 Encounter for antineoplastic radiation therapy: Secondary | ICD-10-CM | POA: Diagnosis not present

## 2018-03-28 ENCOUNTER — Other Ambulatory Visit: Payer: Self-pay | Admitting: Radiation Oncology

## 2018-03-28 ENCOUNTER — Ambulatory Visit
Admission: RE | Admit: 2018-03-28 | Discharge: 2018-03-28 | Disposition: A | Discharge status: Home / Self Care | Payer: Medicare Other | Source: Ambulatory Visit | Attending: Radiation Oncology | Admitting: Radiation Oncology

## 2018-03-28 DIAGNOSIS — Z510 Encounter for antineoplastic radiation therapy: Secondary | ICD-10-CM | POA: Diagnosis not present

## 2018-03-28 DIAGNOSIS — C4440 Unspecified malignant neoplasm of skin of scalp and neck: Secondary | ICD-10-CM

## 2018-03-28 MED ORDER — LIDOCAINE VISCOUS HCL 2 % MT SOLN: OROMUCOSAL | 5 refills | Status: DC

## 2018-03-29 ENCOUNTER — Ambulatory Visit
Admission: RE | Admit: 2018-03-29 | Discharge: 2018-03-29 | Disposition: A | Discharge status: Home / Self Care | Payer: Medicare Other | Source: Ambulatory Visit | Attending: Radiation Oncology | Admitting: Radiation Oncology

## 2018-03-29 DIAGNOSIS — Z510 Encounter for antineoplastic radiation therapy: Secondary | ICD-10-CM | POA: Diagnosis not present

## 2018-03-30 ENCOUNTER — Ambulatory Visit
Admission: RE | Admit: 2018-03-30 | Discharge: 2018-03-30 | Disposition: A | Discharge status: Home / Self Care | Payer: Medicare Other | Source: Ambulatory Visit | Attending: Radiation Oncology | Admitting: Radiation Oncology

## 2018-03-30 DIAGNOSIS — Z510 Encounter for antineoplastic radiation therapy: Secondary | ICD-10-CM | POA: Diagnosis not present

## 2018-03-31 ENCOUNTER — Ambulatory Visit
Admission: RE | Admit: 2018-03-31 | Discharge: 2018-03-31 | Disposition: A | Discharge status: Home / Self Care | Payer: Medicare Other | Source: Ambulatory Visit | Attending: Radiation Oncology | Admitting: Radiation Oncology

## 2018-03-31 DIAGNOSIS — Z510 Encounter for antineoplastic radiation therapy: Secondary | ICD-10-CM | POA: Diagnosis not present

## 2018-04-01 ENCOUNTER — Ambulatory Visit
Admission: RE | Admit: 2018-04-01 | Discharge: 2018-04-01 | Disposition: A | Discharge status: Home / Self Care | Payer: Medicare Other | Source: Ambulatory Visit | Attending: Radiation Oncology | Admitting: Radiation Oncology

## 2018-04-01 DIAGNOSIS — Z510 Encounter for antineoplastic radiation therapy: Secondary | ICD-10-CM | POA: Diagnosis not present

## 2018-04-04 ENCOUNTER — Ambulatory Visit
Admission: RE | Admit: 2018-04-04 | Discharge: 2018-04-04 | Disposition: A | Discharge status: Home / Self Care | Payer: Medicare Other | Source: Ambulatory Visit | Attending: Radiation Oncology | Admitting: Radiation Oncology

## 2018-04-04 DIAGNOSIS — Z510 Encounter for antineoplastic radiation therapy: Secondary | ICD-10-CM | POA: Diagnosis not present

## 2018-04-04 DIAGNOSIS — C4440 Unspecified malignant neoplasm of skin of scalp and neck: Secondary | ICD-10-CM | POA: Diagnosis present

## 2018-04-05 ENCOUNTER — Ambulatory Visit
Admission: RE | Admit: 2018-04-05 | Discharge: 2018-04-05 | Disposition: A | Discharge status: Home / Self Care | Payer: Medicare Other | Source: Ambulatory Visit | Attending: Radiation Oncology | Admitting: Radiation Oncology

## 2018-04-05 DIAGNOSIS — Z510 Encounter for antineoplastic radiation therapy: Secondary | ICD-10-CM | POA: Diagnosis not present

## 2018-04-05 DIAGNOSIS — C4440 Unspecified malignant neoplasm of skin of scalp and neck: Secondary | ICD-10-CM

## 2018-04-05 MED ORDER — SILVER SULFADIAZINE 1 % EX CREA
TOPICAL_CREAM | Freq: Two times a day (BID) | CUTANEOUS | Status: DC
  Administered 2018-04-05: 16:00:00 via TOPICAL

## 2018-04-06 ENCOUNTER — Ambulatory Visit
Admission: RE | Admit: 2018-04-06 | Discharge: 2018-04-06 | Disposition: A | Discharge status: Home / Self Care | Payer: Medicare Other | Source: Ambulatory Visit | Attending: Radiation Oncology | Admitting: Radiation Oncology

## 2018-04-06 DIAGNOSIS — Z510 Encounter for antineoplastic radiation therapy: Secondary | ICD-10-CM | POA: Diagnosis not present

## 2018-04-07 ENCOUNTER — Ambulatory Visit
Admission: RE | Admit: 2018-04-07 | Discharge: 2018-04-07 | Disposition: A | Discharge status: Home / Self Care | Payer: Medicare Other | Source: Ambulatory Visit | Attending: Radiation Oncology | Admitting: Radiation Oncology

## 2018-04-07 DIAGNOSIS — Z510 Encounter for antineoplastic radiation therapy: Secondary | ICD-10-CM | POA: Diagnosis not present

## 2018-04-08 ENCOUNTER — Ambulatory Visit
Admission: RE | Admit: 2018-04-08 | Discharge: 2018-04-08 | Disposition: A | Discharge status: Home / Self Care | Payer: Medicare Other | Source: Ambulatory Visit | Attending: Radiation Oncology | Admitting: Radiation Oncology

## 2018-04-08 DIAGNOSIS — Z510 Encounter for antineoplastic radiation therapy: Secondary | ICD-10-CM | POA: Diagnosis not present

## 2018-04-11 ENCOUNTER — Ambulatory Visit
Admission: RE | Admit: 2018-04-11 | Discharge: 2018-04-11 | Disposition: A | Discharge status: Home / Self Care | Payer: Medicare Other | Source: Ambulatory Visit | Attending: Radiation Oncology | Admitting: Radiation Oncology

## 2018-04-11 DIAGNOSIS — Z510 Encounter for antineoplastic radiation therapy: Secondary | ICD-10-CM | POA: Diagnosis not present

## 2018-04-11 DIAGNOSIS — C4440 Unspecified malignant neoplasm of skin of scalp and neck: Secondary | ICD-10-CM

## 2018-04-11 MED ORDER — SONAFINE EX EMUL
1.0000 "application " | Freq: Once | CUTANEOUS | Status: AC
  Administered 2018-04-11: 1 via TOPICAL

## 2018-04-12 ENCOUNTER — Ambulatory Visit
Admission: RE | Admit: 2018-04-12 | Discharge: 2018-04-12 | Disposition: A | Discharge status: Home / Self Care | Payer: Medicare Other | Source: Ambulatory Visit | Attending: Radiation Oncology | Admitting: Radiation Oncology

## 2018-04-12 DIAGNOSIS — Z510 Encounter for antineoplastic radiation therapy: Secondary | ICD-10-CM | POA: Diagnosis not present

## 2018-04-13 ENCOUNTER — Ambulatory Visit: Payer: Medicare Other

## 2018-04-13 ENCOUNTER — Ambulatory Visit
Admission: RE | Admit: 2018-04-13 | Discharge: 2018-04-13 | Disposition: A | Discharge status: Home / Self Care | Payer: Medicare Other | Source: Ambulatory Visit | Attending: Radiation Oncology | Admitting: Radiation Oncology

## 2018-04-13 ENCOUNTER — Encounter: Payer: Self-pay | Admitting: Radiation Oncology

## 2018-04-13 DIAGNOSIS — Z510 Encounter for antineoplastic radiation therapy: Secondary | ICD-10-CM | POA: Diagnosis not present

## 2018-04-14 ENCOUNTER — Ambulatory Visit: Payer: Medicare Other

## 2018-04-15 ENCOUNTER — Ambulatory Visit: Payer: Medicare Other

## 2018-04-18 ENCOUNTER — Ambulatory Visit: Payer: Medicare Other

## 2018-04-19 NOTE — Progress Notes (Signed)
  Radiation Oncology         [phone]) [phone] ________________________________  Name: FIONN STRACKE MRN: [id number]  Date: 04/13/2018  DOB: June 01, 1932  End of Treatment Note  Diagnosis:   85 y.o. male with Skin cancer of scalp or skin of neck Staging form: Cutaneous Carcinoma of the Head and Neck, AJCC 8th Edition - Clinical stage from 02/16/2018: Stage III (cT3, cN0, cM0) - Signed by Eppie Gibson, MD on 02/16/2018     Indication for treatment:  Curative       Radiation treatment dates:   03/03/2018 - 04/13/2018  Site/dose:   Right Neck / 60 Gy in 30 fractions  Beams/energy:   IMRT / 6X Photon  Narrative: The patient tolerated radiation treatment relatively well.   He experienced moderate fatigue and some skin irritation with erythema and moist desquamation to the radiation site. He is applying Silvadene and Sonafine to this area. He denied any difficulty or pain with swallowing.  Plan: The patient has completed radiation treatment. The patient will return to radiation oncology clinic for routine followup in one half month. I advised them to call or return sooner if they have any questions or concerns related to their recovery or treatment.  -----------------------------------  Eppie Gibson, MD  This document serves as a record of services personally performed by Eppie Gibson, MD. It was created on her behalf by Rae Lips, a trained medical scribe. The creation of this record is based on the scribe's personal observations and the provider's statements to them. This document has been checked and approved by the attending provider.

## 2018-05-11 ENCOUNTER — Other Ambulatory Visit: Payer: Self-pay | Admitting: Radiation Oncology

## 2018-05-11 DIAGNOSIS — C4440 Unspecified malignant neoplasm of skin of scalp and neck: Secondary | ICD-10-CM

## 2018-05-11 NOTE — Progress Notes (Signed)
I called the patient today about their upcoming follow-up appointment in radiation oncology.   Given concerns about the COVID-19 outbreaks across the [place], I offered a phone assessment with the patient to determine if coming to the clinic was necessary. The patient accepted.  I let the patient know that I had spoken with Dr. Isidore Moos, and she wanted them to know the importance of washing their hands for at least 20 seconds at a time, especially after going out in public, and before they eat. Do not touch your face, unless your hands are clean, such as when bathing. Get plenty of rest, eat well, and stay hydrated.   Symptomatically, the patient is doing relatively well. He reports he is feeling and eating well. He reports dry skin to his radiation site. I encouraged him to apply sonafine twice daily and when that is completed to begin using a vitamin E containing lotion for 2 more months.   All questions were answered to the patient's satisfaction.  I encouraged the patient to call with any further questions. Otherwise, the plan is a CT scan in approximately 2 months and follow up with Dr. Isidore Moos for results.    Patient is pleased with this plan, and we will cancel their upcoming follow-up to reduce the risk of COVID-19 transmission.

## 2018-05-13 ENCOUNTER — Ambulatory Visit
Admission: RE | Admit: 2018-05-13 | Discharge: 2018-05-13 | Disposition: A | Discharge status: Home / Self Care | Payer: Medicare Other | Source: Ambulatory Visit | Attending: Radiation Oncology | Admitting: Radiation Oncology

## 2018-05-16 ENCOUNTER — Telehealth (HOSPITAL_COMMUNITY): Payer: Self-pay | Admitting: Dentistry

## 2018-05-16 NOTE — Telephone Encounter (Signed)
05/16/2018  Patient:            [name] Date of Birth:  [birth year] MRN:                [id number]   [name] was contacted by phone to confirm dental appointment for status post radiation therapy oral examination scheduled for 05/17/2018.  Patient indicated that he did not want to follow-up with Dental Medicine at this time and wanted to cancel the appointment.  Patient was then instructed that he needed to follow-up with a dentist of his choice to evaluate unmet dental needs.  Patient is to contact Dental medicine for release of information to a new dentist as indicated.   Lenn Cal, DDS

## 2018-05-17 ENCOUNTER — Other Ambulatory Visit (HOSPITAL_COMMUNITY): Payer: Self-pay | Admitting: Dentistry

## 2018-05-23 ENCOUNTER — Other Ambulatory Visit: Payer: Self-pay

## 2018-05-23 ENCOUNTER — Ambulatory Visit: Payer: Medicare Other | Admitting: Neurology

## 2018-05-23 ENCOUNTER — Encounter: Payer: Self-pay | Admitting: Neurology

## 2018-05-23 ENCOUNTER — Telehealth: Payer: Self-pay

## 2018-05-23 VITALS — BP 127/77 | HR 96 | Ht 70.0 in | Wt 223.3 lb

## 2018-05-23 DIAGNOSIS — R269 Unspecified abnormalities of gait and mobility: Secondary | ICD-10-CM | POA: Diagnosis not present

## 2018-05-23 DIAGNOSIS — H814 Vertigo of central origin: Secondary | ICD-10-CM | POA: Diagnosis not present

## 2018-05-23 DIAGNOSIS — R42 Dizziness and giddiness: Secondary | ICD-10-CM | POA: Diagnosis not present

## 2018-05-23 DIAGNOSIS — E538 Deficiency of other specified B group vitamins: Secondary | ICD-10-CM | POA: Diagnosis not present

## 2018-05-23 HISTORY — DX: Unspecified abnormalities of gait and mobility: R26.9

## 2018-05-23 MED ORDER — ALPRAZOLAM 0.5 MG PO TABS: ORAL_TABLET | ORAL | 0 refills | Status: DC

## 2018-05-23 NOTE — Progress Notes (Signed)
Reason for visit: gait disorder, dizziness  Referring physician: Dr. Rella Larve is a 85 y.o. male  History of present illness:  [name] is an 85 year old right-handed white male with a history of brief episodes of vertigo 20 years ago, he noted this when he got up out of bed in the morning, he may have had a second episode within a week of the first episode.  He has had a sensation of being "discombobulated" over the last 35 years with intermittent episodes that may last up to several minutes and then clear briefly, occurring multiple times during the day.  As time has gone on, the episodes are becoming more frequent and more severe.  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  He has some numbness around the surgical site but otherwise denies any numbness of the extremities.  He has noted the episodes of feeling woozy may occur with sitting, standing, or lying down.  He can bring on episodes by standing and then closing his eyes, he feels very dizzy and woozy when this happens, he feels better when his eyes are open.  When he walks, he has noted that his upper body may get ahead of his feet and he has difficulty stopping.  The patient has not actually fallen down.  He denies any neck pain or low back pain or pain down the arms or legs.  He does have some urinary urgency without incontinence, he denies issues with controlling the bowels.  He has some weakness with elevation of the right arm since the neck surgery.  He denies any double vision, he may occasionally have some blurring of vision.  He has been told in the past that there is a difference between the blood pressures one arm to the next.  He is sent to this office for further evaluation.  Past Medical History:  Diagnosis Date  . Back pain   . DVT (deep venous thrombosis) ([place])   . Glaucoma   . Hypertension   . Insomnia   .  Pulmonary embolism, bilateral [facility]) May 2012   on coumadin  . Skin cancer    forehead  . Skin cancer   . Vertigo     Past Surgical History:  Procedure Laterality Date  . BACK SURGERY     x3  . EXCISION MASS NECK Right 01/19/2018   Procedure: Wide local excision of right neck skin cancer (5 cm);  Surgeon: Melida Quitter, MD;  Location: [place];  Service: ENT;  Laterality: Right;  . RADICAL NECK DISSECTION Right 01/19/2018    Right modified radical neck dissection; Cervicopectoral flap (Right Neck)  . RADICAL NECK DISSECTION Right 01/19/2018   Procedure: Right modified radical neck dissection; Cervicopectoral flap;  Surgeon: Melida Quitter, MD;  Location: [place];  Service: ENT;  Laterality: Right;  . SHOULDER SURGERY     x3  Dr. Karrie Doffing al.  . TOTAL HIP ARTHROPLASTY  2012   Dr. Alvan Dame right hip  . TRANSTHORACIC ECHOCARDIOGRAM  07/26/2010   Left ventricle: The cavity size was normal. Wall thickness was increased in a pattern of moderate LVH. Systolic function was normal. The estimated ejection fraction was in the range of 55% to 60%    Family History  Problem Relation Age of Onset  . Stroke Father   . Alzheimer's disease Mother   . Lung cancer Brother  Social history:  reports that he quit smoking about 37 years ago. His smoking use included cigarettes. He has a 25.00 pack-year smoking history. He quit smokeless tobacco use about 10 years ago.  His smokeless tobacco use included chew. He reports current alcohol use. He reports that he does not use drugs.  Medications:  Prior to Admission medications   Medication Sig Start Date End Date Taking? Authorizing Provider  Ascorbic Acid (VITAMIN C) 1000 MG tablet Take 1,000 mg by mouth daily.      [provider]  aspirin 81 MG tablet Take 81 mg by mouth daily.     [provider]  cholecalciferol (VITAMIN D3) 25 MCG (1000 UT) tablet Take 1,000 Units by mouth daily.     [provider]  diphenhydrAMINE (BENADRYL)  50 MG capsule Take 50 mg by mouth daily.    [provider]  GLUCOSAMINE-CHONDROITIN PO Take 1 tablet by mouth daily.    [provider]  L-Arginine 1000 MG TABS Take 1,000 mg by mouth daily.    [provider]  latanoprost (XALATAN) 0.005 % ophthalmic solution Place 1 drop into both eyes at bedtime.  09/30/13   [provider]  lidocaine (XYLOCAINE) 2 % solution Patient: Mix 1part 2% viscous lidocaine, 1part H20. Swallow 109mL of diluted mixture, 23min before meals and at bedtime, up to QID 03/28/18   Eppie Gibson, MD  losartan (COZAAR) 100 MG tablet Take 1 tablet (100 mg total) by mouth daily. NEED OV. 10/05/16   Croitoru, Mihai, MD  meclizine (ANTIVERT) 25 MG tablet Take 25 mg by mouth every 6 (six) hours as needed for dizziness.     [provider]  mirtazapine (REMERON) 30 MG tablet Take 30 mg by mouth at bedtime. 01/13/15   [provider]  Red Yeast Rice 600 MG CAPS Take 1,200 mg by mouth daily.     [provider]  Saw Palmetto, Serenoa repens, (SAW PALMETTO PO) Take 1,800 mg by mouth daily.    [provider]  sodium fluoride (PREVIDENT 5000 PLUS) 1.1 % CREA dental cream Apply to tooth brush. Brush teeth for 2 minutes. Spit out excess. DO NOT rinse afterwards. Repeat nightly. 02/14/18   Lenn Cal, DDS  vitamin E 400 UNIT capsule Take 400 Units by mouth daily.     [provider]      Allergies  Allergen Reactions  . Codeine Nausea And Vomiting  . Morphine And Related Nausea And Vomiting    ROS:  Out of a complete 14 system review of symptoms, the patient complains only of the following symptoms, and all other reviewed systems are negative.  Weight loss Blurred vision Snoring Dizziness Runny nose Not enough sleep Insomnia  Blood pressure 127/77, pulse 96, height 5\' 10"  (1.778 m), weight 223 lb 5 oz (101.3 kg).   Blood pressure, right arm, standing is 120/86.  Blood pressure, right arm,  sitting is 116/80.  Blood pressure, sitting in the left arm is 962 systolic.  The radial pulse appears to be more prominent in the right wrist than the left.  Physical Exam  General: The patient is alert and cooperative at the time of the examination.  The patient is moderately obese.  Eyes: Pupils are equal, round, and reactive to light. Discs are flat bilaterally.  Neck: The neck is supple, no carotid bruits are noted.  Respiratory: The respiratory examination is clear.  Cardiovascular: The cardiovascular examination reveals a regular rate and rhythm, no obvious murmurs  or rubs are noted.  Skin: Extremities are without significant edema.  Neurologic Exam  Mental status: The patient is alert and oriented x 3 at the time of the examination. The patient has apparent normal recent and remote memory, with an apparently normal attention span and concentration ability.  Cranial nerves: Facial symmetry is present. There is good sensation of the face to pinprick and soft touch bilaterally. The strength of the facial muscles and the muscles to head turning and shoulder shrug are normal bilaterally. Speech is well enunciated, no aphasia or dysarthria is noted. Extraocular movements are full. Visual fields are full. The tongue is midline, and the patient has symmetric elevation of the soft palate. No obvious hearing deficits are noted.  Motor: The motor testing reveals 5 over 5 strength of all 4 extremities. Good symmetric motor tone is noted throughout.  Sensory: Sensory testing is intact to pinprick, soft touch, vibration sensation, and position sense on the upper extremities.  With the lower extremities, no definite stocking pattern pinprick sensory deficit is noted.  Vibration sensation is depressed on the left foot as compared to the right and position sensation is decreased on the right foot as compared to the left.  No evidence of extinction is noted.  Coordination: Cerebellar testing  reveals good finger-nose-finger and heel-to-shin bilaterally.  Gait and station: Gait is slightly wide-based, the patient can walk independently.  Tandem gait is unsteady.  Romberg is negative but is unsteady.  Reflexes: Deep tendon reflexes are symmetric, but are slightly depressed bilaterally. Toes are downgoing bilaterally.   Assessment/Plan:  1.  Reports of lightheaded sensations  2.  Gait instability  The patient has reported a 3 to 5-year history of worsening sensations that may come and go, but are clearly worse when he is standing and he closes his eyes.  The patient will be sent for blood work today, he will have MRI of the brain.  Given the blood pressure difference from one arm to the next we will check a carotid Doppler study.  If the above studies are unrevealing, we may consider EMG nerve conduction study evaluation to exclude a peripheral neuropathy.  He otherwise will follow-up in 4 months.    Jill Alexanders MD 05/23/2018 2:23 PM  [facility] [address] [place], [place] [address]  Phone [phone] Fax [phone]

## 2018-05-23 NOTE — Telephone Encounter (Signed)
Attempted to reach the pt and advise our office would be closed on 05/23/18 to pt's. 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 Will try again at a later time.

## 2018-05-25 ENCOUNTER — Telehealth: Payer: Self-pay | Admitting: Neurology

## 2018-05-25 LAB — RPR, QUANT+TP ABS (REFLEX)
Rapid Plasma Reagin, Quant: 1:1 {titer} — ABNORMAL HIGH
TREPONEMA PALLIDUM AB: NONREACTIVE

## 2018-05-25 LAB — RPR: RPR Ser Ql: REACTIVE — AB

## 2018-05-25 LAB — VITAMIN B12: Vitamin B-12: 269 pg/mL (ref 232–1245)

## 2018-05-25 LAB — COPPER, SERUM: COPPER: 43 ug/dL — AB (ref 72–166)

## 2018-05-25 LAB — SEDIMENTATION RATE: SED RATE: 10 mm/h (ref 0–30)

## 2018-05-25 NOTE — Telephone Encounter (Signed)
I called the patient.  The blood work reveals a low normal vitamin B12 level, the patient will go on supplementation 1000 mcg daily.  The copper level was low, he is to go and get a multivitamin that has a least 2 mg of copper and start taking the multivitamin daily.  The patient does have a positive RPR, but the treponemal antibodies were negative, this is a false positive.  When MRI of the brain is done, if there is evidence of significant small vessel disease, we may need to check for lupus anticoagulant antibodies.

## 2018-05-25 NOTE — Telephone Encounter (Signed)
[employer] Medicare order sent to GI. No auth they will reach out to the pt to schedule.  °

## 2018-06-01 ENCOUNTER — Other Ambulatory Visit: Payer: Self-pay

## 2018-06-01 ENCOUNTER — Ambulatory Visit
Admission: RE | Admit: 2018-06-01 | Discharge: 2018-06-01 | Disposition: A | Discharge status: Home / Self Care | Payer: Medicare Other | Source: Ambulatory Visit | Attending: Neurology | Admitting: Neurology

## 2018-06-01 DIAGNOSIS — R42 Dizziness and giddiness: Secondary | ICD-10-CM

## 2018-06-01 DIAGNOSIS — H814 Vertigo of central origin: Secondary | ICD-10-CM

## 2018-06-02 ENCOUNTER — Telehealth: Payer: Self-pay | Admitting: Neurology

## 2018-06-02 NOTE — Telephone Encounter (Signed)
I called the patient.  MRI of the brain shows significant cortical atrophy and hydrocephalus ex vacuo but no evidence of significant cerebrovascular disease that would cause dizziness.  The patient will have a carotid Doppler study in April.  I will call him with results.   MRI brain 06/02/18:  IMPRESSION:   MRI brain (without) demonstrating: - Moderate to severe atrophy.  Moderate to severe ventriculomegaly on ex vacuo basis. - Right thalamic chronic lacunar ischemic infarct. - Left parietal chronic cerebral microhemorrhage. - No acute findings.

## 2018-06-27 ENCOUNTER — Telehealth: Payer: Self-pay | Admitting: Neurology

## 2018-06-27 ENCOUNTER — Other Ambulatory Visit: Payer: Self-pay

## 2018-06-27 ENCOUNTER — Ambulatory Visit (HOSPITAL_COMMUNITY)
Admission: RE | Admit: 2018-06-27 | Discharge: 2018-06-27 | Disposition: A | Discharge status: Home / Self Care | Payer: Medicare Other | Source: Ambulatory Visit | Attending: Neurology | Admitting: Neurology

## 2018-06-27 DIAGNOSIS — R269 Unspecified abnormalities of gait and mobility: Secondary | ICD-10-CM

## 2018-06-27 DIAGNOSIS — R42 Dizziness and giddiness: Secondary | ICD-10-CM | POA: Insufficient documentation

## 2018-06-27 DIAGNOSIS — H814 Vertigo of central origin: Secondary | ICD-10-CM

## 2018-06-27 NOTE — Telephone Encounter (Signed)
I called the patient.  The carotid Doppler study was unremarkable.  I will go ahead and get him set up for nerve conduction studies on the legs to see if a neuropathy is present.  He does report increase sensation of being "discombobulated" when he closes eyes, he also will start walking and then increases his walking speed, may have trouble stopping himself.  Carotid Doppler 06/27/18:  Summary: Right Carotid: There is no evidence of stenosis in the right ICA.  Left Carotid: There is no evidence of stenosis in the left ICA.  Vertebrals: Bilateral vertebral arteries demonstrate antegrade flow.

## 2018-07-13 ENCOUNTER — Telehealth: Payer: Self-pay | Admitting: *Deleted

## 2018-07-13 NOTE — Telephone Encounter (Signed)
CALLED PATIENT TO INFORM OF STAT LAB ON 07-18-18 @ 1:45 PM @ [place] AND HIS CT FOR 07-18-18- ARRIVAL TIME- 2:45 PM, PT. TO BE NPO- 4 HRS. PRIOR TO TEST, PATIENT TO GET RESULTS FROM DR. SQUIRE ON 07-19-18 @ 2:20 PM, LVM FOR A RETURN CALL

## 2018-07-18 ENCOUNTER — Encounter (HOSPITAL_COMMUNITY): Payer: Self-pay

## 2018-07-18 ENCOUNTER — Other Ambulatory Visit: Payer: Self-pay

## 2018-07-18 ENCOUNTER — Ambulatory Visit (HOSPITAL_COMMUNITY)
Admission: RE | Admit: 2018-07-18 | Discharge: 2018-07-18 | Disposition: A | Discharge status: Home / Self Care | Payer: Medicare Other | Source: Ambulatory Visit | Attending: Radiation Oncology | Admitting: Radiation Oncology

## 2018-07-18 ENCOUNTER — Ambulatory Visit
Admission: RE | Admit: 2018-07-18 | Discharge: 2018-07-18 | Disposition: A | Discharge status: Home / Self Care | Payer: Medicare Other | Source: Ambulatory Visit | Attending: Radiation Oncology | Admitting: Radiation Oncology

## 2018-07-18 DIAGNOSIS — C4440 Unspecified malignant neoplasm of skin of scalp and neck: Secondary | ICD-10-CM | POA: Insufficient documentation

## 2018-07-18 LAB — BUN & CREATININE (CHCC)
BUN: 12 mg/dL (ref 8–23)
Creatinine: 0.95 mg/dL (ref 0.61–1.24)
GFR, Est AFR Am: >60 mL/min (ref >60)
GFR, Estimated: >60 mL/min (ref >60)

## 2018-07-18 MED ORDER — SODIUM CHLORIDE (PF) 0.9 % IJ SOLN
INTRAMUSCULAR | Status: AC
  Filled 2018-07-18: qty 50

## 2018-07-18 MED ORDER — IOHEXOL 300 MG/ML  SOLN
75.0000 mL | Freq: Once | INTRAMUSCULAR | Status: AC | PRN
  Administered 2018-07-18: 15:00:00 75 mL via INTRAVENOUS

## 2018-07-19 ENCOUNTER — Ambulatory Visit
Admission: RE | Admit: 2018-07-19 | Discharge: 2018-07-19 | Disposition: A | Discharge status: Home / Self Care | Payer: Medicare Other | Source: Ambulatory Visit | Attending: Radiation Oncology | Admitting: Radiation Oncology

## 2018-07-19 DIAGNOSIS — C4440 Unspecified malignant neoplasm of skin of scalp and neck: Secondary | ICD-10-CM

## 2018-07-19 NOTE — Progress Notes (Signed)
Radiation Oncology         [phone] ________________________________  Name: [name] MRN: [id number]  Date: 07/19/2018  DOB: 1932-09-03  Follow-Up Telephone Note  Outpatient  CC: Leonard Downing, MD  Melida Quitter, MD  Diagnosis and Prior Radiotherapy:    ICD-10-CM   1. Skin cancer of scalp or skin of neck C44.40     Cancer Staging Skin cancer of scalp or skin of neck Staging form: Cutaneous Carcinoma of the Head and Neck, AJCC 8th Edition - Clinical stage from 02/16/2018: Stage III (cT3, cN0, cM0) - Signed by Eppie Gibson, MD on 02/16/2018  Radiation treatment dates:   03/03/2018 - 04/13/2018 Site/dose:   Right Neck / 60 Gy in 30 fractions  CHIEF COMPLAINT:   skin cancer  Narrative:  The patient returns today for routine follow-up.  Follow-up CT scan of the neck performed yesterday, 07/18/2018, shows significant involution of the treated invasive skin cancer. Equivocal soft tissue fulness in the right carotid space; focally prominent right sternocleidomastoid; difficult to exclude residual tumor, given the muscle infiltration present at the time of diagnosis. I've reviewed the images   He denies any lumps, bumps, ulcers, scabs, lesions in treatment/surgical fields. He has no complaints                             ALLERGIES:  is allergic to codeine and morphine and related.  Meds: Current Outpatient Medications  Medication Sig Dispense Refill  . ALPRAZolam (XANAX) 0.5 MG tablet Take 2 tablets approximately 45 minutes prior to the MRI study, take a third tablet if needed. 3 tablet 0  . Ascorbic Acid (VITAMIN C) 1000 MG tablet Take 1,000 mg by mouth daily.      Marland Kitchen aspirin 81 MG tablet Take 81 mg by mouth daily.     . cholecalciferol (VITAMIN D3) 25 MCG (1000 UT) tablet Take 1,000 Units by mouth daily.     . diphenhydrAMINE (BENADRYL) 50 MG capsule Take 50 mg by mouth daily.    Marland Kitchen GLUCOSAMINE-CHONDROITIN PO Take 1 tablet by mouth daily.    Marland Kitchen L-Arginine 1000 MG  TABS Take 1,000 mg by mouth daily.    Marland Kitchen losartan (COZAAR) 100 MG tablet Take 1 tablet (100 mg total) by mouth daily. NEED OV. 30 tablet 0  . meclizine (ANTIVERT) 25 MG tablet Take 25 mg by mouth every 6 (six) hours as needed for dizziness.     . mirtazapine (REMERON) 30 MG tablet Take 30 mg by mouth at bedtime.    . Red Yeast Rice 600 MG CAPS Take 1,200 mg by mouth daily.     . Saw Palmetto, Serenoa repens, (SAW PALMETTO PO) Take 1,800 mg by mouth daily.    . sodium fluoride (PREVIDENT 5000 PLUS) 1.1 % CREA dental cream Apply to tooth brush. Brush teeth for 2 minutes. Spit out excess. DO NOT rinse afterwards. Repeat nightly. 1 Tube prn  . vitamin E 400 UNIT capsule Take 400 Units by mouth daily.      No current facility-administered medications for this encounter.     Physical Findings: The patient is in no acute distress. Patient is alert and oriented.  vitals were not taken for this visit. .      Lab Findings: Lab Results  Component Value Date   WBC 6.9 01/14/2018   HGB 13.9 01/19/2018   HCT 41.0 01/19/2018   MCV 99.1 01/14/2018   PLT 303 01/14/2018  Radiographic Findings: Ct Soft Tissue Neck W Contrast  Result Date: 07/19/2018 CLINICAL DATA:  85 y.o. male with Skin cancer of scalp or skin of neck: Stage III (cT3, cN0, cM0) Radiation treatment dates: 03/03/2018 - 02/12/2020Site/dose: Right Neck / 60 Gy in 30 fractions EXAM: CT NECK WITH CONTRAST TECHNIQUE: Multidetector CT imaging of the neck was performed using the standard protocol following the bolus administration of intravenous contrast. CONTRAST:  79mL OMNIPAQUE IOHEXOL 300 MG/ML  SOLN COMPARISON:  CT neck 12/30/2017. FINDINGS: Pharynx and larynx: Normal. No mass or swelling. Salivary glands: No inflammation, mass, or stone. Thyroid: Normal. Lymph nodes: None enlarged or abnormal density. Vascular: Patent. Equivocal soft tissue fullness of the RIGHT carotid space near the bifurcation; see series 3, image 53, arrow. Just  lateral to this, the RIGHT sternocleidomastoid appears focally prominent, and it is difficult to exclude residual tumor, given the muscle infiltration present at the time of diagnosis (series 3, image 54 of the prior study). Limited intracranial: Negative Visualized orbits: Negative Mastoids and visualized paranasal sinuses: Clear. Skeleton: No acute or aggressive process. Spondylosis. Poor dentition. Upper chest: Unremarkable. Other: Stranding of the superficial soft tissues, representing regression of the treated tumor, is significantly improved from priors. IMPRESSION: Significant involution of the treated invasive skin cancer as described above. Equivocal soft tissue fullness in the RIGHT carotid space; focally prominent RIGHT sternocleidomastoid; difficult to exclude residual tumor, given the muscle infiltration present at the time of diagnosis. Three month follow-up scan, CT neck with contrast recommended. Electronically Signed   By: Staci Righter M.D.   On: 07/19/2018 09:39   Vas US Carotid  Result Date: 06/27/2018 Carotid Arterial Duplex Study Indications:   Dizziness. Other Factors: History of right modified radical neck dissection surgery on                01/19/18 for skin cancer. Performing Technologist: Oda Cogan RDMS, RVT  Examination Guidelines: A complete evaluation includes B-mode imaging, spectral Doppler, color Doppler, and power Doppler as needed of all accessible portions of each vessel. Bilateral testing is considered an integral part of a complete examination. Limited examinations for reoccurring indications may be performed as noted.  Right Carotid Findings: +----------+--------+--------+--------+--------+------------------+           PSV cm/sEDV cm/sStenosisDescribeComments           +----------+--------+--------+--------+--------+------------------+ CCA Prox  107     26                                          +----------+--------+--------+--------+--------+------------------+ CCA Distal85      23                                         +----------+--------+--------+--------+--------+------------------+ ICA Prox  67      22                      intimal thickening +----------+--------+--------+--------+--------+------------------+ ICA Distal78      33                                         +----------+--------+--------+--------+--------+------------------+ ECA       82      19                                         +----------+--------+--------+--------+--------+------------------+ +----------+--------+-------+----------------+-------------------+  PSV cm/sEDV cmsDescribe        Arm Pressure (mmHG) +----------+--------+-------+----------------+-------------------+ Subclavian114            Multiphasic, WNL                    +----------+--------+-------+----------------+-------------------+ +---------+--------+--+--------+--+---------+ VertebralPSV cm/s46EDV cm/s14Antegrade +---------+--------+--+--------+--+---------+  Left Carotid Findings: +----------+--------+--------+--------+--------+------------------+           PSV cm/sEDV cm/sStenosisDescribeComments           +----------+--------+--------+--------+--------+------------------+ CCA Prox  95      30                                         +----------+--------+--------+--------+--------+------------------+ CCA Distal78      21                                         +----------+--------+--------+--------+--------+------------------+ ICA Prox  58      20                      intimal thickening +----------+--------+--------+--------+--------+------------------+ ICA Distal81      30                                         +----------+--------+--------+--------+--------+------------------+ ECA       85      20                                          +----------+--------+--------+--------+--------+------------------+ +----------+--------+--------+----------------+-------------------+ SubclavianPSV cm/sEDV cm/sDescribe        Arm Pressure (mmHG) +----------+--------+--------+----------------+-------------------+           83              Multiphasic, WNL                    +----------+--------+--------+----------------+-------------------+ +---------+--------+--+--------+--+---------+ VertebralPSV cm/s49EDV cm/s13Antegrade +---------+--------+--+--------+--+---------+  Summary: Right Carotid: There is no evidence of stenosis in the right ICA. Left Carotid: There is no evidence of stenosis in the left ICA. Vertebrals: Bilateral vertebral arteries demonstrate antegrade flow. *See table(s) above for measurements and observations.  Electronically signed by Ruta Hinds MD on 06/27/2018 at 5:48:51 PM.    Final     Impression/Plan:   Favor CT changes to be tx related.  Will monitor.  Patient has f/u with ENT [facility]) in Aug. I'll arrange a CT of neck to precede that .  I will see him for f/u in Nov.  He is pleased with this plan.  He asked for a letter to document the need for his dental consultation w/ Dr Enrique Sack.  I will document the medical necessity of that for him.  This encounter was provided by phone due to pandemic precautions. He couldn't access WebEx. The time spent during this encounter was 8 minutes. The attendants for this meeting include Eppie Gibson  and [name].  During the encounter, Eppie Gibson was located at [facility] Radiation Oncology Department.  [name] was located at home.   _____________________________________   Eppie Gibson, MD  This document serves as a record of  services personally performed by Eppie Gibson, MD. It was created on her behalf by Rae Lips, a trained medical scribe. The creation of this record is based on the scribe's personal observations and the  provider's statements to them. This document has been checked and approved by the attending provider.

## 2018-07-20 ENCOUNTER — Encounter: Payer: Self-pay | Admitting: Radiation Oncology

## 2018-07-20 ENCOUNTER — Other Ambulatory Visit: Payer: Self-pay | Admitting: Radiation Oncology

## 2018-07-20 DIAGNOSIS — C4440 Unspecified malignant neoplasm of skin of scalp and neck: Secondary | ICD-10-CM

## 2018-08-01 ENCOUNTER — Encounter: Payer: Self-pay | Admitting: Physical Therapy

## 2018-08-01 ENCOUNTER — Other Ambulatory Visit: Payer: Self-pay

## 2018-08-01 ENCOUNTER — Ambulatory Visit: Payer: Medicare Other | Attending: Otolaryngology | Admitting: Physical Therapy

## 2018-08-01 DIAGNOSIS — R42 Dizziness and giddiness: Secondary | ICD-10-CM | POA: Insufficient documentation

## 2018-08-01 DIAGNOSIS — R2681 Unsteadiness on feet: Secondary | ICD-10-CM | POA: Diagnosis present

## 2018-08-01 DIAGNOSIS — R2689 Other abnormalities of gait and mobility: Secondary | ICD-10-CM | POA: Insufficient documentation

## 2018-08-02 NOTE — Therapy (Signed)
[facility] [address] [place], [place], [address] Phone: [phone]   Fax:  [phone]  Physical Therapy Evaluation  Patient Details  Name: DENISE BRAMBLETT MRN: [id number] Date of Birth: 17-Sep-1932 Referring Provider (PT): Dr. Melida Quitter    CLINIC OPERATION CHANGES: Outpatient [facility] Rehab is open at lower capacity following universal masking, social distancing, and patient screening.  The patient's COVID risk of  complications score is unknown (not listed in chart).   Encounter Date: 08/01/2018  PT End of Session - 08/02/18 1131    Visit Number  1    Number of Visits  9    Authorization Type  [employer] Medicare    Authorization Time Period  08-01-18 - 10-31-18    PT Start Time  1502    PT Stop Time  1550    PT Time Calculation (min)  48 min    Activity Tolerance  Patient tolerated treatment well    Behavior During Therapy  WFL for tasks assessed/performed       Past Medical History:  Diagnosis Date  . Back pain   . DVT (deep venous thrombosis) ([place])   . Gait abnormality 83/23/2020  . Glaucoma   . Hypertension   . Insomnia   . Pulmonary embolism, bilateral [facility]) May 2012   on coumadin  . Skin cancer    forehead  . Skin cancer   . Vertigo     Past Surgical History:  Procedure Laterality Date  . BACK SURGERY     x3  . EXCISION MASS NECK Right 01/19/2018   Procedure: Wide local excision of right neck skin cancer (5 cm);  Surgeon: Melida Quitter, MD;  Location: [facility];  Service: ENT;  Laterality: Right;  . RADICAL NECK DISSECTION Right 01/19/2018    Right modified radical neck dissection; Cervicopectoral flap (Right Neck)  . RADICAL NECK DISSECTION Right 01/19/2018   Procedure: Right modified radical neck dissection; Cervicopectoral flap;  Surgeon: Melida Quitter, MD;  Location: [facility];  Service: ENT;  Laterality: Right;  . SHOULDER SURGERY     x3  Dr. Karrie Doffing al.  . TOTAL HIP ARTHROPLASTY  2012   Dr. Alvan Dame  right hip  . TRANSTHORACIC ECHOCARDIOGRAM  07/26/2010   Left ventricle: The cavity size was normal. Wall thickness was increased in a pattern of moderate LVH. Systolic function was normal. The estimated ejection fraction was in the range of 55% to 60%    There were no vitals filed for this visit.   Subjective Assessment - 08/02/18 1117    Subjective  Pt reports he feels "disconbobulated" at present time:  denies true room spinning vertigo but more imbalance:  pt ambulating without device - reports he occasionally finds that he involuntarily "speeds up" when walking and must touch something to stop iit; pt states the dizziness/imbalance started about 3-4 yrs and states it has progressively gotten worse; states dizziness is intermittent     Pertinent History  h/o back surgery x 3:  excision mass of neck 01-19-18:  Neck dissection 01-19-18:  Rt THR 2012:  glaucoma; neuropathy??    Patient Stated Goals  "end the dizziness and the balance problem"    Currently in Pain?  No/denies         [facility] PT Assessment - 08/02/18 0001      Assessment   Medical Diagnosis  Vertigo    Referring Provider (PT)  Dr. Melida Quitter    Onset Date/Surgical Date  --  3-4 yrs ago for onset of progressive decline; 07-19-18 referr     Precautions   Precautions  Fall      Balance Screen   Has the patient fallen in the past 6 months  No    Has the patient had a decrease in activity level because of a fear of falling?   No    Is the patient reluctant to leave their home because of a fear of falling?   No      Home Environment   Living Environment  Private residence    Type of [place] to enter    Entrance Stairs-Number of Steps  5    Entrance Stairs-Rails  Right    Home Layout  One level      Prior Function   Level of Independence  Independent      Ambulation/Gait   Gait velocity  12.41   2.64 ft/sec with no device     Standardized Balance Assessment   Standardized Balance  Assessment  Berg Balance Test;Timed Up and Go Test      Berg Balance Test   Sit to Stand  Able to stand without using hands and stabilize independently    Standing Unsupported  Able to stand safely 2 minutes    Sitting with Back Unsupported but Feet Supported on Floor or Stool  Able to sit safely and securely 2 minutes    Stand to Sit  Sits safely with minimal use of hands    Transfers  Able to transfer safely, minor use of hands    Standing Unsupported with Eyes Closed  Able to stand 10 seconds with supervision    Standing Unsupported with Feet Together  Able to place feet together independently and stand for 1 minute with supervision    From Standing, Reach Forward with Outstretched Arm  Can reach forward >12 cm safely (5")    From Standing Position, Pick up Object from Floor  Able to pick up shoe, needs supervision    From Standing Position, Turn to Look Behind Over each Shoulder  Looks behind one side only/other side shows less weight shift   incr. Rt weight shift    Turn 360 Degrees  Able to turn 360 degrees safely but slowly   R=6.91 L= 7.2   Standing Unsupported, Alternately Place Feet on Step/Stool  Able to complete >2 steps/needs minimal assist    Standing Unsupported, One Foot in Front  Able to plae foot ahead of the other independently and hold 30 seconds    Standing on One Leg  Tries to lift leg/unable to hold 3 seconds but remains standing independently    Total Score  42      Timed Up and Go Test   Normal TUG (seconds)  26.87   no device          Vestibular Assessment - 08/01/18 1536      Symptom Behavior   Subjective history of current problem  dizziness 4-5/10     Type of Dizziness   Blurred vision;Imbalance;Unsteady with head/body turns;Lightheadedness;"Funny feeling in head"    Symptom Nature  Constant    Progression of Symptoms  Worse          Objective measurements completed on examination: See above findings.              PT Education -  08/02/18 1129    Education Details  verbally instructed pt in SLS - plan  to give picture of this exercise next session    Person(s) Educated  Patient    Methods  Explanation;Demonstration    Comprehension  Verbalized understanding       PT Short Term Goals - 08/02/18 1330      PT SHORT TERM GOAL #1   Title  Perform SOT and establish LTG as appropriate based on score/results.    Time  4    Period  Weeks    Status  New    Target Date  08/31/18      PT SHORT TERM GOAL #2   Title  Increase Berg score from 42/56 to >/= 47/56 to decrease fall risk.    Baseline  42/56    Time  4    Period  Weeks    Status  New    Target Date  08/31/18      PT SHORT TERM GOAL #3   Title  Improve TUG score from 26.87 secs to </= 21 secs without device to demo improved mobility with decr. fall risk.    Baseline  26.87 secs    Time  4    Period  Weeks    Status  New    Target Date  08/31/18      PT SHORT TERM GOAL #4   Title  Increase gait velocity from 2.64 ft/sec to >/= 3.0 ft/sec for incr. gait efficiency.      Baseline  2.64 ft/sec = 12.42 secs     Time  4    Period  Weeks    Status  New    Target Date  08/31/18      PT SHORT TERM GOAL #5   Title  Independent in HEP for balance exercises.    Time  4    Period  Weeks    Status  New    Target Date  08/31/18        PT Long Term Goals - 08/02/18 1326      PT LONG TERM GOAL #1   Title  Perform Sensory Organization Test and establish goal as appropriate.    Time  8    Period  Weeks    Status  New    Target Date  10/01/18      PT LONG TERM GOAL #2   Title  Improve TUG score from 26.87 secs without device to </= 18 secs without device to reduce fall risk.    Baseline  26.87 secs no device    Time  8    Period  Weeks    Status  New    Target Date  10/01/18      PT LONG TERM GOAL #3   Title  Increase gait velocity from 2.64 ft/sec to 3.3 ft/sec without device for incr. gait efficiency.    Baseline  2.64 ft/sec =12.42 secs    Time   8    Period  Weeks    Status  New    Target Date  10/01/18      PT LONG TERM GOAL #4   Title  Improve DHI score by at least 15 points to demo improvement in dizziness.    Baseline  DHI to be assessed    Time  8    Period  Weeks    Status  New    Target Date  10/01/18      PT LONG TERM GOAL #5   Title  Independent in updated HEP as appropriate.  Time  8    Period  Weeks    Status  New    Target Date  10/01/18             Plan - 08/02/18 1132    Clinical Impression Statement  Pt is an 85 yr old gentleman with c/o dizziness/dysequilibrium, balance and gait dysfunction which he states started approx. 3-4 yrs ago and has progressively worsened.  Pt denies room spinning vertigo but reports difficulty maintaining balance in shower and at night when in the dark.  Pt reports he feels "discombobulated" and states he feels this way in sitting as well as in standing.  Pt states he may have some numbness/tingling in his feet but states he has not been diagnosed with peripheral neuropathy at current time.  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  Pt reported dizziness with sitting up from Rt and Lt sidelying positions but no nystagmus noted with any positional testing.      Personal Factors and Comorbidities  Comorbidity 2;Time since onset of injury/illness/exacerbation;Age    Examination-Activity Limitations  Locomotion Level;Transfers;Stand;Dressing;Bed Mobility;Stairs    Examination-Participation Restrictions  Meal Prep;Cleaning;Community Activity;Shop;Yard Work    [employer] officer  Evolving/Moderate complexity    Clinical Decision Making  Moderate    Rehab Potential  Good    PT Frequency  2x / week    PT Duration  8 weeks    PT Treatment/Interventions  ADLs/Self Care Home Management;Stair training;Therapeutic activities;Gait training;Balance training;Neuromuscular  re-education;Therapeutic exercise;Patient/family education;Vestibular    PT Next Visit Plan  do SOT - give HEP (SLS included)    PT Home Exercise Plan  SLS, balance on foam, heel raises marching in place with EO and EC    Consulted and Agree with Plan of Care  Patient       Patient will benefit from skilled therapeutic intervention in order to improve the following deficits and impairments:  Decreased balance, Dizziness, Difficulty walking, Impaired sensation  Visit Diagnosis: Dizziness and giddiness - Plan: PT plan of care cert/re-cert  Other abnormalities of gait and mobility - Plan: PT plan of care cert/re-cert  Unsteadiness on feet - Plan: PT plan of care cert/re-cert     Problem List Patient Active Problem List   Diagnosis Date Noted  . Gait abnormality 083/23/2020  . Skin cancer of scalp or skin of neck 01/19/2018  . Abnormal chest x-ray 02/17/2011  . Osteoarthritis 11/18/2010  . Pulmonary embolism ([place]) 08/15/2010  . Chronic anticoagulation 08/15/2010  . Lung nodule 08/15/2010  . Cor pulmonale ([place]) 08/15/2010  . Dyspnea 07/25/2010  . Glaucoma   . Back pain   . Skin cancer   . Insomnia     Ayodele Hartsock, [place], PT 08/02/2018, 1:47 PM  [facility] [address] [place], [place], [address] Phone: [phone]   Fax:  [phone]  Name: OLUWATOMIWA KINYON MRN: [id number] Date of Birth: 1932-05-16

## 2018-08-15 ENCOUNTER — Ambulatory Visit: Payer: Medicare Other | Admitting: Physical Therapy

## 2018-08-15 ENCOUNTER — Other Ambulatory Visit: Payer: Self-pay

## 2018-08-15 DIAGNOSIS — R2689 Other abnormalities of gait and mobility: Secondary | ICD-10-CM

## 2018-08-15 DIAGNOSIS — R42 Dizziness and giddiness: Secondary | ICD-10-CM | POA: Diagnosis not present

## 2018-08-15 DIAGNOSIS — R2681 Unsteadiness on feet: Secondary | ICD-10-CM

## 2018-08-15 NOTE — Patient Instructions (Signed)
Gaze Stabilization: Tip Card  1.Target must remain in focus, not blurry, and appear stationary while head is in motion. 2.Perform exercises with small head movements (45 to either side of midline). 3.Increase speed of head motion so long as target is in focus. 4.If you wear eyeglasses, be sure you can see target through lens (therapist will give specific instructions for bifocal / progressive lenses). 5.These exercises may provoke dizziness or nausea. Work through these symptoms. If too dizzy, slow head movement slightly. Rest between each exercise. 6.Exercises demand concentration; avoid distractions. 7.For safety, perform standing exercises close to a counter, wall, corner, or next to someone.    Gaze Stabilization: Sitting    Keeping eyes on target on wall \\_10N  feet away, tilt head down 15-30 and move head side to side for _60___ seconds. Repeat while moving head up and down for __60__ seconds. Do _3___ sessions per day. PLAIN BACKGROUND   Feet Apart, Head Motion - Eyes Open and then also STAND WITH EYES CLOSED - TURN HEAD SIDE TO SIDE AND UP/DOWN    With eyes open, feet apart, move head slowly: up and down AND SIDE TO SIDE Repeat _1-2___ times per session. Do _1___ sessions per day.  Do 5-10 HEAD TURNS EACH WAY    Feet Together, Head Motion - Eyes Open    With eyes open, feet together, move head slowly: up and down. Repeat _2___ times per session. Do _1-2___ sessions per day.  Do 5-10 HEAD TURNS EACH WAY - SIDE TO SIDE AND UP/DOWN

## 2018-08-16 NOTE — Therapy (Signed)
[facility] [address] [place], [place], [address] Phone: [phone]   Fax:  [phone]  Physical Therapy Treatment  Patient Details  Name: [name] MRN: [id number] Date of Birth: 1933/02/16 Referring Provider (PT): Dr. Melida Quitter  CLINIC OPERATION CHANGES: Outpatient Neuro Rehab is open at lower capacity following universal masking, social distancing, and patient screening.  The patient's COVID risk of  complications score is 5.  Encounter Date: 08/15/2018  PT End of Session - 08/16/18 2113    Visit Number  2    Number of Visits  17    Authorization Type  [employer] Medicare    Authorization Time Period  08-01-18 - 10-31-18    PT Start Time  1403    PT Stop Time  1449    PT Time Calculation (min)  46 min    Equipment Utilized During Treatment  Gait belt    Activity Tolerance  Patient tolerated treatment well    Behavior During Therapy  WFL for tasks assessed/performed       Past Medical History:  Diagnosis Date  . Back pain   . DVT (deep venous thrombosis) ([place])   . Gait abnormality 05/23/2018  . Glaucoma   . Hypertension   . Insomnia   . Pulmonary embolism, bilateral [facility]) May 2012   on coumadin  . Skin cancer    forehead  . Skin cancer   . Vertigo     Past Surgical History:  Procedure Laterality Date  . BACK SURGERY     x3  . EXCISION MASS NECK Right 01/19/2018   Procedure: Wide local excision of right neck skin cancer (5 cm);  Surgeon: Melida Quitter, MD;  Location: [facility];  Service: ENT;  Laterality: Right;  . RADICAL NECK DISSECTION Right 01/19/2018    Right modified radical neck dissection; Cervicopectoral flap (Right Neck)  . RADICAL NECK DISSECTION Right 01/19/2018   Procedure: Right modified radical neck dissection; Cervicopectoral flap;  Surgeon: Melida Quitter, MD;  Location: [facility];  Service: ENT;  Laterality: Right;  . SHOULDER SURGERY     x3  Dr. Karrie Doffing al.  . TOTAL HIP ARTHROPLASTY   2012   Dr. Alvan Dame right hip  . TRANSTHORACIC ECHOCARDIOGRAM  07/26/2010   Left ventricle: The cavity size was normal. Wall thickness was increased in a pattern of moderate LVH. Systolic function was normal. The estimated ejection fraction was in the range of 55% to 60%    There were no vitals filed for this visit.  Subjective Assessment - 08/15/18 1603    Subjective  Pt reports he feels a little dizzy at this time - rates dizziness 4/10 intensity    Pertinent History  h/o back surgery x 3:  excision mass of neck 01-19-18:  Neck dissection 01-19-18:  Rt THR 2012:  glaucoma; neuropathy??    Patient Stated Goals  "end the dizziness and the balance problem"    Currently in Pain?  No/denies                       [facility] Adult PT Treatment/Exercise - 08/16/18 0001      Transfers   Transfers  Sit to Stand    Number of Reps  Other reps (comment)   5         Balance Exercises - 08/16/18 2107      Balance Exercises: Standing   Standing Eyes Opened  Wide (BOA);Head turns;Foam/compliant surface;5 reps  Standing Eyes Closed  Wide (BOA);5 reps;Solid surface;Head turns    Rockerboard  Anterior/posterior;10 reps;UE support    Step Ups  Forward;UE support 1   10 reps each leg   Sidestepping  2 reps    Other Standing Exercises  standing on blue [employer] inside // bars with UE support - head turns side to side 5 reps;  Marching in place on floor with CGA to min assist         PT Education - 08/16/18 2111    Education Details  gaze stabilization, standing with feet apart/together EO and EC on floor    Person(s) Educated  Patient    Methods  Explanation;Demonstration;Handout    Comprehension  Verbalized understanding;Returned demonstration       PT Short Term Goals - 08/16/18 2119      PT SHORT TERM GOAL #1   Title  Perform SOT and establish LTG as appropriate based on score/results.    Time  4    Period  Weeks    Status  New    Target Date  08/31/18      PT SHORT TERM  GOAL #2   Title  Increase Berg score from 42/56 to >/= 47/56 to decrease fall risk.    Baseline  42/56    Time  4    Period  Weeks    Status  New    Target Date  08/31/18      PT SHORT TERM GOAL #3   Title  Improve TUG score from 26.87 secs to </= 21 secs without device to demo improved mobility with decr. fall risk.    Baseline  26.87 secs    Time  4    Period  Weeks    Status  New    Target Date  08/31/18      PT SHORT TERM GOAL #4   Title  Increase gait velocity from 2.64 ft/sec to >/= 3.0 ft/sec for incr. gait efficiency.      Baseline  2.64 ft/sec = 12.42 secs     Time  4    Period  Weeks    Status  New    Target Date  08/31/18      PT SHORT TERM GOAL #5   Title  Independent in HEP for balance exercises.    Time  4    Period  Weeks    Status  New    Target Date  08/31/18        PT Long Term Goals - 08/16/18 2120      PT LONG TERM GOAL #1   Title  Perform Sensory Organization Test and establish goal as appropriate.    Time  8    Period  Weeks    Status  New      PT LONG TERM GOAL #2   Title  Improve TUG score from 26.87 secs without device to </= 18 secs without device to reduce fall risk.    Baseline  26.87 secs no device    Time  8    Period  Weeks    Status  New      PT LONG TERM GOAL #3   Title  Increase gait velocity from 2.64 ft/sec to 3.3 ft/sec without device for incr. gait efficiency.    Baseline  2.64 ft/sec =12.42 secs    Time  8    Period  Weeks    Status  New      PT LONG TERM  GOAL #4   Title  Improve DHI score by at least 15 points to demo improvement in dizziness.    Baseline  DHI to be assessed    Time  8    Period  Weeks    Status  New      PT LONG TERM GOAL #5   Title  Independent in updated HEP as appropriate.    Time  8    Period  Weeks    Status  New            Plan - 08/16/18 2113    Clinical Impression Statement  Pt demonstrates significant vestibular hypofunction as evidenced by LOB with standing on compliant  surface with EC; pt also has decreased high level balance skills, specifically SLS deficits on each leg.  Etiology of vertigo appears to be of central origin.    Personal Factors and Comorbidities  Comorbidity 2;Time since onset of injury/illness/exacerbation;Age    Examination-Activity Limitations  Locomotion Level;Transfers;Stand;Dressing;Bed Mobility;Stairs    Examination-Participation Restrictions  Meal Prep;Cleaning;Community Activity;Shop;Yard Work    [employer] officer  Evolving/Moderate complexity    Rehab Potential  Good    PT Frequency  2x / week    PT Duration  8 weeks    PT Treatment/Interventions  ADLs/Self Care Home Management;Stair training;Therapeutic activities;Gait training;Balance training;Neuromuscular re-education;Therapeutic exercise;Patient/family education;Vestibular    PT Next Visit Plan  do SOT - give HEP (SLS included)    PT Home Exercise Plan  SLS, balance on foam, heel raises marching in place with EO and EC    Consulted and Agree with Plan of Care  Patient       Patient will benefit from skilled therapeutic intervention in order to improve the following deficits and impairments:  Decreased balance, Dizziness, Difficulty walking, Impaired sensation  Visit Diagnosis: 1. Dizziness and giddiness   2. Other abnormalities of gait and mobility   3. Unsteadiness on feet        Problem List Patient Active Problem List   Diagnosis Date Noted  . Gait abnormality 05/23/2018  . Skin cancer of scalp or skin of neck 01/19/2018  . Abnormal chest x-ray 02/17/2011  . Osteoarthritis 11/18/2010  . Pulmonary embolism ([place]) 08/15/2010  . Chronic anticoagulation 08/15/2010  . Lung nodule 08/15/2010  . Cor pulmonale ([place]) 08/15/2010  . Dyspnea 07/25/2010  . Glaucoma   . Back pain   . Skin cancer   . Insomnia     Gordie Crumby, [place], PT 08/16/2018, 9:21 PM  [facility] [address] [place], [place], [address] Phone: [phone]   Fax:  [phone]  Name: [name] MRN: [id number] Date of Birth: January 22, 1933

## 2018-08-25 ENCOUNTER — Other Ambulatory Visit: Payer: Self-pay

## 2018-08-25 ENCOUNTER — Ambulatory Visit: Payer: Medicare Other | Admitting: Physical Therapy

## 2018-08-25 DIAGNOSIS — R42 Dizziness and giddiness: Secondary | ICD-10-CM | POA: Diagnosis not present

## 2018-08-25 DIAGNOSIS — R2681 Unsteadiness on feet: Secondary | ICD-10-CM

## 2018-08-25 DIAGNOSIS — R2689 Other abnormalities of gait and mobility: Secondary | ICD-10-CM

## 2018-08-26 NOTE — Therapy (Signed)
[facility] [address] [place], [place], [address] Phone: [phone]   Fax:  [phone]  Physical Therapy Treatment  Patient Details  Name: [name] MRN: [id number] Date of Birth: 08-Feb-1933 Referring Provider (PT): Dr. Melida Quitter  CLINIC OPERATION CHANGES: Outpatient Neuro Rehab is open at lower capacity following universal masking, social distancing, and patient screening.  The patient's COVID risk of complications score is 5.  Encounter Date: 08/25/2018  PT End of Session - 08/26/18 1645    Visit Number  3    Number of Visits  17    Authorization Type  [employer] Medicare    Authorization Time Period  08-01-18 - 10-31-18    PT Start Time  1401    PT Stop Time  1447    PT Time Calculation (min)  46 min    Equipment Utilized During Treatment  --    Activity Tolerance  Patient tolerated treatment well    Behavior During Therapy  WFL for tasks assessed/performed       Past Medical History:  Diagnosis Date  . Back pain   . DVT (deep venous thrombosis) ([place])   . Gait abnormality 05/23/2018  . Glaucoma   . Hypertension   . Insomnia   . Pulmonary embolism, bilateral [facility]) May 2012   on coumadin  . Skin cancer    forehead  . Skin cancer   . Vertigo     Past Surgical History:  Procedure Laterality Date  . BACK SURGERY     x3  . EXCISION MASS NECK Right 01/19/2018   Procedure: Wide local excision of right neck skin cancer (5 cm);  Surgeon: Melida Quitter, MD;  Location: [facility];  Service: ENT;  Laterality: Right;  . RADICAL NECK DISSECTION Right 01/19/2018    Right modified radical neck dissection; Cervicopectoral flap (Right Neck)  . RADICAL NECK DISSECTION Right 01/19/2018   Procedure: Right modified radical neck dissection; Cervicopectoral flap;  Surgeon: Melida Quitter, MD;  Location: [facility];  Service: ENT;  Laterality: Right;  . SHOULDER SURGERY     x3  Dr. Karrie Doffing al.  . TOTAL HIP ARTHROPLASTY  2012    Dr. Alvan Dame right hip  . TRANSTHORACIC ECHOCARDIOGRAM  07/26/2010   Left ventricle: The cavity size was normal. Wall thickness was increased in a pattern of moderate LVH. Systolic function was normal. The estimated ejection fraction was in the range of 55% to 60%    There were no vitals filed for this visit.  Subjective Assessment - 08/26/18 1642    Subjective  Pt reports he is not having a real good day - states he is feeling "discombobulated" in his head    Pertinent History  h/o back surgery x 3:  excision mass of neck 01-19-18:  Neck dissection 01-19-18:  Rt THR 2012:  glaucoma; neuropathy??    Patient Stated Goals  "end the dizziness and the balance problem"    Currently in Pain?  No/denies                    NeuroRe-ed:  Standing on pillows in corner - feet apart - EO with head turns side to side and up/down; EC with head turns  Alternate tap downs to floor from pillows in corner  Marching on blue mat with CGA to min assist  Stepping over and back of balance beam with UE support prn - 5 reps each leg for improved SLS on each leg  Balance Exercises - 08/26/18 1643      Balance Exercises: Standing   Standing Eyes Opened  Wide (BOA);Head turns;Foam/compliant surface;5 reps    Standing Eyes Closed  Wide (BOA);5 reps;Solid surface;Head turns    Rockerboard  Anterior/posterior;10 reps;UE support    Other Standing Exercises  standing on blue mat on floor - performed 3 forward kicks and then requested to sit due to increased dizziness           PT Short Term Goals - 08/26/18 1648      PT SHORT TERM GOAL #1   Title  Perform SOT and establish LTG as appropriate based on score/results.    Time  4    Period  Weeks    Status  New    Target Date  08/31/18      PT SHORT TERM GOAL #2   Title  Increase Berg score from 42/56 to >/= 47/56 to decrease fall risk.    Baseline  42/56    Time  4    Period  Weeks    Status  New    Target Date  08/31/18      PT  SHORT TERM GOAL #3   Title  Improve TUG score from 26.87 secs to </= 21 secs without device to demo improved mobility with decr. fall risk.    Baseline  26.87 secs    Time  4    Period  Weeks    Status  New    Target Date  08/31/18      PT SHORT TERM GOAL #4   Title  Increase gait velocity from 2.64 ft/sec to >/= 3.0 ft/sec for incr. gait efficiency.      Baseline  2.64 ft/sec = 12.42 secs     Time  4    Period  Weeks    Status  New    Target Date  08/31/18      PT SHORT TERM GOAL #5   Title  Independent in HEP for balance exercises.    Time  4    Period  Weeks    Status  New    Target Date  08/31/18        PT Long Term Goals - 08/26/18 1648      PT LONG TERM GOAL #1   Title  Perform Sensory Organization Test and establish goal as appropriate.    Time  8    Period  Weeks    Status  New      PT LONG TERM GOAL #2   Title  Improve TUG score from 26.87 secs without device to </= 18 secs without device to reduce fall risk.    Baseline  26.87 secs no device    Time  8    Period  Weeks    Status  New      PT LONG TERM GOAL #3   Title  Increase gait velocity from 2.64 ft/sec to 3.3 ft/sec without device for incr. gait efficiency.    Baseline  2.64 ft/sec =12.42 secs    Time  8    Period  Weeks    Status  New      PT LONG TERM GOAL #4   Title  Improve DHI score by at least 15 points to demo improvement in dizziness.    Baseline  DHI to be assessed    Time  8    Period  Weeks    Status  New  PT LONG TERM GOAL #5   Title  Independent in updated HEP as appropriate.    Time  8    Period  Weeks    Status  New            Plan - 08/26/18 1645    Clinical Impression Statement  Pt reports no significant changes in functional status and continues to c/o feeling "discombobulated" in his head with no change in dizziness; pt states he does not feel that the therapy is helping alot; reports he will attend another session or 2 but may ID/C early due to no significant   benefit at this time    Personal Factors and Comorbidities  Comorbidity 2;Time since onset of injury/illness/exacerbation;Age    Examination-Activity Limitations  Locomotion Level;Transfers;Stand;Dressing;Bed Mobility;Stairs    Examination-Participation Restrictions  Meal Prep;Cleaning;Community Activity;Shop;Yard Work    [employer] officer  Evolving/Moderate complexity    Rehab Potential  Good    PT Frequency  2x / week    PT Duration  8 weeks    PT Treatment/Interventions  ADLs/Self Care Home Management;Stair training;Therapeutic activities;Gait training;Balance training;Neuromuscular re-education;Therapeutic exercise;Patient/family education;Vestibular    PT Next Visit Plan  do SOT - give HEP (SLS included)    PT Home Exercise Plan  SLS, balance on foam, heel raises marching in place with EO and EC    Consulted and Agree with Plan of Care  Patient       Patient will benefit from skilled therapeutic intervention in order to improve the following deficits and impairments:  Decreased balance, Dizziness, Difficulty walking, Impaired sensation  Visit Diagnosis: 1. Dizziness and giddiness   2. Other abnormalities of gait and mobility   3. Unsteadiness on feet        Problem List Patient Active Problem List   Diagnosis Date Noted  . Gait abnormality 05/23/2018  . Skin cancer of scalp or skin of neck 01/19/2018  . Abnormal chest x-ray 02/17/2011  . Osteoarthritis 11/18/2010  . Pulmonary embolism ([place]) 08/15/2010  . Chronic anticoagulation 08/15/2010  . Lung nodule 08/15/2010  . Cor pulmonale ([place]) 08/15/2010  . Dyspnea 07/25/2010  . Glaucoma   . Back pain   . Skin cancer   . Insomnia     Kimbely Whiteaker, [place], PT 08/26/2018, 4:51 PM  [facility] [address] [place], [place], [address] Phone: [phone]   Fax:  [phone]  Name: JAMYSON JIRAK MRN: [id number] Date of Birth: [birth year]

## 2018-08-29 ENCOUNTER — Other Ambulatory Visit: Payer: Self-pay

## 2018-08-29 ENCOUNTER — Ambulatory Visit: Payer: Medicare Other | Admitting: Physical Therapy

## 2018-08-29 DIAGNOSIS — R42 Dizziness and giddiness: Secondary | ICD-10-CM | POA: Diagnosis not present

## 2018-08-29 DIAGNOSIS — R2681 Unsteadiness on feet: Secondary | ICD-10-CM

## 2018-08-29 DIAGNOSIS — R2689 Other abnormalities of gait and mobility: Secondary | ICD-10-CM

## 2018-08-30 NOTE — Therapy (Signed)
[facility] [address] [place], [place], [address] Phone: [phone]   Fax:  [phone]  Physical Therapy Treatment  Patient Details  Name: [name] MRN: [id number] Date of Birth: [birth year] Referring Provider (PT): Dr. Melida Quitter  CLINIC OPERATION CHANGES: Outpatient Neuro Rehab is open at lower capacity following universal masking, social distancing, and patient screening.  The patient's COVID risk of complications score is 5.  Encounter Date: 08/29/2018  PT End of Session - 08/30/18 2005    Visit Number  4    Number of Visits  17    Authorization Type  [employer] Medicare    Authorization Time Period  08-01-18 - 10-31-18    PT Start Time  1503    PT Stop Time  1548    PT Time Calculation (min)  45 min    Equipment Utilized During Treatment  Gait belt    Activity Tolerance  Patient tolerated treatment well    Behavior During Therapy  WFL for tasks assessed/performed       Past Medical History:  Diagnosis Date  . Back pain   . DVT (deep venous thrombosis) ([place])   . Gait abnormality 05/23/2018  . Glaucoma   . Hypertension   . Insomnia   . Pulmonary embolism, bilateral [facility]) May 2012   on coumadin  . Skin cancer    forehead  . Skin cancer   . Vertigo     Past Surgical History:  Procedure Laterality Date  . BACK SURGERY     x3  . EXCISION MASS NECK Right 01/19/2018   Procedure: Wide local excision of right neck skin cancer (5 cm);  Surgeon: Melida Quitter, MD;  Location: [facility];  Service: ENT;  Laterality: Right;  . RADICAL NECK DISSECTION Right 01/19/2018    Right modified radical neck dissection; Cervicopectoral flap (Right Neck)  . RADICAL NECK DISSECTION Right 01/19/2018   Procedure: Right modified radical neck dissection; Cervicopectoral flap;  Surgeon: Melida Quitter, MD;  Location: [facility];  Service: ENT;  Laterality: Right;  . SHOULDER SURGERY     x3  Dr. Karrie Doffing al.  . TOTAL HIP ARTHROPLASTY   2012   Dr. Alvan Dame right hip  . TRANSTHORACIC ECHOCARDIOGRAM  07/26/2010   Left ventricle: The cavity size was normal. Wall thickness was increased in a pattern of moderate LVH. Systolic function was normal. The estimated ejection fraction was in the range of 55% to 60%    There were no vitals filed for this visit.  Subjective Assessment - 08/30/18 2002    Subjective  Pt reports no significant changes in his dizziness - states he felt a little disoriented when he got out of his truck to come into clinic but is feeling better now    Pertinent History  h/o back surgery x 3:  excision mass of neck 01-19-18:  Neck dissection 01-19-18:  Rt THR 2012:  glaucoma; neuropathy??    Patient Stated Goals  "end the dizziness and the balance problem"    Currently in Pain?  No/denies                 NeuroRe-ed:  Standing on pillows in corner - feet apart - EO with head turns side to side and up/down; EC with head turns  Alternate tap downs to floor from pillows in corner  Marching on blue mat with CGA to min assist  Stepping over and back of balance beam with UE support prn - 5 reps  each leg for improved SLS on each leg    x1 viewing in standing position 60 secs horizontal and 60 secs vertical - pt reports minimal increase in dizziness with each exercise            Balance Exercises - 08/30/18 2004      Balance Exercises: Standing   Standing Eyes Opened  Wide (BOA);Head turns;Foam/compliant surface;5 reps    Standing Eyes Closed  Wide (BOA);5 reps;Solid surface;Head turns    Rockerboard  Anterior/posterior;10 reps;UE support    Step Ups  Forward;6 inch;UE support 1    Sidestepping  1 rep    Other Standing Exercises  standing on blue [employer] inside // bars with UE support - head turns side to side 5 reps;  Marching in place on floor with CGA to min assist           PT Short Term Goals - 08/30/18 2009      PT SHORT TERM GOAL #1   Title  Perform SOT and establish LTG as  appropriate based on score/results.    Time  4    Period  Weeks    Status  New    Target Date  08/31/18      PT SHORT TERM GOAL #2   Title  Increase Berg score from 42/56 to >/= 47/56 to decrease fall risk.    Baseline  42/56    Time  4    Period  Weeks    Status  New    Target Date  08/31/18      PT SHORT TERM GOAL #3   Title  Improve TUG score from 26.87 secs to </= 21 secs without device to demo improved mobility with decr. fall risk.    Baseline  26.87 secs    Time  4    Period  Weeks    Status  New    Target Date  08/31/18      PT SHORT TERM GOAL #4   Title  Increase gait velocity from 2.64 ft/sec to >/= 3.0 ft/sec for incr. gait efficiency.      Baseline  2.64 ft/sec = 12.42 secs     Time  4    Period  Weeks    Status  New    Target Date  08/31/18      PT SHORT TERM GOAL #5   Title  Independent in HEP for balance exercises.    Time  4    Period  Weeks    Status  New    Target Date  08/31/18        PT Long Term Goals - 08/30/18 2009      PT LONG TERM GOAL #1   Title  Perform Sensory Organization Test and establish goal as appropriate.    Time  8    Period  Weeks    Status  New      PT LONG TERM GOAL #2   Title  Improve TUG score from 26.87 secs without device to </= 18 secs without device to reduce fall risk.    Baseline  26.87 secs no device    Time  8    Period  Weeks    Status  New      PT LONG TERM GOAL #3   Title  Increase gait velocity from 2.64 ft/sec to 3.3 ft/sec without device for incr. gait efficiency.    Baseline  2.64 ft/sec =12.42 secs    Time  8  Period  Weeks    Status  New      PT LONG TERM GOAL #4   Title  Improve DHI score by at least 15 points to demo improvement in dizziness.    Baseline  DHI to be assessed    Time  8    Period  Weeks    Status  New      PT LONG TERM GOAL #5   Title  Independent in updated HEP as appropriate.    Time  8    Period  Weeks    Status  New            Plan - 08/30/18 2006     Clinical Impression Statement  Pt continues to c/o feeling "discombobulated" in his head with slight visual disturbance at times - states he has a "film" that comes over his eyes but resolves with time.  Pt states no major improvements noted with dizziness with PT - requests to be placed on hold after this week until he sees neurologist in late July.    Personal Factors and Comorbidities  Comorbidity 2;Time since onset of injury/illness/exacerbation;Age    Examination-Activity Limitations  Locomotion Level;Transfers;Stand;Dressing;Bed Mobility;Stairs    Examination-Participation Restrictions  Meal Prep;Cleaning;Community Activity;Shop;Yard Work    [employer] officer  Evolving/Moderate complexity    Rehab Potential  Good    PT Frequency  2x / week    PT Duration  8 weeks    PT Treatment/Interventions  ADLs/Self Care Home Management;Stair training;Therapeutic activities;Gait training;Balance training;Neuromuscular re-education;Therapeutic exercise;Patient/family education;Vestibular    PT Next Visit Plan  do SOT - give HEP (SLS included)    PT Home Exercise Plan  SLS, balance on foam, heel raises marching in place with EO and EC    Consulted and Agree with Plan of Care  Patient       Patient will benefit from skilled therapeutic intervention in order to improve the following deficits and impairments:  Decreased balance, Dizziness, Difficulty walking, Impaired sensation  Visit Diagnosis: 1. Dizziness and giddiness   2. Other abnormalities of gait and mobility   3. Unsteadiness on feet        Problem List Patient Active Problem List   Diagnosis Date Noted  . Gait abnormality 05/23/2018  . Skin cancer of scalp or skin of neck 01/19/2018  . Abnormal chest x-ray 02/17/2011  . Osteoarthritis 11/18/2010  . Pulmonary embolism ([place]) 08/15/2010  . Chronic anticoagulation 08/15/2010  . Lung nodule 08/15/2010  . Cor pulmonale ([address]) 08/15/2010  . Dyspnea 07/25/2010  . Glaucoma    . Back pain   . Skin cancer   . Insomnia     Fredricka Kohrs, [place], PT 08/30/2018, 8:10 PM  [facility] [address] [place], [place], [address] Phone: [phone]   Fax:  [phone]  Name: [name] MRN: [id number] Date of Birth: March 22, 1932

## 2018-09-01 ENCOUNTER — Ambulatory Visit: Payer: Medicare Other | Attending: Otolaryngology | Admitting: Physical Therapy

## 2018-09-01 ENCOUNTER — Other Ambulatory Visit: Payer: Self-pay

## 2018-09-01 DIAGNOSIS — R2689 Other abnormalities of gait and mobility: Secondary | ICD-10-CM

## 2018-09-01 DIAGNOSIS — R2681 Unsteadiness on feet: Secondary | ICD-10-CM

## 2018-09-01 NOTE — Patient Instructions (Signed)
SHOULDER: Abduction / Adduction (Cane)    Hold cane with both hands. Raise arms out to RIGHT SIDE: Hold __3_ seconds each side. Use NO  weight on cane. _10__ reps per set, _1__ sets per day, _5__ days per week     SHOULDER: Flexion - Supine (Cane)    Hold cane in both hands. Raise arms up overhead. Do not allow back to arch. Hold _3__ seconds. 10___ reps per set, _2__ sets per day, _5__ days per week

## 2018-09-02 NOTE — Therapy (Signed)
[facility] [address] [place], [place], [address] Phone: [phone]   Fax:  [phone]  Physical Therapy Treatment  Patient Details  Name: [name] MRN: [id number] Date of Birth: [birth year] Referring Provider (PT): Dr. Melida Quitter  CLINIC OPERATION CHANGES: [facility] is open at lower capacity following universal masking, social distancing, and patient screening.  The patient's COVID risk of complications score is 5.   Encounter Date: 09/01/2018  PT End of Session - 09/02/18 1629    Visit Number  5    Number of Visits  17    Authorization Type  [employer] Medicare    Authorization Time Period  08-01-18 - 10-31-18    PT Start Time  1502    PT Stop Time  1548    PT Time Calculation (min)  46 min    Equipment Utilized During Treatment  Gait belt    Activity Tolerance  Patient tolerated treatment well    Behavior During Therapy  WFL for tasks assessed/performed       Past Medical History:  Diagnosis Date  . Back pain   . DVT (deep venous thrombosis) ([place])   . Gait abnormality 05/23/2018  . Glaucoma   . Hypertension   . Insomnia   . Pulmonary embolism, bilateral [facility]) May 2012   on coumadin  . Skin cancer    forehead  . Skin cancer   . Vertigo     Past Surgical History:  Procedure Laterality Date  . BACK SURGERY     x3  . EXCISION MASS NECK Right 01/19/2018   Procedure: Wide local excision of right neck skin cancer (5 cm);  Surgeon: Melida Quitter, MD;  Location: [facility];  Service: ENT;  Laterality: Right;  . RADICAL NECK DISSECTION Right 01/19/2018    Right modified radical neck dissection; Cervicopectoral flap (Right Neck)  . RADICAL NECK DISSECTION Right 01/19/2018   Procedure: Right modified radical neck dissection; Cervicopectoral flap;  Surgeon: Melida Quitter, MD;  Location: [facility];  Service: ENT;  Laterality: Right;  . SHOULDER SURGERY     x3  Dr. Karrie Doffing al.  . TOTAL HIP ARTHROPLASTY   2012   Dr. Alvan Dame right hip  . TRANSTHORACIC ECHOCARDIOGRAM  07/26/2010   Left ventricle: The cavity size was normal. Wall thickness was increased in a pattern of moderate LVH. Systolic function was normal. The estimated ejection fraction was in the range of 55% to 60%    There were no vitals filed for this visit.  Subjective Assessment - 09/01/18 1501    Subjective  Pt reports no significant changes in his dizziness - wants to be placed on hold    Pertinent History  h/o back surgery x 3:  excision mass of neck 01-19-18:  Neck dissection 01-19-18:  Rt THR 2012:  glaucoma; neuropathy??    Patient Stated Goals  "end the dizziness and the balance problem"    Currently in Pain?  No/denies         [facility] PT Assessment - 09/02/18 0001      Berg Balance Test   Sit to Stand  Able to stand without using hands and stabilize independently    Standing Unsupported  Able to stand safely 2 minutes    Sitting with Back Unsupported but Feet Supported on Floor or Stool  Able to sit safely and securely 2 minutes    Stand to Sit  Sits safely with minimal use of hands    Transfers  Able to transfer safely, minor use of hands    Standing Unsupported with Eyes Closed  Able to stand 10 seconds safely    Standing Unsupported with Feet Together  Able to place feet together independently and stand for 1 minute with supervision    From Standing, Reach Forward with Outstretched Arm  Can reach confidently >25 cm (10")    From Standing Position, Pick up Object from Floor  Able to pick up shoe, needs supervision    From Standing Position, Turn to Look Behind Over each Shoulder  Looks behind one side only/other side shows less weight shift   incr. weight shift to Rt side   Turn 360 Degrees  Able to turn 360 degrees safely but slowly   R= 6.31  L=4.97   Standing Unsupported, Alternately Place Feet on Step/Stool  Able to stand independently and safely and complete 8 steps in 20 seconds   15.06   Standing Unsupported,  One Foot in Front  Able to plae foot ahead of the other independently and hold 30 seconds    Standing on One Leg  Tries to lift leg/unable to hold 3 seconds but remains standing independently    Total Score  47      TUG score 12.62 secs no device  Instructed pt in Rt shoulder AAROM exs with cane - flexion, abduction, internal rotation; ER attempted but pt unable to perform this movement             [facility] Adult PT Treatment/Exercise - 09/02/18 0001      Ambulation/Gait   Gait velocity  12.41= 2.64 ft/sec   2.64 ft/sec with no device            PT Education - 09/02/18 1628    Education Details  wand exercises for Rt shoulder AAROM    Person(s) Educated  Patient    Methods  Explanation;Demonstration;Handout    Comprehension  Verbalized understanding;Returned demonstration       PT Short Term Goals - 09/01/18 1502      PT SHORT TERM GOAL #1   Title  Perform SOT and establish LTG as appropriate based on score/results.    Time  4    Period  Weeks    Status  Deferred    Target Date  08/31/18      PT SHORT TERM GOAL #2   Title  Increase Berg score from 42/56 to >/= 47/56 to decrease fall risk.    Baseline  42/56 ; 47/56 on 09-01-18    Time  4    Period  Weeks    Status  Achieved    Target Date  08/31/18      PT SHORT TERM GOAL #3   Title  Improve TUG score from 26.87 secs to </= 21 secs without device to demo improved mobility with decr. fall risk.    Baseline  26.87 secs;            12.62 secs no device    Time  4    Period  Weeks    Status  Achieved    Target Date  08/31/18      PT SHORT TERM GOAL #4   Title  Increase gait velocity from 2.64 ft/sec to >/= 3.0 ft/sec for incr. gait efficiency.      Baseline  2.64 ft/sec = 12.42 secs ;  12.28    Time  4    Period  Weeks    Status  Not Met  Target Date  08/31/18      PT SHORT TERM GOAL #5   Title  Independent in HEP for balance exercises.    Time  4    Period  Weeks    Status  Achieved    Target  Date  08/31/18        PT Long Term Goals - 09/01/18 1525      PT LONG TERM GOAL #1   Title  Perform Sensory Organization Test and establish goal as appropriate.    Time  8    Period  Weeks    Status  New      PT LONG TERM GOAL #2   Title  Improve TUG score from 26.87 secs without device to </= 18 secs without device to reduce fall risk.    Baseline  26.87 secs no device ; 12.62    Time  8    Period  Weeks    Status  New      PT LONG TERM GOAL #3   Title  Increase gait velocity from 2.64 ft/sec to 3.3 ft/sec without device for incr. gait efficiency.    Baseline  2.64 ft/sec =12.42 secs    Time  8    Period  Weeks    Status  New      PT LONG TERM GOAL #4   Title  Improve DHI score by at least 15 points to demo improvement in dizziness.    Baseline  DHI to be assessed    Time  8    Period  Weeks    Status  New      PT LONG TERM GOAL #5   Title  Independent in updated HEP as appropriate.    Time  8    Period  Weeks    Status  New            Plan - 09/02/18 1630    Clinical Impression Statement  Pt has met STG's #2, 3 and 5:  STG #1 deferred due to assessment that SOT not clinically needed in decision making;  STG #4 not met due to decreased gait velocity.  Pt wishes to be placed on hold until after MD appt with Dr. Jannifer Franklin on 09-26-18.    Personal Factors and Comorbidities  Comorbidity 2;Time since onset of injury/illness/exacerbation;Age    Examination-Activity Limitations  Locomotion Level;Transfers;Stand;Dressing;Bed Mobility;Stairs    Examination-Participation Restrictions  Meal Prep;Cleaning;Community Activity;Shop;Yard Work    [employer]  Evolving/Moderate complexity    Rehab Potential  Good    PT Frequency  2x / week    PT Duration  8 weeks    PT Treatment/Interventions  ADLs/Self Care Home Management;Stair training;Therapeutic activities;Gait training;Balance training;Neuromuscular re-education;Therapeutic exercise;Patient/family  education;Vestibular    PT Next Visit Plan  place pt on hold - give HEP (SLS included)    PT Home Exercise Plan  SLS, balance on foam, heel raises marching in place with EO and EC    Consulted and Agree with Plan of Care  Patient       Patient will benefit from skilled therapeutic intervention in order to improve the following deficits and impairments:  Decreased balance, Dizziness, Difficulty walking, Impaired sensation  Visit Diagnosis: 1. Other abnormalities of gait and mobility   2. Unsteadiness on feet        Problem List Patient Active Problem List   Diagnosis Date Noted  . Gait abnormality 05/23/2018  . Skin cancer of scalp or skin of neck 01/19/2018  .  Abnormal chest x-ray 02/17/2011  . Osteoarthritis 11/18/2010  . Pulmonary embolism ([place]) 08/15/2010  . Chronic anticoagulation 08/15/2010  . Lung nodule 08/15/2010  . Cor pulmonale ([place] ) 08/15/2010  . Dyspnea 07/25/2010  . Glaucoma   . Back pain   . Skin cancer   . Insomnia     , Jenness Corner, PT 09/02/2018, 4:33 PM  [facility] [address] [place], [place], [address] Phone: [phone]   Fax:  [phone]  Name: DEMARKO ZEIMET MRN: [id number] Date of Birth: June 19, 1932

## 2018-09-05 ENCOUNTER — Ambulatory Visit: Payer: Medicare Other | Admitting: Physical Therapy

## 2018-09-08 ENCOUNTER — Ambulatory Visit: Payer: Medicare Other | Admitting: Physical Therapy

## 2018-09-12 ENCOUNTER — Ambulatory Visit: Payer: Medicare Other | Admitting: Physical Therapy

## 2018-09-15 ENCOUNTER — Encounter: Payer: Medicare Other | Admitting: Physical Therapy

## 2018-09-23 ENCOUNTER — Encounter

## 2018-09-26 ENCOUNTER — Ambulatory Visit (INDEPENDENT_AMBULATORY_CARE_PROVIDER_SITE_OTHER): Payer: Medicare Other | Admitting: Neurology

## 2018-09-26 ENCOUNTER — Other Ambulatory Visit: Payer: Self-pay

## 2018-09-26 ENCOUNTER — Encounter: Payer: Self-pay | Admitting: Neurology

## 2018-09-26 VITALS — BP 117/79 | HR 96 | Temp 99.3°F | Ht 70.0 in | Wt 226.0 lb

## 2018-09-26 DIAGNOSIS — R269 Unspecified abnormalities of gait and mobility: Secondary | ICD-10-CM

## 2018-09-26 DIAGNOSIS — R42 Dizziness and giddiness: Secondary | ICD-10-CM | POA: Insufficient documentation

## 2018-09-26 MED ORDER — DIAZEPAM 2 MG PO TABS: 2.0000 mg | ORAL_TABLET | Freq: Two times a day (BID) | ORAL | 1 refills | Status: DC

## 2018-09-26 NOTE — Patient Instructions (Addendum)
We will try diazepam 2 mg twice a day for the dizziness. Stop the medication after 3 weeks if it is not helpful.

## 2018-09-26 NOTE — Progress Notes (Signed)
Reason for visit: Dizziness, gait instability  [name] is an 85 y.o. male  History of present illness:  [name] is an 85 year old right-handed white male with a history of nondescript wooziness or dizziness that is episodic in nature and may occur several times a day lasting 2 to 3 minutes at a time.  The dizziness may occur without any particular head movement, whether he is sitting, standing, or lying down.  He did have some alteration position sense in the feet on physical examination, blood work showed a low normal vitamin B12 level and a low copper level, he is now on supplementation for these issues.  The patient has undergone some physical therapy without much benefit.  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  The patient has not had any falls.  He is on mirtazapine but he does not report brief episodes of "brain shocks", rather the episodes of wooziness last 2 to 3 minutes at a time.  He returns for an evaluation.  Past Medical History:  Diagnosis Date  . Back pain   . DVT (deep venous thrombosis) ([place])   . Gait abnormality 05/23/2018  . Glaucoma   . Hypertension   . Insomnia   . Pulmonary embolism, bilateral [facility]) May 2012   on coumadin  . Skin cancer    forehead  . Skin cancer   . Vertigo     Past Surgical History:  Procedure Laterality Date  . BACK SURGERY     x3  . EXCISION MASS NECK Right 01/19/2018   Procedure: Wide local excision of right neck skin cancer (5 cm);  Surgeon: Melida Quitter, MD;  Location: [facility];  Service: ENT;  Laterality: Right;  . RADICAL NECK DISSECTION Right 01/19/2018    Right modified radical neck dissection; Cervicopectoral flap (Right Neck)  . RADICAL NECK DISSECTION Right 01/19/2018   Procedure: Right modified radical neck dissection; Cervicopectoral flap;  Surgeon: Melida Quitter, MD;  Location: [facility];  Service: ENT;  Laterality: Right;   . SHOULDER SURGERY     x3  Dr. Karrie Doffing al.  . TOTAL HIP ARTHROPLASTY  2012   Dr. Alvan Dame right hip  . TRANSTHORACIC ECHOCARDIOGRAM  07/26/2010   Left ventricle: The cavity size was normal. Wall thickness was increased in a pattern of moderate LVH. Systolic function was normal. The estimated ejection fraction was in the range of 55% to 60%    Family History  Problem Relation Age of Onset  . Stroke Father   . Alzheimer's disease Mother   . Lung cancer Brother     Social history:  reports that he quit smoking about 37 years ago. His smoking use included cigarettes. He has a 25.00 pack-year smoking history. He quit smokeless tobacco use about 10 years ago.  His smokeless tobacco use included chew. He reports current alcohol use. He reports that he does not use drugs.    Allergies  Allergen Reactions  . Codeine Nausea And Vomiting  . Morphine And Related Nausea And Vomiting    Medications:  Prior to Admission medications   Medication Sig Start Date End Date Taking? Authorizing Provider  Ascorbic Acid (VITAMIN C) 1000 MG tablet Take 1,000 mg by mouth daily.     Yes [provider]  aspirin 81 MG tablet Take 81 mg by mouth daily.    Yes [provider]  cholecalciferol (VITAMIN D3)  25 MCG (1000 UT) tablet Take 1,000 Units by mouth daily.    Yes [provider]  Copper Gluconate (COPPER CAPS PO) Take 2 mg by mouth.   Yes [provider]  diphenhydrAMINE (BENADRYL) 50 MG capsule Take 50 mg by mouth daily.   Yes [provider]  GLUCOSAMINE-CHONDROITIN PO Take 1 tablet by mouth daily.   Yes [provider]  L-Arginine 1000 MG TABS Take 1,000 mg by mouth daily.   Yes [provider]  losartan (COZAAR) 100 MG tablet Take 1 tablet (100 mg total) by mouth daily. NEED OV. 10/05/16  Yes Croitoru, Mihai, MD  meclizine (ANTIVERT) 25 MG tablet Take 25 mg by mouth every 6 (six) hours as needed for dizziness.    Yes [provider]   mirtazapine (REMERON) 30 MG tablet Take 30 mg by mouth at bedtime. 01/13/15  Yes [provider]  Red Yeast Rice 600 MG CAPS Take 1,200 mg by mouth daily.    Yes [provider]  Saw Palmetto, Serenoa repens, (SAW PALMETTO PO) Take 1,800 mg by mouth daily.   Yes [provider]  sodium fluoride (PREVIDENT 5000 PLUS) 1.1 % CREA dental cream Apply to tooth brush. Brush teeth for 2 minutes. Spit out excess. DO NOT rinse afterwards. Repeat nightly. 02/14/18  Yes Lenn Cal, DDS  vitamin B-12 (CYANOCOBALAMIN) 1000 MCG tablet Take 1,000 mcg by mouth daily.   Yes [provider]  vitamin E 400 UNIT capsule Take 400 Units by mouth daily.    Yes [provider]    ROS:  Out of a complete 14 system review of symptoms, the patient complains only of the following symptoms, and all other reviewed systems are negative.  Dizziness Gait instability  Blood pressure 117/79, pulse 96, temperature 99.3 F (37.4 C), temperature source Temporal, height 5\' 10"  (1.778 m), weight 226 lb (102.5 kg).  Physical Exam  General: The patient is alert and cooperative at the time of the examination.  Skin: No significant peripheral edema is noted.   Neurologic Exam  Mental status: The patient is alert and oriented x 3 at the time of the examination. The patient has apparent normal recent and remote memory, with an apparently normal attention span and concentration ability.   Cranial nerves: Facial symmetry is present. Speech is normal, no aphasia or dysarthria is noted. Extraocular movements are full. Visual fields are full.  Motor: The patient has good strength in all 4 extremities.  Sensory examination: Soft touch sensation is symmetric on the face, arms, and legs.  Coordination: The patient has good finger-nose-finger and heel-to-shin bilaterally.  Gait and station: The patient has a slightly wide-based gait, the patient is able to ambulate without  assistance.  Tandem gait is unsteady.  Romberg is negative.  Reflexes: Deep tendon reflexes are symmetric.   Assessment/Plan:  1.  Episodic dizziness  2.  Gait instability  The etiology of his dizziness is not clear.  The patient will remain on copper and vitamin B12 supplementation.  He does have some impairment of sensation in the feet but sitting or lying down does not stop the episodes of dizziness.  The dizziness is not postural or related to any particular head position.  Physical therapy has not helped him much.  We will give him a trial on low-dose diazepam.  The patient remains on meclizine, it is not clear if this is helpful or not.  He will follow-up here in 4 months.  If the diazepam  offers no benefit after 3 weeks he will stop the medication.  Jill Alexanders MD 09/26/2018 1:51 PM  [facility] [address] [place], [place] [address]  Phone [phone] Fax [phone]

## 2018-09-27 ENCOUNTER — Telehealth: Payer: Self-pay | Admitting: Neurology

## 2018-09-27 NOTE — Telephone Encounter (Signed)
I called patient and LVM to schedule 4 month follow-up from 7/27 office visit.

## 2018-10-13 ENCOUNTER — Telehealth: Payer: Self-pay | Admitting: *Deleted

## 2018-10-13 ENCOUNTER — Ambulatory Visit: Payer: Medicare Other | Admitting: Physical Therapy

## 2018-10-13 NOTE — Telephone Encounter (Signed)
CALLED PATIENT TO INFORM OF STAT LABS ON 10-17-18, LVM FOR A RETURN CALL

## 2018-10-13 NOTE — Telephone Encounter (Signed)
XXXX 

## 2018-10-17 ENCOUNTER — Ambulatory Visit (HOSPITAL_COMMUNITY)
Admission: RE | Admit: 2018-10-17 | Discharge: 2018-10-17 | Disposition: A | Discharge status: Home / Self Care | Payer: Medicare Other | Source: Ambulatory Visit | Attending: Radiation Oncology | Admitting: Radiation Oncology

## 2018-10-17 ENCOUNTER — Ambulatory Visit
Admission: RE | Admit: 2018-10-17 | Discharge: 2018-10-17 | Disposition: A | Discharge status: Home / Self Care | Payer: Medicare Other | Source: Ambulatory Visit | Attending: Radiation Oncology | Admitting: Radiation Oncology

## 2018-10-17 ENCOUNTER — Other Ambulatory Visit: Payer: Self-pay

## 2018-10-17 DIAGNOSIS — C4440 Unspecified malignant neoplasm of skin of scalp and neck: Secondary | ICD-10-CM | POA: Insufficient documentation

## 2018-10-17 LAB — BUN & CREATININE (CHCC)
BUN: 14 mg/dL (ref 8–23)
Creatinine: 0.96 mg/dL (ref 0.61–1.24)
GFR, Est AFR Am: >60 mL/min (ref >60)
GFR, Estimated: >60 mL/min (ref >60)

## 2018-10-17 MED ORDER — IOHEXOL 300 MG/ML  SOLN
75.0000 mL | Freq: Once | INTRAMUSCULAR | Status: AC | PRN
  Administered 2018-10-17: 75 mL via INTRAVENOUS

## 2018-10-17 MED ORDER — SODIUM CHLORIDE (PF) 0.9 % IJ SOLN
INTRAMUSCULAR | Status: AC
  Filled 2018-10-17: qty 50

## 2018-11-02 ENCOUNTER — Telehealth: Payer: Self-pay | Admitting: *Deleted

## 2018-11-02 ENCOUNTER — Encounter: Payer: Self-pay | Admitting: Radiation Oncology

## 2018-11-02 ENCOUNTER — Other Ambulatory Visit: Payer: Self-pay | Admitting: Radiation Oncology

## 2018-11-02 NOTE — Telephone Encounter (Signed)
CALLED PATIENT TO INFORM OF APPT. WITH DR. BATES ON 11/09/18 ARRIVAL TIME - 10 AM , AND HIS FU WITH DR. Isidore Moos ON 03/10/19 @ 2 PM, PATIENT HAD AN APPT. WITH DR. BATES ON 10-14-18 AND FAILED TO SHOW FOR THIS APPT., SPOKE WITH PATIENT AND HE IS AWARE OF THESE APPTS.

## 2018-11-02 NOTE — Progress Notes (Signed)
Patient called as he had not received results to the CT scan of his neck.  He was supposed to see Dr. Redmond Baseman for a follow-up after the scan to go over the results but for some reason this was not scheduled and the Patient cannot recall why.  In any case the patient reports no new symptoms or skin lesions specifically in the area where he received the radiation and surgery.  He denies any new lumps or bumps.  CT scan of neck is consistent with posttreatment changes and no sign of recurrence or progression; there is some tissue fullness that is hard to characterize. He continues to follow with his dermatologist.  I will arrange for the patient to have a follow-up with Dr. Redmond Baseman in the near future for physical exam and will schedule for the patient to see me for follow-up sometime in January.  Will order CT scans in the future if physical symptoms warrant. Patient is pleased with this plan. -----------------------------------  Eppie Gibson, MD

## 2018-12-27 ENCOUNTER — Other Ambulatory Visit: Payer: Self-pay | Admitting: Neurology

## 2018-12-27 NOTE — Telephone Encounter (Signed)
[facility] Database Verified LR: 11-12-2018 Qty: 60 Pending appointment: No pending appt

## 2018-12-28 ENCOUNTER — Telehealth: Payer: Self-pay

## 2018-12-28 NOTE — Telephone Encounter (Signed)
Pt scheduled for 04/06/2019 at 12 pm check in 1130.

## 2018-12-28 NOTE — Telephone Encounter (Signed)
-----   Message from Kathrynn Ducking, MD sent at 12/27/2018  5:25 PM EDT ----- This patient apparently did not have a RV scheduled, please set up a RV in 3 or 4 months, thanks.

## 2019-01-03 ENCOUNTER — Ambulatory Visit: Payer: Medicare Other | Admitting: Physical Therapy

## 2019-01-03 ENCOUNTER — Other Ambulatory Visit: Payer: Self-pay

## 2019-01-03 ENCOUNTER — Encounter: Payer: Self-pay | Admitting: Physical Therapy

## 2019-01-03 ENCOUNTER — Ambulatory Visit: Payer: Medicare Other | Attending: Otolaryngology | Admitting: Physical Therapy

## 2019-01-03 DIAGNOSIS — R42 Dizziness and giddiness: Secondary | ICD-10-CM | POA: Diagnosis present

## 2019-01-03 DIAGNOSIS — R2689 Other abnormalities of gait and mobility: Secondary | ICD-10-CM | POA: Diagnosis present

## 2019-01-03 DIAGNOSIS — R2681 Unsteadiness on feet: Secondary | ICD-10-CM | POA: Diagnosis present

## 2019-01-03 NOTE — Patient Instructions (Signed)
Standing Marching   Using a chair if necessary, march in place. Repeat 10 times. Do 1 sessions per day.   Hip Backward Kick   Using a chair for balance, keep legs shoulder width apart and toes pointed for- ward. Slowly extend one leg back, keeping knee straight. Do not lean forward. Repeat with other leg. Repeat 10 times. Do 1 sessions per day.  http://gt2.exer.us/340     Hip Side Kick   Holding a chair for balance, keep legs shoulder width apart and toes pointed forward. Swing a leg out to side, keeping knee straight. Do not lean. Repeat using other leg. Repeat 10 times. Do 1 sessions per day.  ALSO DO FORWARD KICKS - 10 TIMES ALTERNATING LEGS -- HOLD FOR COUNTER FOR SUPPORT       Standing On One Leg Without Support .  Stand on one leg in neutral spine without support. Hold 10 seconds. Repeat on other leg. Do 1-2 repetitions, 1 sets.  http://bt.exer.us/36    Side-Stepping   Walk to left side with eyes open. Take even steps, leading with same foot. Make sure each foot lifts off the floor. Repeat in opposite direction. Repeat 3-4 times along counter for support prn.  Do 1 session per day.

## 2019-01-04 NOTE — Therapy (Signed)
[facility] [address] [place], [place], [address] Phone: [phone]   Fax:  [phone]  Physical Therapy Treatment  Patient Details  Name: [name] MRN: [id number] Date of Birth: 02/14/1933 Referring Provider (PT): Dr. Dwight Bates   Encounter Date: 01/03/2019  PT End of Session - 01/04/19 2139    Visit Number  6    Number of Visits  14    Date for PT Re-Evaluation  02/17/19    Authorization Type  UHC Medicare    Authorization Time Period  01-03-19 - 03-05-19    PT Start Time  1530    PT Stop Time  1614    PT Time Calculation (min)  44 min    Activity Tolerance  Patient tolerated treatment well    Behavior During Therapy  WFL for tasks assessed/performed       Past Medical History:  Diagnosis Date  . Back pain   . DVT (deep venous thrombosis) (HCC)   . Gait abnormality 05/23/2018  . Glaucoma   . Hypertension   . Insomnia   . Pulmonary embolism, bilateral (HCC) May 2012   on coumadin  . Skin cancer    forehead  . Skin cancer   . Vertigo     Past Surgical History:  Procedure Laterality Date  . BACK SURGERY     x3  . EXCISION MASS NECK Right 01/19/2018   Procedure: Wide local excision of right neck skin cancer (5 cm);  Surgeon: Bates, Dwight, MD;  Location: [facility];  Service: ENT;  Laterality: Right;  . RADICAL NECK DISSECTION Right 01/19/2018    Right modified radical neck dissection; Cervicopectoral flap (Right Neck)  . RADICAL NECK DISSECTION Right 01/19/2018   Procedure: Right modified radical neck dissection; Cervicopectoral flap;  Surgeon: Bates, Dwight, MD;  Location: [facility];  Service: ENT;  Laterality: Right;  . SHOULDER SURGERY     x3  Dr. Nitka et al.  . TOTAL HIP ARTHROPLASTY  2012   Dr. Olin right hip  . TRANSTHORACIC ECHOCARDIOGRAM  07/26/2010   Left ventricle: The cavity size was normal. Wall thickness was increased in a pattern of moderate LVH. Systolic function was normal. The  estimated ejection fraction was in the range of 55% to 60%    There were no vitals filed for this visit.  Subjective Assessment - 01/03/19 1532    Subjective  Pt reports no major changes in dizziness - last PT appt was on 09-01-18 - pt states he has been trying to get back into PT for a while    Patient Stated Goals  "end the dizziness and the balance problem"    Currently in Pain?  No/denies                       [facility] Adult PT Treatment/Exercise - 01/04/19 0001      Transfers   Transfers  Sit to Stand    Number of Reps  Other reps (comment)   3   Comments  no UE support from mat table      Ambulation/Gait   Gait velocity  15.56 secs = 2.11 ft/sec with no device      Standardized Balance Assessment   Standardized Balance Assessment  Timed Up and Go Test      Timed Up and Go Test   TUG  Normal TUG    Normal TUG (seconds)  16.13       Pt performed   standing balance exercises for HEP at counter with UE support prn for safety - see pt instructions for details      PT Education - 01/04/19 2139    Education Details  pt instructed in balance HEP along counter for safety    Person(s) Educated  Patient    Methods  Explanation;Demonstration;Handout    Comprehension  Verbalized understanding;Returned demonstration       PT Short Term Goals - 01/04/19 2144      PT SHORT TERM GOAL #1   Title  Perform SOT and establish LTG as appropriate based on score/results.    Time  4    Period  Weeks    Status  Deferred    Target Date  08/31/18      PT SHORT TERM GOAL #2   Title  Increase Berg score from 42/56 to >/= 47/56 to decrease fall risk.    Baseline  42/56 ; 47/56 on 09-01-18    Time  4    Period  Weeks    Status  Achieved    Target Date  08/31/18      PT SHORT TERM GOAL #3   Title  Improve TUG score from 26.87 secs to </= 21 secs without device to demo improved mobility with decr. fall risk.    Baseline  26.87 secs;            12.62 secs no device    Time   4    Period  Weeks    Status  Achieved    Target Date  08/31/18      PT SHORT TERM GOAL #4   Title  Increase gait velocity from 2.64 ft/sec to >/= 3.0 ft/sec for incr. gait efficiency.      Baseline  2.64 ft/sec = 12.42 secs ;  12.28    Time  4    Period  Weeks    Status  Not Met    Target Date  08/31/18      PT SHORT TERM GOAL #5   Title  Independent in HEP for balance exercises.    Time  4    Period  Weeks    Status  Achieved    Target Date  08/31/18        PT Long Term Goals - 01/03/19 1533      PT LONG TERM GOAL #1   Title  Perform Sensory Organization Test and establish goal as appropriate.    Time  8    Period  Weeks    Status  Deferred      PT LONG TERM GOAL #2   Title  Improve TUG score from 26.87 secs without device to </= 18 secs without device to reduce fall risk. UPGRADED LTG = TUG score </= 14.0 secs with no device    Baseline  26.87 secs no device ; 12.62;       met - 16.13 secs on 01-03-19    Time  8    Period  Weeks    Status  Achieved    Target Date  02/17/19      PT LONG TERM GOAL #3   Title  Increase gait velocity from 2.64 ft/sec to 3.3 ft/sec without device for incr. gait efficiency. REVISED LTG - gait velocity >/= 2.7 ft/sec without device    Baseline  2.64 ft/sec =12.42 secs     15.56 secs on 01-03-19 = 2.11 ft/sec on 01-03-19    Time  8      Period  Weeks    Status  Revised    Target Date  02/17/19      PT LONG TERM GOAL #4   Title  Improve DHI score by at least 15 points to demo improvement in dizziness.    Baseline  DHI to be assessed    Time  8    Period  Weeks    Status  Deferred      PT LONG TERM GOAL #5   Title  Independent in updated HEP as appropriate.    Time  8    Period  Weeks    Status  On-going    Target Date  02/17/19            Plan - 01/04/19 2141    Clinical Impression Statement  Pt has met LTG #2 with goals #1 and 4 deferred due to persistent c/o dizziness; pt has not attended PT since early July 2020 due to  pt's request of being placed on hold at time of previous visit due to chronic c/o vertigo with pt reporting minimal progress and improvement with PT.  Pt requests to resume PT at this time to address gait and balance impairments.    Personal Factors and Comorbidities  Comorbidity 2;Time since onset of injury/illness/exacerbation;Age    Examination-Activity Limitations  Locomotion Level;Transfers;Stand;Dressing;Bed Mobility;Stairs    Examination-Participation Restrictions  Meal Prep;Cleaning;Community Activity;Shop;Yard Work    Stability/Clinical Decision Making  Evolving/Moderate complexity    Rehab Potential  Good    PT Frequency  2x / week    PT Duration  8 weeks    PT Treatment/Interventions  ADLs/Self Care Home Management;Stair training;Therapeutic activities;Gait training;Balance training;Neuromuscular re-education;Therapeutic exercise;Patient/family education;Vestibular    PT Next Visit Plan  place pt on hold - give HEP (SLS included)    PT Home Exercise Plan  SLS, balance on foam, heel raises marching in place with EO and EC    Consulted and Agree with Plan of Care  Patient       Patient will benefit from skilled therapeutic intervention in order to improve the following deficits and impairments:  Decreased balance, Dizziness, Difficulty walking, Impaired sensation  Visit Diagnosis: Other abnormalities of gait and mobility - Plan: PT plan of care cert/re-cert  Unsteadiness on feet - Plan: PT plan of care cert/re-cert  Dizziness and giddiness - Plan: PT plan of care cert/re-cert     Problem List Patient Active Problem List   Diagnosis Date Noted  . Dizziness 09/26/2018  . Gait abnormality 05/23/2018  . Skin cancer of scalp or skin of neck 01/19/2018  . Abnormal chest x-ray 02/17/2011  . Osteoarthritis 11/18/2010  . Pulmonary embolism (HCC) 08/15/2010  . Chronic anticoagulation 08/15/2010  . Lung nodule 08/15/2010  . Cor pulmonale (HCC) 08/15/2010  . Dyspnea 07/25/2010  .  Glaucoma   . Back pain   . Skin cancer   . Insomnia     ,  Suzanne, PT 01/04/2019, 9:50 PM  [facility] [address] [place], [place], [address] Phone: [phone]   Fax:  [phone]  Name: [name] MRN: [id number] Date of Birth: 01/12/1933   

## 2019-01-17 ENCOUNTER — Ambulatory Visit: Payer: Self-pay | Admitting: Radiation Oncology

## 2019-01-31 ENCOUNTER — Ambulatory Visit: Payer: Medicare Other | Attending: Otolaryngology | Admitting: Physical Therapy

## 2019-01-31 DIAGNOSIS — R42 Dizziness and giddiness: Secondary | ICD-10-CM | POA: Insufficient documentation

## 2019-01-31 DIAGNOSIS — R2689 Other abnormalities of gait and mobility: Secondary | ICD-10-CM | POA: Insufficient documentation

## 2019-01-31 DIAGNOSIS — R2681 Unsteadiness on feet: Secondary | ICD-10-CM | POA: Insufficient documentation

## 2019-02-07 ENCOUNTER — Ambulatory Visit: Payer: Medicare Other | Admitting: Physical Therapy

## 2019-02-07 ENCOUNTER — Other Ambulatory Visit: Payer: Self-pay

## 2019-02-07 DIAGNOSIS — R42 Dizziness and giddiness: Secondary | ICD-10-CM | POA: Diagnosis present

## 2019-02-07 DIAGNOSIS — R2689 Other abnormalities of gait and mobility: Secondary | ICD-10-CM | POA: Diagnosis not present

## 2019-02-07 DIAGNOSIS — R2681 Unsteadiness on feet: Secondary | ICD-10-CM

## 2019-02-08 ENCOUNTER — Encounter: Payer: Self-pay | Admitting: Physical Therapy

## 2019-02-08 NOTE — Therapy (Signed)
[facility] [address] [place], [place], [address] Phone: [phone]   Fax:  [phone]  Physical Therapy Treatment  Patient Details  Name: [name] MRN: [id number] Date of Birth: [birth year] Referring Provider (PT): Dr. Melida Quitter   Encounter Date: 02/07/2019  PT End of Session - 02/08/19 2112    Visit Number  7    Number of Visits  14    Date for PT Re-Evaluation  02/17/19    Authorization Type  UHC Medicare    Authorization Time Period  01-03-19 - 03-05-19    PT Start Time  1533    PT Stop Time  1620    PT Time Calculation (min)  47 min    Activity Tolerance  Patient tolerated treatment well    Behavior During Therapy  [facility] for tasks assessed/performed       Past Medical History:  Diagnosis Date  . Back pain   . DVT (deep venous thrombosis) ([place])   . Gait abnormality 05/23/2018  . Glaucoma   . Hypertension   . Insomnia   . Pulmonary embolism, bilateral [facility]) May 2012   on coumadin  . Skin cancer    forehead  . Skin cancer   . Vertigo     Past Surgical History:  Procedure Laterality Date  . BACK SURGERY     x3  . EXCISION MASS NECK Right 01/19/2018   Procedure: Wide local excision of right neck skin cancer (5 cm);  Surgeon: Melida Quitter, MD;  Location: [facility];  Service: ENT;  Laterality: Right;  . RADICAL NECK DISSECTION Right 01/19/2018    Right modified radical neck dissection; Cervicopectoral flap (Right Neck)  . RADICAL NECK DISSECTION Right 01/19/2018   Procedure: Right modified radical neck dissection; Cervicopectoral flap;  Surgeon: Melida Quitter, MD;  Location: [facility];  Service: ENT;  Laterality: Right;  . SHOULDER SURGERY     x3  Dr. Karrie Doffing al.  . TOTAL HIP ARTHROPLASTY  2012   Dr. Alvan Dame right hip  . TRANSTHORACIC ECHOCARDIOGRAM  07/26/2010   Left ventricle: The cavity size was normal. Wall thickness was increased in a pattern of moderate LVH. Systolic function was normal. The  estimated ejection fraction was in the range of 55% to 60%    There were no vitals filed for this visit.  Subjective Assessment - 02/07/19 1538    Subjective  Pt reports no major change in dizziness; pt states he uses cane in the house for assistance with ambulation    Patient Stated Goals  "end the dizziness and the balance problem"    Currently in Pain?  No/denies                       [facility] Adult PT Treatment/Exercise - 02/08/19 0001      Transfers   Transfers  Sit to Stand    Number of Reps  Other reps (comment)   5   Comments  no UE support from mat table      Ambulation/Gait   Ambulation/Gait  Yes    Ambulation/Gait Assistance  4: Min guard    Ambulation Distance (Feet)  100 Feet    Assistive device  None    Gait Pattern  Festinating;Trunk flexed;Decreased step length - right;Decreased step length - left   decreased heel strike bilateral LE's in stance         Balance Exercises - 02/08/19 2109      Balance  Exercises: Standing   Stepping Strategy  Anterior;Lateral;5 reps    Sidestepping  2 reps    Step Over Hurdles / Cones  stepping over balance beam 5 reps each foot with UE support     Other Standing Exercises  Pt performed standing forward, back and side kicks 10 reps each leg alternating with UE support; marching in place 10 reps each foot in place;           PT Short Term Goals - 02/08/19 2118      PT SHORT TERM GOAL #1   Title  Perform SOT and establish LTG as appropriate based on score/results.    Time  4    Period  Weeks    Status  Deferred    Target Date  08/31/18      PT SHORT TERM GOAL #2   Title  Increase Berg score from 42/56 to >/= 47/56 to decrease fall risk.    Baseline  42/56 ; 47/56 on 09-01-18    Time  4    Period  Weeks    Status  Achieved    Target Date  08/31/18      PT SHORT TERM GOAL #3   Title  Improve TUG score from 26.87 secs to </= 21 secs without device to demo improved mobility with decr. fall risk.     Baseline  26.87 secs;            12.62 secs no device    Time  4    Period  Weeks    Status  Achieved    Target Date  08/31/18      PT SHORT TERM GOAL #4   Title  Increase gait velocity from 2.64 ft/sec to >/= 3.0 ft/sec for incr. gait efficiency.      Baseline  2.64 ft/sec = 12.42 secs ;  12.28    Time  4    Period  Weeks    Status  Not Met    Target Date  08/31/18      PT SHORT TERM GOAL #5   Title  Independent in HEP for balance exercises.    Time  4    Period  Weeks    Status  Achieved    Target Date  08/31/18        PT Long Term Goals - 02/08/19 2118      PT LONG TERM GOAL #1   Title  Perform Sensory Organization Test and establish goal as appropriate.    Time  8    Period  Weeks    Status  Deferred      PT LONG TERM GOAL #2   Title  Improve TUG score from 26.87 secs without device to </= 18 secs without device to reduce fall risk. UPGRADED LTG = TUG score </= 14.0 secs with no device    Baseline  26.87 secs no device ; 12.62;       met - 16.13 secs on 01-03-19    Time  8    Period  Weeks    Status  Achieved      PT LONG TERM GOAL #3   Title  Increase gait velocity from 2.64 ft/sec to 3.3 ft/sec without device for incr. gait efficiency. REVISED LTG - gait velocity >/= 2.7 ft/sec without device    Baseline  2.64 ft/sec =12.42 secs     15.56 secs on 01-03-19 = 2.11 ft/sec on 01-03-19    Time  8  Period  Weeks    Status  Revised      PT LONG TERM GOAL #4   Title  Improve DHI score by at least 15 points to demo improvement in dizziness.    Baseline  DHI to be assessed    Time  8    Period  Weeks    Status  Deferred      PT LONG TERM GOAL #5   Title  Independent in updated HEP as appropriate.    Time  8    Period  Weeks    Status  On-going            Plan - 02/08/19 2112    Clinical Impression Statement  Pt's gait pattern characteristic of Parkinsonism as pt gains momentum with decreased step length and decr. heel contact and festinating gait with  forward flexion of trunk; pt did need min assist x 2 occurrences to stop to prevent LOB anteriorly.  Pt's c/o dizziness appears to be of central etiology.    Personal Factors and Comorbidities  Comorbidity 2;Time since onset of injury/illness/exacerbation;Age    Examination-Activity Limitations  Locomotion Level;Transfers;Stand;Dressing;Bed Mobility;Stairs    Examination-Participation Restrictions  Meal Prep;Cleaning;Community Activity;Shop;Yard Work    [employer] officer  Evolving/Moderate complexity    Rehab Potential  Good    PT Frequency  2x / week    PT Duration  8 weeks    PT Treatment/Interventions  ADLs/Self Care Home Management;Stair training;Therapeutic activities;Gait training;Balance training;Neuromuscular re-education;Therapeutic exercise;Patient/family education;Vestibular    PT Next Visit Plan  place pt on hold - give HEP (SLS included)    PT Home Exercise Plan  SLS, balance on foam, heel raises marching in place with EO and EC    Consulted and Agree with Plan of Care  Patient       Patient will benefit from skilled therapeutic intervention in order to improve the following deficits and impairments:  Decreased balance, Dizziness, Difficulty walking, Impaired sensation  Visit Diagnosis: Other abnormalities of gait and mobility  Unsteadiness on feet  Dizziness and giddiness     Problem List Patient Active Problem List   Diagnosis Date Noted  . Dizziness 09/26/2018  . Gait abnormality 05/23/2018  . Skin cancer of scalp or skin of neck 01/19/2018  . Abnormal chest x-ray 02/17/2011  . Osteoarthritis 11/18/2010  . Pulmonary embolism ([facility]) 08/15/2010  . Chronic anticoagulation 08/15/2010  . Lung nodule 08/15/2010  . Cor pulmonale ([place]) 08/15/2010  . Dyspnea 07/25/2010  . Glaucoma   . Back pain   . Skin cancer   . Insomnia     Cletis Clack, [place], PT 02/08/2019, 9:19 PM  [facility] [address] [place], [place], [address] Phone: [phone]   Fax:  [phone]  Name: [name] MRN: [id number] Date of Birth: 1932-07-04

## 2019-02-09 ENCOUNTER — Other Ambulatory Visit: Payer: Self-pay

## 2019-02-09 ENCOUNTER — Encounter: Payer: Self-pay | Admitting: Physical Therapy

## 2019-02-09 ENCOUNTER — Ambulatory Visit: Payer: Medicare Other | Admitting: Physical Therapy

## 2019-02-09 DIAGNOSIS — R2681 Unsteadiness on feet: Secondary | ICD-10-CM

## 2019-02-09 DIAGNOSIS — R2689 Other abnormalities of gait and mobility: Secondary | ICD-10-CM

## 2019-02-10 NOTE — Therapy (Signed)
[facility] [address] [place], [place], [address] Phone: [phone]   Fax:  [phone]  Physical Therapy Treatment  Patient Details  Name: [name] MRN: [id number] Date of Birth: 03/26/1932 Referring Provider (PT): Dr. Melida Quitter   Encounter Date: 02/09/2019  PT End of Session - 02/10/19 2224    Visit Number  8    Number of Visits  14    Date for PT Re-Evaluation  02/17/19    Authorization Type  UHC Medicare    Authorization Time Period  01-03-19 - 03-05-19    PT Start Time  1232    PT Stop Time  1317    PT Time Calculation (min)  45 min    Equipment Utilized During Treatment  Gait belt    Activity Tolerance  Patient tolerated treatment well    Behavior During Therapy  [facility] for tasks assessed/performed       Past Medical History:  Diagnosis Date  . Back pain   . DVT (deep venous thrombosis) ([place])   . Gait abnormality 05/23/2018  . Glaucoma   . Hypertension   . Insomnia   . Pulmonary embolism, bilateral [facility]) May 2012   on coumadin  . Skin cancer    forehead  . Skin cancer   . Vertigo     Past Surgical History:  Procedure Laterality Date  . BACK SURGERY     x3  . EXCISION MASS NECK Right 01/19/2018   Procedure: Wide local excision of right neck skin cancer (5 cm);  Surgeon: Melida Quitter, MD;  Location: [facility];  Service: ENT;  Laterality: Right;  . RADICAL NECK DISSECTION Right 01/19/2018    Right modified radical neck dissection; Cervicopectoral flap (Right Neck)  . RADICAL NECK DISSECTION Right 01/19/2018   Procedure: Right modified radical neck dissection; Cervicopectoral flap;  Surgeon: Melida Quitter, MD;  Location: [facility];  Service: ENT;  Laterality: Right;  . SHOULDER SURGERY     x3  Dr. Karrie Doffing al.  . TOTAL HIP ARTHROPLASTY  2012   Dr. Alvan Dame right hip  . TRANSTHORACIC ECHOCARDIOGRAM  07/26/2010   Left ventricle: The cavity size was normal. Wall thickness was increased in a pattern of  moderate LVH. Systolic function was normal. The estimated ejection fraction was in the range of 55% to 60%    There were no vitals filed for this visit.  Subjective Assessment - 02/10/19 2213    Subjective  Pt states he didn't really feel like coming today - "just felt tired"    Patient Stated Goals  "end the dizziness and the balance problem"    Currently in Pain?  No/denies                       [facility] Adult PT Treatment/Exercise - 02/10/19 0001      Transfers   Transfers  Sit to Stand    Number of Reps  Other reps (comment)   5   Comments  no UE support from mat table      Ambulation/Gait   Ambulation/Gait  Yes    Ambulation/Gait Assistance  4: Min guard    Ambulation Distance (Feet)  230 Feet    Assistive device  None    Gait Pattern  Festinating;Trunk flexed;Decreased step length - right;Decreased step length - left   decreased heel strike bilateral LE's in stance     Neuro Re-ed    Neuro Re-ed Details   pt  negotiated ladder on floor - step over step sequence with min assist  to fascillitate step over step sequence - min assist for safety      Exercises   Exercises  Knee/Hip      Knee/Hip Exercises: Stretches   Active Hamstring Stretch  Both;1 rep;30 seconds      Knee/Hip Exercises: Aerobic   Recumbent Bike  SciFit level 5.0 x 5" with UE's & LE's      Knee/Hip Exercises: Standing   Forward Step Up  Both;1 set;10 reps;Hand Hold: 1;Step Height: 6"          Balance Exercises - 02/10/19 2218      Balance Exercises: Standing   Stepping Strategy  Anterior;Lateral;5 reps;Posterior   5 reps each leg    Step Over Hurdles / Cones  stepping over balance beam 5 reps each foot with UE support     Other Standing Exercises  tap ups to 1st step 5 reps each foot; 5 reps each foot to 2nd step with 1 UE support on hand rail at steps           PT Short Term Goals - 02/10/19 2230      PT SHORT TERM GOAL #1   Title  Perform SOT and establish LTG as  appropriate based on score/results.    Time  4    Period  Weeks    Status  Deferred    Target Date  08/31/18      PT SHORT TERM GOAL #2   Title  Increase Berg score from 42/56 to >/= 47/56 to decrease fall risk.    Baseline  42/56 ; 47/56 on 09-01-18    Time  4    Period  Weeks    Status  Achieved    Target Date  08/31/18      PT SHORT TERM GOAL #3   Title  Improve TUG score from 26.87 secs to </= 21 secs without device to demo improved mobility with decr. fall risk.    Baseline  26.87 secs;            12.62 secs no device    Time  4    Period  Weeks    Status  Achieved    Target Date  08/31/18      PT SHORT TERM GOAL #4   Title  Increase gait velocity from 2.64 ft/sec to >/= 3.0 ft/sec for incr. gait efficiency.      Baseline  2.64 ft/sec = 12.42 secs ;  12.28    Time  4    Period  Weeks    Status  Not Met    Target Date  08/31/18      PT SHORT TERM GOAL #5   Title  Independent in HEP for balance exercises.    Time  4    Period  Weeks    Status  Achieved    Target Date  08/31/18        PT Long Term Goals - 02/10/19 2230      PT LONG TERM GOAL #1   Title  Perform Sensory Organization Test and establish goal as appropriate.    Time  8    Period  Weeks    Status  Deferred      PT LONG TERM GOAL #2   Title  Improve TUG score from 26.87 secs without device to </= 18 secs without device to reduce fall risk. UPGRADED LTG = TUG score </= 14.0  secs with no device    Baseline  26.87 secs no device ; 12.62;       met - 16.13 secs on 01-03-19    Time  8    Period  Weeks    Status  Achieved      PT LONG TERM GOAL #3   Title  Increase gait velocity from 2.64 ft/sec to 3.3 ft/sec without device for incr. gait efficiency. REVISED LTG - gait velocity >/= 2.7 ft/sec without device    Baseline  2.64 ft/sec =12.42 secs     15.56 secs on 01-03-19 = 2.11 ft/sec on 01-03-19    Time  8    Period  Weeks    Status  Revised      PT LONG TERM GOAL #4   Title  Improve DHI score by at  least 15 points to demo improvement in dizziness.    Baseline  DHI to be assessed    Time  8    Period  Weeks    Status  Deferred      PT LONG TERM GOAL #5   Title  Independent in updated HEP as appropriate.    Time  8    Period  Weeks    Status  On-going            Plan - 02/10/19 2225    Clinical Impression Statement  Pt continues to exhibit gait deviations characteristic of Parkinson's including festinating gait with gradually increasing trunk flexion and increased speed after amb. approx 51' - pt needs cues to stop and reduce/correct posture and restart. Pt cont to have decreased standing balance, i.e. decr. SLS on each leg - pt reports Lt hip pain due to OA at times which impacts LLE SLS.    Personal Factors and Comorbidities  Comorbidity 2;Time since onset of injury/illness/exacerbation;Age    Examination-Activity Limitations  Locomotion Level;Transfers;Stand;Dressing;Bed Mobility;Stairs    Examination-Participation Restrictions  Meal Prep;Cleaning;Community Activity;Shop;Yard Work    [employer] officer  Evolving/Moderate complexity    Rehab Potential  Good    PT Frequency  2x / week    PT Duration  8 weeks    PT Treatment/Interventions  ADLs/Self Care Home Management;Stair training;Therapeutic activities;Gait training;Balance training;Neuromuscular re-education;Therapeutic exercise;Patient/family education;Vestibular    PT Next Visit Plan  cont balance and gait training    PT Home Exercise Plan  SLS, balance on foam, heel raises marching in place with EO and EC    Consulted and Agree with Plan of Care  Patient       Patient will benefit from skilled therapeutic intervention in order to improve the following deficits and impairments:  Decreased balance, Dizziness, Difficulty walking, Impaired sensation  Visit Diagnosis: Other abnormalities of gait and mobility  Unsteadiness on feet     Problem List Patient Active Problem List   Diagnosis Date Noted   . Dizziness 09/26/2018  . Gait abnormality 05/23/2018  . Skin cancer of scalp or skin of neck 01/19/2018  . Abnormal chest x-ray 02/17/2011  . Osteoarthritis 11/18/2010  . Pulmonary embolism ([place]) 08/15/2010  . Chronic anticoagulation 08/15/2010  . Lung nodule 08/15/2010  . Cor pulmonale ([place]) 08/15/2010  . Dyspnea 07/25/2010  . Glaucoma   . Back pain   . Skin cancer   . Insomnia     , Jenness Corner, PT 02/10/2019, 10:34 PM  [facility] [address] [place], [place], [address] Phone: [phone]   Fax:  [phone]  Name: Gregory Schnepf  Simon MRN: [id number] Date of Birth: 1932-10-19

## 2019-03-09 ENCOUNTER — Ambulatory Visit: Payer: Medicare Other | Admitting: Physical Therapy

## 2019-03-09 ENCOUNTER — Telehealth: Payer: Self-pay | Admitting: *Deleted

## 2019-03-09 NOTE — Progress Notes (Signed)
I called the patient today about their upcoming follow-up appointment in radiation oncology.   Given the state of the  COVID-19 pandemic, concerning case numbers in our community, and guidance from [facility], I offered a phone assessment with the patient to determine if coming to the clinic was necessary. The patient accepted.  I let the patient know that I had spoken with Dr. Isidore Moos, and she wanted them to know the importance of washing their hands for at least 20 seconds at a time, especially after going out in public, and before they eat. Limit going out in public whenever possible. Do not touch your face, unless your hands are clean, such as when bathing. Get plenty of rest, eat well, and stay hydrated.   Symptomatically, the patient is doing relatively well. They report no concerns related to radiation treatment.   All questions were answered to the patient's satisfaction.  I encouraged the patient to call with any further questions. Otherwise, the plan is for him to see Dr. Redmond Baseman in February and return to see Dr. Isidore Moos in April.    Patient is pleased with this plan, and we will cancel their upcoming follow-up to reduce the risk of COVID-19 transmission.  I also advised him that Dr. Isidore Moos recommends he receive the COVID-19 vaccine if his PCP agrees. I provided him with the number to the [facility] to set up an appointment to receive the vaccine. He was appreciative of the phone call and knows to call me with any further questions or concerns.

## 2019-03-09 NOTE — Telephone Encounter (Signed)
CALLED PATIENT TO ALTER FU APPT. PER DR. SQUIRE REQUEST, FU APPT. MOVED TO 06-16-19 @ 2:20 PM, PATIENT AGREED TO NEW DATE AND TIME

## 2019-03-10 ENCOUNTER — Inpatient Hospital Stay
Admission: RE | Admit: 2019-03-10 | Discharge: 2019-03-10 | Disposition: A | Discharge status: Home / Self Care | Payer: Medicare Other | Source: Ambulatory Visit | Attending: Radiation Oncology | Admitting: Radiation Oncology

## 2019-03-14 ENCOUNTER — Ambulatory Visit: Payer: Medicare Other | Attending: Otolaryngology | Admitting: Physical Therapy

## 2019-03-14 ENCOUNTER — Other Ambulatory Visit: Payer: Self-pay

## 2019-03-14 DIAGNOSIS — R42 Dizziness and giddiness: Secondary | ICD-10-CM

## 2019-03-14 DIAGNOSIS — R2689 Other abnormalities of gait and mobility: Secondary | ICD-10-CM | POA: Insufficient documentation

## 2019-03-14 DIAGNOSIS — R2681 Unsteadiness on feet: Secondary | ICD-10-CM

## 2019-03-15 ENCOUNTER — Encounter: Payer: Self-pay | Admitting: Physical Therapy

## 2019-03-15 NOTE — Therapy (Signed)
[facility] [address] [place], [place], [address] Phone: [phone]   Fax:  [phone]  Physical Therapy Treatment  Patient Details  Name: [name] MRN: [id number] Date of Birth: [birth year] Referring Provider (PT): Dr. Melida Quitter   Encounter Date: 03/14/2019  PT End of Session - 03/15/19 2031    Visit Number  9    Number of Visits  14    Date for PT Re-Evaluation  02/17/19    Authorization Type  [employer]HC Medicare    Authorization Time Period  01-03-19 - 03-05-19; 03-14-19 - 05-12-19    PT Start Time  1515    PT Stop Time  1600    PT Time Calculation (min)  45 min    Activity Tolerance  Patient tolerated treatment well    Behavior During Therapy  [facility] for tasks assessed/performed       Past Medical History:  Diagnosis Date  . Back pain   . DVT (deep venous thrombosis) ([place])   . Gait abnormality 05/23/2018  . Glaucoma   . Hypertension   . Insomnia   . Pulmonary embolism, bilateral [facility]) May 2012   on coumadin  . Skin cancer    forehead  . Skin cancer   . Vertigo     Past Surgical History:  Procedure Laterality Date  . BACK SURGERY     x3  . EXCISION MASS NECK Right 01/19/2018   Procedure: Wide local excision of right neck skin cancer (5 cm);  Surgeon: Melida Quitter, MD;  Location: [facility];  Service: ENT;  Laterality: Right;  . RADICAL NECK DISSECTION Right 01/19/2018    Right modified radical neck dissection; Cervicopectoral flap (Right Neck)  . RADICAL NECK DISSECTION Right 01/19/2018   Procedure: Right modified radical neck dissection; Cervicopectoral flap;  Surgeon: Melida Quitter, MD;  Location: [facility];  Service: ENT;  Laterality: Right;  . SHOULDER SURGERY     x3  Dr. Karrie Doffing al.  . TOTAL HIP ARTHROPLASTY  2012   Dr. Alvan Dame right hip  . TRANSTHORACIC ECHOCARDIOGRAM  07/26/2010   Left ventricle: The cavity size was normal. Wall thickness was increased in a pattern of moderate LVH. Systolic function was  normal. The estimated ejection fraction was in the range of 55% to 60%    There were no vitals filed for this visit.  Subjective Assessment - 03/14/19 1520    Subjective  Pt arrived 28" late - reports he had urgency at home that delayed him in leaving:  pt states he did not sleep well last night - is not doing real well today    Patient Stated Goals  "end the dizziness and the balance problem"    Currently in Pain?  No/denies                       [facility] Adult PT Treatment/Exercise - 03/15/19 0001      Ambulation/Gait   Ambulation/Gait  Yes    Ambulation/Gait Assistance  4: Min guard    Ambulation Distance (Feet)  115 Feet    Assistive device  Rolling walker    Gait Pattern  Festinating;Trunk flexed;Decreased step length - right;Decreased step length - left   decreased heel strike bilateral LE's in stance   Ambulation Surface  Level;Indoor    Gait Comments  rollator was trialed for approx. 46' but pt did not like this RW due to easy maneuverability and rolling too far in front of him  Exercises   Exercises  Shoulder      Shoulder Exercises: Stretch   Corner Stretch  1 rep;20 seconds    Wall Stretch - Flexion  1 rep;20 seconds    Other Shoulder Stretches  shoulder blade squeezes 10 reps 3 sec hold        PWR [facility]) - 03/15/19 2026    PWR! Rock  5 reps to each side - weight shift with reaching up to Rt/Lt sides at counter      Self Care - discussed pt's functional status with decline in gait and balance noted with some symptoms of parkinsonism noted Discussed need for RW - will request order from Dr. Jannifer Franklin   PT Education - 03/15/19 2030    Education Details  discussed need for RW at this time due to progression of gait disorder and pt's increased fall risk - pt agreed for order for RW to be requested from Dr. Elta Guadeloupe) Educated  Patient    Methods  Explanation    Comprehension  Verbalized understanding       PT Short Term Goals - 02/10/19  2230      PT SHORT TERM GOAL #1   Title  Perform SOT and establish LTG as appropriate based on score/results.    Time  4    Period  Weeks    Status  Deferred    Target Date  08/31/18      PT SHORT TERM GOAL #2   Title  Increase Berg score from 42/56 to >/= 47/56 to decrease fall risk.    Baseline  42/56 ; 47/56 on 09-01-18    Time  4    Period  Weeks    Status  Achieved    Target Date  08/31/18      PT SHORT TERM GOAL #3   Title  Improve TUG score from 26.87 secs to </= 21 secs without device to demo improved mobility with decr. fall risk.    Baseline  26.87 secs;            12.62 secs no device    Time  4    Period  Weeks    Status  Achieved    Target Date  08/31/18      PT SHORT TERM GOAL #4   Title  Increase gait velocity from 2.64 ft/sec to >/= 3.0 ft/sec for incr. gait efficiency.      Baseline  2.64 ft/sec = 12.42 secs ;  12.28    Time  4    Period  Weeks    Status  Not Met    Target Date  08/31/18      PT SHORT TERM GOAL #5   Title  Independent in HEP for balance exercises.    Time  4    Period  Weeks    Status  Achieved    Target Date  08/31/18        PT Long Term Goals - 03/15/19 2040      PT LONG TERM GOAL #1   Title  Perform Sensory Organization Test and establish goal as appropriate.    Time  8    Period  Weeks    Status  Deferred      PT LONG TERM GOAL #2   Title  Improve TUG score from 26.87 secs without device to </= 18 secs without device to reduce fall risk. UPGRADED LTG = TUG score </= 14.0 secs with no device  Baseline  26.87 secs no device ; 12.62;       met - 16.13 secs on 01-03-19    Time  8    Period  Weeks    Status  Achieved      PT LONG TERM GOAL #3   Title  Increase gait velocity from 2.64 ft/sec to 3.3 ft/sec without device for incr. gait efficiency. REVISED LTG - gait velocity >/= 2.7 ft/sec without device    Baseline  2.64 ft/sec =12.42 secs     15.56 secs on 01-03-19 = 2.11 ft/sec on 01-03-19    Time  8    Period  Weeks     Status  Revised      PT LONG TERM GOAL #4   Title  Improve DHI score by at least 15 points to demo improvement in dizziness.    Baseline  DHI to be assessed    Time  8    Period  Weeks    Status  Deferred      PT LONG TERM GOAL #5   Title  Independent in updated HEP as appropriate.    Time  8    Period  Weeks    Status  On-going            Plan - 03/15/19 2033    Clinical Impression Statement  This 9th visit progress note covers dates 08-01-18 - 03-14-19;  pt's gait and balance are progressively getting worse with more gait deviations noted, characteristic of parkinsonism with shuffling gait pattern, increased trunk flexion with pt reporting that he occasionally gains momentum during gait and finds it difficult to stop moving.  Pt is now using [facility] for assistance with ambulation but needs to be using RW for stability and for reduced fall risk.    Personal Factors and Comorbidities  Comorbidity 2;Time since onset of injury/illness/exacerbation;Age    Examination-Activity Limitations  Locomotion Level;Transfers;Stand;Dressing;Bed Mobility;Stairs    Examination-Participation Restrictions  Meal Prep;Cleaning;Community Activity;Shop;Yard Work    [employer]  Evolving/Moderate complexity    Rehab Potential  Good    PT Frequency  2x / week    PT Duration  8 weeks    PT Treatment/Interventions  ADLs/Self Care Home Management;Stair training;Therapeutic activities;Gait training;Balance training;Neuromuscular re-education;Therapeutic exercise;Patient/family education;Vestibular    PT Next Visit Plan  cont balance and gait training - request order for RW from Dr. Jannifer Franklin    PT Home Exercise Plan  SLS, balance on foam, heel raises marching in place with EO and EC    Consulted and Agree with Plan of Care  Patient       Patient will benefit from skilled therapeutic intervention in order to improve the following deficits and impairments:  Decreased balance, Dizziness,  Difficulty walking, Impaired sensation  Visit Diagnosis: Other abnormalities of gait and mobility - Plan: PT plan of care cert/re-cert  Unsteadiness on feet - Plan: PT plan of care cert/re-cert  Dizziness and giddiness - Plan: PT plan of care cert/re-cert     Problem List Patient Active Problem List   Diagnosis Date Noted  . Dizziness 09/26/2018  . Gait abnormality 05/23/2018  . Skin cancer of scalp or skin of neck 01/19/2018  . Abnormal chest x-ray 02/17/2011  . Osteoarthritis 11/18/2010  . Pulmonary embolism ([place]) 08/15/2010  . Chronic anticoagulation 08/15/2010  . Lung nodule 08/15/2010  . Cor pulmonale ([place]) 08/15/2010  . Dyspnea 07/25/2010  . Glaucoma   . Back pain   . Skin cancer   . Insomnia  Alda Lea, PT 03/15/2019, 8:45 PM  [facility] [address] [place], [place], [address] Phone: [phone]   Fax:  [phone]  Name: [name] MRN: [id number] Date of Birth: March 30, 1932

## 2019-03-16 ENCOUNTER — Ambulatory Visit: Payer: Medicare Other | Admitting: Physical Therapy

## 2019-03-21 ENCOUNTER — Other Ambulatory Visit: Payer: Self-pay

## 2019-03-21 ENCOUNTER — Ambulatory Visit: Payer: Medicare Other | Admitting: Physical Therapy

## 2019-03-21 DIAGNOSIS — R2689 Other abnormalities of gait and mobility: Secondary | ICD-10-CM

## 2019-03-21 DIAGNOSIS — R2681 Unsteadiness on feet: Secondary | ICD-10-CM

## 2019-03-22 ENCOUNTER — Telehealth: Payer: Self-pay | Admitting: Physical Therapy

## 2019-03-22 ENCOUNTER — Encounter: Payer: Self-pay | Admitting: Physical Therapy

## 2019-03-22 DIAGNOSIS — R269 Unspecified abnormalities of gait and mobility: Secondary | ICD-10-CM

## 2019-03-22 NOTE — Telephone Encounter (Signed)
Dr. Jannifer Franklin, I am seeing [name] for gait abnormality and balance disorder in PT - he was referred by Dr. Melida Quitter for dizziness, but his problem is not c/o true vertigo - it is more dysequilibrium and gait instability.  His gait has progressively worsened since June 2020 (time of initial PT eval) until present. In June he was not using a device, started using a SPC a few months ago and now he needs a RW for safety and stability with ambulation.  If you agree would you please place an order for a RW in Epic and we will assist him in obtaining this device.  Fortunately, he has a follow up appt with you scheduled on 04-06-19.  Thank you so much, Guido Sander, PT

## 2019-03-22 NOTE — Telephone Encounter (Signed)
A prescription for rolling walker will be placed.

## 2019-03-22 NOTE — Therapy (Signed)
[facility] [address] [place], [place], [address] Phone: [phone]   Fax:  [phone]  Physical Therapy Treatment  Patient Details  Name: [name] MRN: [id number] Date of Birth: 1932-03-12 Referring Provider (PT): Dr. Melida Quitter   Encounter Date: 03/21/2019  PT End of Session - 03/22/19 2043    Visit Number  10    Number of Visits  14    Date for PT Re-Evaluation  02/17/19    Authorization Type  [employer]HC Medicare    Authorization Time Period  01-03-19 - 03-05-19; 03-14-19 - 05-12-19    PT Start Time  1455   pt arrived 10" late   PT Stop Time  1541    PT Time Calculation (min)  46 min    Equipment Utilized During Treatment  Gait belt    Activity Tolerance  Patient tolerated treatment well    Behavior During Therapy  [facility] for tasks assessed/performed       Past Medical History:  Diagnosis Date  . Back pain   . DVT (deep venous thrombosis) ([place])   . Gait abnormality 05/23/2018  . Glaucoma   . Hypertension   . Insomnia   . Pulmonary embolism, bilateral [facility]) May 2012   on coumadin  . Skin cancer    forehead  . Skin cancer   . Vertigo     Past Surgical History:  Procedure Laterality Date  . BACK SURGERY     x3  . EXCISION MASS NECK Right 01/19/2018   Procedure: Wide local excision of right neck skin cancer (5 cm);  Surgeon: Melida Quitter, MD;  Location: [facility];  Service: ENT;  Laterality: Right;  . RADICAL NECK DISSECTION Right 01/19/2018    Right modified radical neck dissection; Cervicopectoral flap (Right Neck)  . RADICAL NECK DISSECTION Right 01/19/2018   Procedure: Right modified radical neck dissection; Cervicopectoral flap;  Surgeon: Melida Quitter, MD;  Location: [facility];  Service: ENT;  Laterality: Right;  . SHOULDER SURGERY     x3  Dr. Karrie Doffing al.  . TOTAL HIP ARTHROPLASTY  2012   Dr. Alvan Dame right hip  . TRANSTHORACIC ECHOCARDIOGRAM  07/26/2010   Left ventricle: The cavity size was normal. Wall  thickness was increased in a pattern of moderate LVH. Systolic function was normal. The estimated ejection fraction was in the range of 55% to 60%    There were no vitals filed for this visit.  Subjective Assessment - 03/22/19 2031    Subjective  Pt reports he is doing about the same - seems like he is able to pick up his feet a little better today    Patient Stated Goals  "end the dizziness and the balance problem"    Currently in Pain?  No/denies                       [facility] Adult PT Treatment/Exercise - 03/22/19 0001      Transfers   Transfers  Sit to Stand;Stand to Sit    Number of Reps  10 reps    Comments  5 reps feet on floor; 5 reps feet on Airex - UE support needed with feet on Airex      Ambulation/Gait   Ambulation/Gait  Yes    Ambulation/Gait Assistance  4: Min guard   cues to stay close to RW and keep feet inside during turns   Ambulation Distance (Feet)  350 Feet    Assistive device  Rolling walker    Gait Pattern  Festinating;Trunk flexed;Decreased step length - right;Decreased step length - left   decreased heel strike bilateral LE's in stance   Ambulation Surface  Level;Indoor        PWR [facility]) - 03/22/19 2041    PWR! Rock  10 reps to [employer] and Lt sides    PWR Step  forward/back, laterally, back/up 10 reps each leg each direction with UE support for balance             PT Short Term Goals - 03/22/19 2056      PT SHORT TERM GOAL #1   Title  Perform SOT and establish LTG as appropriate based on score/results.    Time  4    Period  Weeks    Status  Deferred    Target Date  08/31/18      PT SHORT TERM GOAL #2   Title  Increase Berg score from 42/56 to >/= 47/56 to decrease fall risk.    Baseline  42/56 ; 47/56 on 09-01-18    Time  4    Period  Weeks    Status  Achieved    Target Date  08/31/18      PT SHORT TERM GOAL #3   Title  Improve TUG score from 26.87 secs to </= 21 secs without device to demo improved mobility with decr. fall  risk.    Baseline  26.87 secs;            12.62 secs no device    Time  4    Period  Weeks    Status  Achieved    Target Date  08/31/18      PT SHORT TERM GOAL #4   Title  Increase gait velocity from 2.64 ft/sec to >/= 3.0 ft/sec for incr. gait efficiency.      Baseline  2.64 ft/sec = 12.42 secs ;  12.28    Time  4    Period  Weeks    Status  Not Met    Target Date  08/31/18      PT SHORT TERM GOAL #5   Title  Independent in HEP for balance exercises.    Time  4    Period  Weeks    Status  Achieved    Target Date  08/31/18        PT Long Term Goals - 03/22/19 2056      PT LONG TERM GOAL #1   Title  Perform Sensory Organization Test and establish goal as appropriate.    Time  8    Period  Weeks    Status  Deferred      PT LONG TERM GOAL #2   Title  Improve TUG score from 26.87 secs without device to </= 18 secs without device to reduce fall risk. UPGRADED LTG = TUG score </= 14.0 secs with no device    Baseline  26.87 secs no device ; 12.62;       met - 16.13 secs on 01-03-19    Time  8    Period  Weeks    Status  Achieved      PT LONG TERM GOAL #3   Title  Increase gait velocity from 2.64 ft/sec to 3.3 ft/sec without device for incr. gait efficiency. REVISED LTG - gait velocity >/= 2.7 ft/sec without device    Baseline  2.64 ft/sec =12.42 secs     15.56 secs on 01-03-19 = 2.11  ft/sec on 01-03-19    Time  8    Period  Weeks    Status  Revised      PT LONG TERM GOAL #4   Title  Improve DHI score by at least 15 points to demo improvement in dizziness.    Baseline  DHI to be assessed    Time  8    Period  Weeks    Status  Deferred      PT LONG TERM GOAL #5   Title  Independent in updated HEP as appropriate.    Time  8    Period  Weeks    Status  On-going            Plan - 03/22/19 2046    Clinical Impression Statement  Pt continues to demonstrate gait deviations characteristic of parkinsonism with shuffling gait and increased trunk flexion with pt  reporting episodes of gaining momentum/speed during gait with difficulty controlling speed.  Pt needs RW for stability with ambulation.- order has been requested and received from Dr. Jannifer Franklin.    PT Frequency  2x / week    PT Duration  8 weeks    PT Treatment/Interventions  ADLs/Self Care Home Management;Stair training;Therapeutic activities;Gait training;Balance training;Neuromuscular re-education;Therapeutic exercise;Patient/family education;Vestibular    PT Next Visit Plan  cont balance and gait training - request order for RW from Dr. Jannifer Franklin       Patient will benefit from skilled therapeutic intervention in order to improve the following deficits and impairments:     Visit Diagnosis: Other abnormalities of gait and mobility  Unsteadiness on feet     Problem List Patient Active Problem List   Diagnosis Date Noted  . Dizziness 09/26/2018  . Gait abnormality 05/23/2018  . Skin cancer of scalp or skin of neck 01/19/2018  . Abnormal chest x-ray 02/17/2011  . Osteoarthritis 11/18/2010  . Pulmonary embolism ([place]) 08/15/2010  . Chronic anticoagulation 08/15/2010  . Lung nodule 08/15/2010  . Cor pulmonale ([place]) 08/15/2010  . Dyspnea 07/25/2010  . Glaucoma   . Back pain   . Skin cancer   . Insomnia     Christinamarie Tall, [place], PT 03/22/2019, 8:59 PM  [facility] [address] [place], [place], [address] Phone: [phone]   Fax:  [phone]  Name: GENESIS PAGET MRN: [id number] Date of Birth: [birth year]

## 2019-03-23 ENCOUNTER — Other Ambulatory Visit: Payer: Self-pay

## 2019-03-23 ENCOUNTER — Ambulatory Visit: Payer: Medicare Other | Admitting: Physical Therapy

## 2019-03-23 DIAGNOSIS — R2681 Unsteadiness on feet: Secondary | ICD-10-CM

## 2019-03-23 DIAGNOSIS — R2689 Other abnormalities of gait and mobility: Secondary | ICD-10-CM

## 2019-03-24 ENCOUNTER — Encounter: Payer: Self-pay | Admitting: Physical Therapy

## 2019-03-24 NOTE — Therapy (Signed)
[facility] [address] [place], [place], [address] Phone: [phone]   Fax:  [phone]  Physical Therapy Treatment  Patient Details  Name: [name] MRN: [id number] Date of Birth: 1932-11-01 Referring Provider (PT): Dr. Melida Quitter   Encounter Date: 03/23/2019  PT End of Session - 03/24/19 1446    Visit Number  11    Number of Visits  14    Date for PT Re-Evaluation  02/17/19    Authorization Type  [employer] Medicare    Authorization Time Period  01-03-19 - 03-05-19; 03-14-19 - 05-12-19    PT Start Time  1536    PT Stop Time  1617    PT Time Calculation (min)  41 min    Equipment Utilized During Treatment  Gait belt    Activity Tolerance  Patient tolerated treatment well    Behavior During Therapy  [facility] for tasks assessed/performed       Past Medical History:  Diagnosis Date  . Back pain   . DVT (deep venous thrombosis) ([place])   . Gait abnormality 05/23/2018  . Glaucoma   . Hypertension   . Insomnia   . Pulmonary embolism, bilateral [facility]) May 2012   on coumadin  . Skin cancer    forehead  . Skin cancer   . Vertigo     Past Surgical History:  Procedure Laterality Date  . BACK SURGERY     x3  . EXCISION MASS NECK Right 01/19/2018   Procedure: Wide local excision of right neck skin cancer (5 cm);  Surgeon: Melida Quitter, MD;  Location: [facility];  Service: ENT;  Laterality: Right;  . RADICAL NECK DISSECTION Right 01/19/2018    Right modified radical neck dissection; Cervicopectoral flap (Right Neck)  . RADICAL NECK DISSECTION Right 01/19/2018   Procedure: Right modified radical neck dissection; Cervicopectoral flap;  Surgeon: Melida Quitter, MD;  Location: [facility];  Service: ENT;  Laterality: Right;  . SHOULDER SURGERY     x3  Dr. Karrie Doffing al.  . TOTAL HIP ARTHROPLASTY  2012   Dr. Alvan Dame right hip  . TRANSTHORACIC ECHOCARDIOGRAM  07/26/2010   Left ventricle: The cavity size was normal. Wall thickness was increased  in a pattern of moderate LVH. Systolic function was normal. The estimated ejection fraction was in the range of 55% to 60%    There were no vitals filed for this visit.  Subjective Assessment - 03/24/19 1438    Subjective  Pt states he did not sleep well last night -- pt noted to have near LOB with amb. (gains momentum with amb. and has difficulty controlling speed) with walking from clinic lobby to gym; pt using [facility]    Patient Stated Goals  "end the dizziness and the balance problem"    Currently in Pain?  No/denies                       [facility] Adult PT Treatment/Exercise - 03/24/19 0001      Ambulation/Gait   Ambulation/Gait  Yes    Ambulation/Gait Assistance  4: Min guard;4: Min assist    Ambulation Distance (Feet)  100 Feet    Assistive device  Straight cane    Gait Pattern  Festinating;Trunk flexed;Decreased step length - right;Decreased step length - left   decreased heel strike bilateral LE's in stance   Ambulation Surface  Level;Indoor        [facility]) - 03/24/19 1441  PWR Step  pt performed 10 reps each direction with each leg - forward/back, laterally and back/up with 1 UE support on // bar       Balance Exercises - 03/24/19 1440      Balance Exercises: Standing   Stepping Strategy  Anterior;Lateral;5 reps;Posterior   5 reps each leg    Rockerboard  Anterior/posterior;EO;10 reps   with UE support inside // bars   Step Over Hurdles / Cones  stepping over balance beam 5 reps each foot with UE support     Other Standing Exercises Comments  tap ups to 4" step 5 reps each foot with UE support on // bar prn        PT Education - 03/24/19 1443    Education Details  pt was given prescription for RW ( recommend standard RW with 5" wheels) as pt is unable to safely control & maneuver a rollator and is not as safe with use of rollator as he is with standard RW; pt was instructed to take the order to [place] if he wishes to have this filed for  insurance to pay for it;  pt verbalized understanding    Person(s) Educated  Patient    Methods  Explanation   pt was given the order from Dr. Jannifer Franklin for the RW   Comprehension  Verbalized understanding       PT Short Term Goals - 03/24/19 1454      PT SHORT TERM GOAL #1   Title  Perform SOT and establish LTG as appropriate based on score/results.    Time  4    Period  Weeks    Status  Deferred    Target Date  08/31/18      PT SHORT TERM GOAL #2   Title  Increase Berg score from 42/56 to >/= 47/56 to decrease fall risk.    Baseline  42/56 ; 47/56 on 09-01-18    Time  4    Period  Weeks    Status  Achieved    Target Date  08/31/18      PT SHORT TERM GOAL #3   Title  Improve TUG score from 26.87 secs to </= 21 secs without device to demo improved mobility with decr. fall risk.    Baseline  26.87 secs;            12.62 secs no device    Time  4    Period  Weeks    Status  Achieved    Target Date  08/31/18      PT SHORT TERM GOAL #4   Title  Increase gait velocity from 2.64 ft/sec to >/= 3.0 ft/sec for incr. gait efficiency.      Baseline  2.64 ft/sec = 12.42 secs ;  12.28    Time  4    Period  Weeks    Status  Not Met    Target Date  08/31/18      PT SHORT TERM GOAL #5   Title  Independent in HEP for balance exercises.    Time  4    Period  Weeks    Status  Achieved    Target Date  08/31/18        PT Long Term Goals - 03/24/19 1454      PT LONG TERM GOAL #1   Title  Perform Sensory Organization Test and establish goal as appropriate.    Time  8    Period  Weeks  Status  Deferred      PT LONG TERM GOAL #2   Title  Improve TUG score from 26.87 secs without device to </= 18 secs without device to reduce fall risk. UPGRADED LTG = TUG score </= 14.0 secs with no device    Baseline  26.87 secs no device ; 12.62;       met - 16.13 secs on 01-03-19    Time  8    Period  Weeks    Status  Achieved      PT LONG TERM GOAL #3   Title  Increase gait velocity from 2.64  ft/sec to 3.3 ft/sec without device for incr. gait efficiency. REVISED LTG - gait velocity >/= 2.7 ft/sec without device    Baseline  2.64 ft/sec =12.42 secs     15.56 secs on 01-03-19 = 2.11 ft/sec on 01-03-19    Time  8    Period  Weeks    Status  Revised      PT LONG TERM GOAL #4   Title  Improve DHI score by at least 15 points to demo improvement in dizziness.    Baseline  DHI to be assessed    Time  8    Period  Weeks    Status  Deferred      PT LONG TERM GOAL #5   Title  Independent in updated HEP as appropriate.    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  Pt would greatly benefit from use of RW to increase stability and safety with amb. - especially with commuity amb.  Pt was given order from D. [employer] for [employer] and was instructed to take to [employer] for insurance coverage for this device.  Plan is to place pt on HOLD for PT until after appt. with Dr. Jannifer Franklin on 04-06-19 due to lack of progress in PT with progression in gait disorder noted since initial PT eval in June 2020.    PT Frequency  2x / week    PT Duration  8 weeks    PT Treatment/Interventions  ADLs/Self Care Home Management;Stair training;Therapeutic activities;Gait training;Balance training;Neuromuscular re-education;Therapeutic exercise;Patient/family education;Vestibular    PT Next Visit Plan  pt on hold until after 04-06-19 (appt with Dr. Jannifer Franklin); pt was given order for RW and instructed to obtain (at [place] if he wants it filed on insurance) - pt verbalized agreement & understanding       Patient will benefit from skilled therapeutic  intervention in order to improve the following deficits and impairments:     Visit Diagnosis: Other abnormalities of gait and mobility  Unsteadiness on feet     Problem List Patient Active Problem List   Diagnosis Date Noted  . Dizziness 09/26/2018  . Gait abnormality 05/23/2018  . Skin cancer of scalp or skin of neck 01/19/2018  . Abnormal chest x-ray 02/17/2011  . Osteoarthritis 11/18/2010  . Pulmonary embolism ([place]) 08/15/2010  . Chronic anticoagulation 08/15/2010  . Lung nodule 08/15/2010  . Cor pulmonale ([place]) 08/15/2010  .  Dyspnea 07/25/2010  . Glaucoma   . Back pain   . Skin cancer   . Insomnia     Brick Ketcher, [place], PT 03/24/2019, 2:56 PM  [facility] [address] [place], [place], [address] Phone: [phone]   Fax:  [phone]  Name: [name] MRN: [id number] Date of Birth: [birth year]

## 2019-04-06 ENCOUNTER — Other Ambulatory Visit: Payer: Self-pay

## 2019-04-06 ENCOUNTER — Encounter: Payer: Self-pay | Admitting: Neurology

## 2019-04-06 ENCOUNTER — Ambulatory Visit: Payer: Medicare Other | Admitting: Neurology

## 2019-04-06 ENCOUNTER — Telehealth: Payer: Self-pay | Admitting: Neurology

## 2019-04-06 VITALS — BP 129/74 | HR 93 | Temp 97.5°F | Ht 70.0 in | Wt 220.0 lb

## 2019-04-06 DIAGNOSIS — R269 Unspecified abnormalities of gait and mobility: Secondary | ICD-10-CM

## 2019-04-06 DIAGNOSIS — R42 Dizziness and giddiness: Secondary | ICD-10-CM

## 2019-04-06 DIAGNOSIS — E538 Deficiency of other specified B group vitamins: Secondary | ICD-10-CM

## 2019-04-06 NOTE — Patient Instructions (Signed)
Stop the diazepam 

## 2019-04-06 NOTE — Telephone Encounter (Signed)
This patient did not show for a revisit appointment today. 

## 2019-04-06 NOTE — Progress Notes (Signed)
Reason for visit: Gait disorder, dizziness  [name] is an 86 y.o. male  History of present illness:  [name] is an 86 year old right-handed white male with a history of a gait disorder.  The patient has continued to have issues with walking, he is falling relatively frequently, he fell yesterday.  He uses a cane for ambulation, he does not believe that he has ever fallen with a cane.  He is completing physical therapy for gait training without much benefit.  He is going to get a walker for ambulation.  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  There is no clear evidence of normal pressure hydrocephalus.  He was set up for EMG and nerve conduction study evaluation, but this was never done likely secondary to the Covid pandemic.  The patient has had evidence of a low copper level and B12 level, he is on supplementation.  He was given a trial on diazepam for some of the wooziness that he complained of, it is not clear that this was ever very helpful for him.  He feels somewhat depressed as he is not able to do things he wants to do or needs to do.  He does operate a motor vehicle without difficulty.  He reports some numbness in the feet, he has a history of 3 prior lumbosacral spine surgeries, he does not have severe back pain at this time.  Past Medical History:  Diagnosis Date  . Back pain   . DVT (deep venous thrombosis) ([place])   . Gait abnormality 05/23/2018  . Glaucoma   . Hypertension   . Insomnia   . Pulmonary embolism, bilateral [facility]) May 2012   on coumadin  . Skin cancer    forehead  . Skin cancer   . Vertigo     Past Surgical History:  Procedure Laterality Date  . BACK SURGERY     x3  . EXCISION MASS NECK Right 01/19/2018   Procedure: Wide local excision of right neck skin cancer (5 cm);  Surgeon: Melida Quitter, MD;  Location: [facility];  Service: ENT;  Laterality: Right;  . RADICAL NECK DISSECTION Right 01/19/2018    Right modified  radical neck dissection; Cervicopectoral flap (Right Neck)  . RADICAL NECK DISSECTION Right 01/19/2018   Procedure: Right modified radical neck dissection; Cervicopectoral flap;  Surgeon: Melida Quitter, MD;  Location: [facility];  Service: ENT;  Laterality: Right;  . SHOULDER SURGERY     x3  Dr. Karrie Doffing al.  . TOTAL HIP ARTHROPLASTY  2012   Dr. Alvan Dame right hip  . TRANSTHORACIC ECHOCARDIOGRAM  07/26/2010   Left ventricle: The cavity size was normal. Wall thickness was increased in a pattern of moderate LVH. Systolic function was normal. The estimated ejection fraction was in the range of 55% to 60%    Family History  Problem Relation Age of Onset  . Stroke Father   . Alzheimer's disease Mother   . Lung cancer Brother     Social history:  reports that he quit smoking about 38 years ago. His smoking use included cigarettes. He has a 25.00 pack-year smoking history. He quit smokeless tobacco use about 11 years ago.  His smokeless tobacco use included chew. He reports current alcohol use. He reports that he does not use drugs.    Allergies  Allergen Reactions  . Codeine Nausea And Vomiting  . Morphine And Related Nausea And Vomiting    Medications:  Prior to  Admission medications   Medication Sig Start Date End Date Taking? Authorizing Provider  Ascorbic Acid (VITAMIN C) 1000 MG tablet Take 1,000 mg by mouth daily.     Yes [provider]  aspirin 81 MG tablet Take 81 mg by mouth daily.    Yes [provider]  cholecalciferol (VITAMIN D3) 25 MCG (1000 UT) tablet Take 1,000 Units by mouth daily.    Yes [provider]  Copper Gluconate (COPPER CAPS PO) Take 2 mg by mouth.   Yes [provider]  diazepam (VALIUM) 2 MG tablet TAKE 1 TABLET BY MOUTH 2 TIMES DAILY 12/27/18  Yes Kathrynn Ducking, MD  GLUCOSAMINE-CHONDROITIN PO Take 1 tablet by mouth daily.   Yes [provider]  losartan (COZAAR) 100 MG tablet Take 1 tablet (100 mg total) by mouth  daily. NEED OV. 10/05/16  Yes Croitoru, Mihai, MD  meclizine (ANTIVERT) 25 MG tablet Take 25 mg by mouth every 6 (six) hours as needed for dizziness.    Yes [provider]  mirtazapine (REMERON) 30 MG tablet Take 30 mg by mouth at bedtime. 01/13/15  Yes [provider]  Red Yeast Rice 600 MG CAPS Take 1,200 mg by mouth daily.    Yes [provider]  vitamin B-12 (CYANOCOBALAMIN) 1000 MCG tablet Take 1,000 mcg by mouth daily.   Yes [provider]  vitamin E 400 UNIT capsule Take 400 Units by mouth daily.    Yes [provider]  diphenhydrAMINE (BENADRYL) 50 MG capsule Take 50 mg by mouth daily.    [provider]    ROS:  Out of a complete 14 system review of symptoms, the patient complains only of the following symptoms, and all other reviewed systems are negative.  Walking difficulty Dizziness   Blood pressure 129/74, pulse 93, temperature (!) 97.5 F (36.4 C), height 5\' 10"  (1.778 m), weight 220 lb (99.8 kg).  Physical Exam  General: The patient is alert and cooperative at the time of the examination.  The patient is moderately obese.  Skin: 2-3+ edema below the knees is seen bilaterally.   Neurologic Exam  Mental status: The patient is alert and oriented x 3 at the time of the examination. The patient has apparent normal recent and remote memory, with an apparently normal attention span and concentration ability.   Cranial nerves: Facial symmetry is present. Speech is normal, no aphasia or dysarthria is noted. Extraocular movements are full. Visual fields are full.  Motor: The patient has good strength in all 4 extremities.  Sensory examination: Soft touch sensation is symmetric on the face, arms, and legs.  Coordination: The patient has good finger-nose-finger and heel-to-shin bilaterally.  Gait and station: The patient has some difficulty rising from seated position, he can do some minimal assistance.  Once up, he is  able to walk with assistance, wide-based gait is seen.  Tandem gait was not attempted.  Romberg is negative but is slightly unsteady.  Reflexes: Deep tendon reflexes are symmetric, but are depressed.   Assessment/Plan:  1.  Gait disorder, multiple falls  2.  Low copper level  We will recheck blood work today.  The patient is on copper and vitamin B12 supplementation.  The patient will be set up for EMG nerve conduction study again, nerve conductions will be done on both legs and EMG on 1 leg.  He will follow-up otherwise in 6 months.  Depending upon the results of the EMG, we may consider MRI of the  lumbar spine, he has had 3 prior lumbosacral spine procedures.  I would agree with obtaining a walker, this may help his stability with ambulation.   Jill Alexanders MD 04/06/2019 1:16 PM  [facility] [address] [place], [place] [address]  Phone [phone] Fax [phone]

## 2019-04-08 LAB — COPPER, SERUM: Copper: 116 ug/dL (ref 69–132)

## 2019-04-08 LAB — VITAMIN B12: Vitamin B-12: 324 pg/mL (ref 232–1245)

## 2019-04-11 ENCOUNTER — Telehealth: Payer: Self-pay

## 2019-04-11 NOTE — Telephone Encounter (Signed)
I called pt that labs were unremarkable copper level is normal range. PT verbalized understanding.

## 2019-04-11 NOTE — Telephone Encounter (Signed)
-----   Message from Kathrynn Ducking, MD sent at 04/10/2019  4:34 PM EST ----- Blood work is unremarkable, copper level is now and the normal range.  Please call the patient. ----- Message ----- From: Lavone Neri Lab Results In Sent: 04/07/2019   1:36 PM EST To: Kathrynn Ducking, MD

## 2019-04-24 ENCOUNTER — Inpatient Hospital Stay (HOSPITAL_COMMUNITY)
Admission: EM | Admit: 2019-04-24 | Discharge: 2019-04-28 | DRG: 309 | Disposition: A | Discharge status: Skilled Nursing Facility | Payer: Medicare Other | Attending: Internal Medicine | Admitting: Internal Medicine

## 2019-04-24 ENCOUNTER — Emergency Department (HOSPITAL_COMMUNITY): Payer: Medicare Other

## 2019-04-24 ENCOUNTER — Observation Stay (HOSPITAL_COMMUNITY): Payer: Medicare Other

## 2019-04-24 ENCOUNTER — Other Ambulatory Visit: Payer: Self-pay

## 2019-04-24 ENCOUNTER — Encounter (HOSPITAL_COMMUNITY): Payer: Self-pay | Admitting: Emergency Medicine

## 2019-04-24 DIAGNOSIS — W1830XA Fall on same level, unspecified, initial encounter: Secondary | ICD-10-CM | POA: Diagnosis present

## 2019-04-24 DIAGNOSIS — R269 Unspecified abnormalities of gait and mobility: Secondary | ICD-10-CM

## 2019-04-24 DIAGNOSIS — Z87891 Personal history of nicotine dependence: Secondary | ICD-10-CM

## 2019-04-24 DIAGNOSIS — R55 Syncope and collapse: Secondary | ICD-10-CM | POA: Diagnosis present

## 2019-04-24 DIAGNOSIS — W19XXXA Unspecified fall, initial encounter: Secondary | ICD-10-CM | POA: Diagnosis not present

## 2019-04-24 DIAGNOSIS — R52 Pain, unspecified: Secondary | ICD-10-CM

## 2019-04-24 DIAGNOSIS — Z7982 Long term (current) use of aspirin: Secondary | ICD-10-CM

## 2019-04-24 DIAGNOSIS — R930 Abnormal findings on diagnostic imaging of skull and head, not elsewhere classified: Secondary | ICD-10-CM | POA: Diagnosis present

## 2019-04-24 DIAGNOSIS — F419 Anxiety disorder, unspecified: Secondary | ICD-10-CM | POA: Diagnosis present

## 2019-04-24 DIAGNOSIS — I4891 Unspecified atrial fibrillation: Secondary | ICD-10-CM | POA: Diagnosis not present

## 2019-04-24 DIAGNOSIS — R296 Repeated falls: Secondary | ICD-10-CM

## 2019-04-24 DIAGNOSIS — I480 Paroxysmal atrial fibrillation: Principal | ICD-10-CM | POA: Diagnosis present

## 2019-04-24 DIAGNOSIS — Z96641 Presence of right artificial hip joint: Secondary | ICD-10-CM | POA: Diagnosis present

## 2019-04-24 DIAGNOSIS — Z86718 Personal history of other venous thrombosis and embolism: Secondary | ICD-10-CM

## 2019-04-24 DIAGNOSIS — S02401A Maxillary fracture, unspecified, initial encounter for closed fracture: Secondary | ICD-10-CM

## 2019-04-24 DIAGNOSIS — F329 Major depressive disorder, single episode, unspecified: Secondary | ICD-10-CM | POA: Diagnosis present

## 2019-04-24 DIAGNOSIS — Z85828 Personal history of other malignant neoplasm of skin: Secondary | ICD-10-CM

## 2019-04-24 DIAGNOSIS — R739 Hyperglycemia, unspecified: Secondary | ICD-10-CM | POA: Diagnosis present

## 2019-04-24 DIAGNOSIS — Y92009 Unspecified place in unspecified non-institutional (private) residence as the place of occurrence of the external cause: Secondary | ICD-10-CM

## 2019-04-24 DIAGNOSIS — R7989 Other specified abnormal findings of blood chemistry: Secondary | ICD-10-CM

## 2019-04-24 DIAGNOSIS — Z20822 Contact with and (suspected) exposure to covid-19: Secondary | ICD-10-CM | POA: Diagnosis present

## 2019-04-24 DIAGNOSIS — Z8589 Personal history of malignant neoplasm of other organs and systems: Secondary | ICD-10-CM

## 2019-04-24 DIAGNOSIS — R748 Abnormal levels of other serum enzymes: Secondary | ICD-10-CM

## 2019-04-24 DIAGNOSIS — M6282 Rhabdomyolysis: Secondary | ICD-10-CM | POA: Diagnosis present

## 2019-04-24 DIAGNOSIS — S0240CA Maxillary fracture, right side, initial encounter for closed fracture: Secondary | ICD-10-CM | POA: Diagnosis present

## 2019-04-24 DIAGNOSIS — Z86711 Personal history of pulmonary embolism: Secondary | ICD-10-CM | POA: Diagnosis present

## 2019-04-24 DIAGNOSIS — Z79899 Other long term (current) drug therapy: Secondary | ICD-10-CM

## 2019-04-24 DIAGNOSIS — I10 Essential (primary) hypertension: Secondary | ICD-10-CM | POA: Diagnosis present

## 2019-04-24 DIAGNOSIS — E870 Hyperosmolality and hypernatremia: Secondary | ICD-10-CM | POA: Diagnosis present

## 2019-04-24 DIAGNOSIS — E860 Dehydration: Secondary | ICD-10-CM | POA: Diagnosis present

## 2019-04-24 LAB — URINALYSIS, ROUTINE W REFLEX MICROSCOPIC
Bacteria, UA: NONE SEEN
Bilirubin Urine: NEGATIVE
Glucose, UA: NEGATIVE mg/dL
Ketones, ur: 80 mg/dL — AB
Leukocytes,Ua: NEGATIVE
Nitrite: NEGATIVE
Protein, ur: 30 mg/dL — AB
Specific Gravity, Urine: 1.029 (ref 1.005–1.030)
pH: 6 (ref 5.0–8.0)

## 2019-04-24 LAB — I-STAT CHEM 8, ED
BUN: 38 mg/dL — ABNORMAL HIGH (ref 8–23)
Calcium, Ion: 1.03 mmol/L — ABNORMAL LOW (ref 1.15–1.40)
Chloride: 111 mmol/L (ref 98–111)
Creatinine, Ser: 0.8 mg/dL (ref 0.61–1.24)
Glucose, Bld: 150 mg/dL — ABNORMAL HIGH (ref 70–99)
HCT: 50 % (ref 39.0–52.0)
Hemoglobin: 17 g/dL (ref 13.0–17.0)
Potassium: 4.2 mmol/L (ref 3.5–5.1)
Sodium: 144 mmol/L (ref 135–145)
TCO2: 26 mmol/L (ref 22–32)

## 2019-04-24 LAB — COMPREHENSIVE METABOLIC PANEL
ALT: 62 U/L — ABNORMAL HIGH (ref 0–44)
AST: 93 U/L — ABNORMAL HIGH (ref 15–41)
Albumin: 3.6 g/dL (ref 3.5–5.0)
Alkaline Phosphatase: 71 U/L (ref 38–126)
Anion gap: 13 (ref 5–15)
BUN: 33 mg/dL — ABNORMAL HIGH (ref 8–23)
CO2: 22 mmol/L (ref 22–32)
Calcium: 8.6 mg/dL — ABNORMAL LOW (ref 8.9–10.3)
Chloride: 109 mmol/L (ref 98–111)
Creatinine, Ser: 1.04 mg/dL (ref 0.61–1.24)
GFR calc Af Amer: >60 mL/min (ref >60)
GFR calc non Af Amer: >60 mL/min (ref >60)
Glucose, Bld: 162 mg/dL — ABNORMAL HIGH (ref 70–99)
Potassium: 4.1 mmol/L (ref 3.5–5.1)
Sodium: 144 mmol/L (ref 135–145)
Total Bilirubin: 1.6 mg/dL — ABNORMAL HIGH (ref 0.3–1.2)
Total Protein: 7 g/dL (ref 6.5–8.1)

## 2019-04-24 LAB — CBC WITH DIFFERENTIAL/PLATELET
Abs Immature Granulocytes: 0.07 10*3/uL (ref 0.00–0.07)
Basophils Absolute: 0 10*3/uL (ref 0.0–0.1)
Basophils Relative: 0 %
Eosinophils Absolute: 0 10*3/uL (ref 0.0–0.5)
Eosinophils Relative: 0 %
HCT: 51.7 % (ref 39.0–52.0)
Hemoglobin: 17 g/dL (ref 13.0–17.0)
Immature Granulocytes: 1 %
Lymphocytes Relative: 8 %
Lymphs Abs: 1.1 10*3/uL (ref 0.7–4.0)
MCH: 33.5 pg (ref 26.0–34.0)
MCHC: 32.9 g/dL (ref 30.0–36.0)
MCV: 102 fL — ABNORMAL HIGH (ref 80.0–100.0)
Monocytes Absolute: 1.1 10*3/uL — ABNORMAL HIGH (ref 0.1–1.0)
Monocytes Relative: 8 %
Neutro Abs: 10.6 10*3/uL — ABNORMAL HIGH (ref 1.7–7.7)
Neutrophils Relative %: 83 %
Platelets: 224 10*3/uL (ref 150–400)
RBC: 5.07 MIL/uL (ref 4.22–5.81)
RDW: 13.2 % (ref 11.5–15.5)
WBC: 12.8 10*3/uL — ABNORMAL HIGH (ref 4.0–10.5)
nRBC: 0 % (ref 0.0–0.2)

## 2019-04-24 LAB — LACTIC ACID, PLASMA
Lactic Acid, Venous: 2.5 mmol/L (ref 0.5–1.9)
Lactic Acid, Venous: 2 mmol/L (ref 0.5–1.9)

## 2019-04-24 LAB — SARS CORONAVIRUS 2 (TAT 6-24 HRS): SARS Coronavirus 2: NEGATIVE

## 2019-04-24 LAB — PROTIME-INR
INR: 1.1 (ref 0.8–1.2)
Prothrombin Time: 14.3 seconds (ref 11.4–15.2)

## 2019-04-24 LAB — APTT: aPTT: 22 seconds — ABNORMAL LOW (ref 24–36)

## 2019-04-24 LAB — CK: Total CK: 2166 U/L — ABNORMAL HIGH (ref 49–397)

## 2019-04-24 MED ORDER — ACETAMINOPHEN 325 MG PO TABS
650.0000 mg | ORAL_TABLET | Freq: Four times a day (QID) | ORAL | Status: DC | PRN
  Filled 2019-04-24: qty 2

## 2019-04-24 MED ORDER — FLUORESCEIN SODIUM 1 MG OP STRP: 1.0000 | ORAL_STRIP | Freq: Once | OPHTHALMIC | Status: DC

## 2019-04-24 MED ORDER — TETRACAINE HCL 0.5 % OP SOLN
1.0000 [drp] | Freq: Once | OPHTHALMIC | Status: DC
  Filled 2019-04-24: qty 4

## 2019-04-24 MED ORDER — SODIUM CHLORIDE 0.9% FLUSH
3.0000 mL | Freq: Two times a day (BID) | INTRAVENOUS | Status: DC
  Administered 2019-04-24 – 2019-04-28 (×6): 3 mL via INTRAVENOUS

## 2019-04-24 MED ORDER — SODIUM CHLORIDE 0.9 % IV BOLUS
1000.0000 mL | Freq: Once | INTRAVENOUS | Status: AC
  Administered 2019-04-24: 1000 mL via INTRAVENOUS

## 2019-04-24 MED ORDER — COPPER GLUCONATE 2 MG PO CAPS: 2.0000 mg | ORAL_CAPSULE | Freq: Every day | ORAL | Status: DC

## 2019-04-24 MED ORDER — SODIUM CHLORIDE 0.9 % IV SOLN: INTRAVENOUS | Status: DC

## 2019-04-24 MED ORDER — ACETAMINOPHEN 650 MG RE SUPP: 650.0000 mg | Freq: Four times a day (QID) | RECTAL | Status: DC | PRN

## 2019-04-24 MED ORDER — VITAMIN B-12 1000 MCG PO TABS
1000.0000 ug | ORAL_TABLET | Freq: Every day | ORAL | Status: DC
  Administered 2019-04-25 – 2019-04-28 (×4): 1000 ug via ORAL
  Filled 2019-04-24 (×4): qty 1

## 2019-04-24 NOTE — H&P (Addendum)
History and Physical    [name]:[id number] DOB: 1933-01-27 DOA: 04/24/2019  PCP: Leonard Downing, MD  Patient coming from: Home via EMS  I have personally briefly reviewed patient's old medical records in [place]  Chief Complaint: Fall at home  HPI: [name] is a 86 y.o. male with medical history significant for history of PE in 2012 (on aspirin alone after completing 6 months of Coumadin), hypertension, and gait disorder who presents to the ED for evaluation after being found down at home.  Patient is unable to provide significant history therefore majority history is obtained from EDP and chart review.  Per EDP, patient's neighbors have been attempting to reach him over the last 4 days and knocking on his door that he had not been answering.  They saw that his truck was in the driveway and had not moved.  Earlier today (04/24/2019) they called out for a welfare check.  Patient was found down on the floor.  On arrival he was initially complaining of pain on his right side.  He does not remember the circumstances regarding his fall or what has happened recently.  He currently denies any pain, chest pain, dyspnea, nausea, vomiting, diarrhea.  He denies any history of stroke or seizure activity.  He says he does not use a cane or walker to ambulate.  He has a known history of gait disorder and is followed by neurology.  He has had copper and B12 deficiency which are improving with supplementation.  He has been going to outpatient rehab.  ED Course:  Initial vitals showed BP 158/92, pulse 103, RR 22, temp 96.5 Fahrenheit rectally, SPO2 97% on room air.  Repeat temperature was 98.80F rectally  Labs are notable for WBC 12.8, hemoglobin 17, platelets 224,000, BUN 33, creatinine 1.04, sodium 144, potassium 4.1, bicarb 22, serum glucose 162, AST 93, ALT 62, alk phos 71, total bilirubin 1.6, CK 2166, lactic acid 2.5 >> 2.0.  Urinalysis showed negative nitrites, negative  leukocytes, 0-5 RBC/hpf, 0-5 WBC/hpf, no bacteria microscopy.  Blood and urine cultures were obtained and pending.  SARS-CoV-2 PCR was collected and pending.  Portable chest x-ray was negative for focal consolidation, effusion, pneumothorax, or edema.  Right elbow x-ray was negative for evidence of fracture, dislocation, or joint effusion.  Right wrist x-ray showed soft tissue edema without evidence of fracture or dislocation.  Pelvic x-ray was negative for pelvic fracture.  Right hip arthroplasty was seen in expected alignment without complication.  CT head/cervical spine/maxillofacial without contrast was notable for suspected nondisplaced fracture of the lateral and posterior walls of the right maxillary sinus.  No acute intracranial pathology or displaced fractures of the facial bones noted.  Unchanged appearance of enlarged lateral ventricles seen, likely ex vacuo.  No fracture or static subluxation of the cervical spine seen.  Patient was given 1 L normal saline.  EDP to discuss with on-call ENT.  The hospitalist service was consulted to admit for further evaluation and management.  Review of Systems:  All systems reviewed and are negative except as documented in history of present illness above.   Past Medical History:  Diagnosis Date  . Back pain   . DVT (deep venous thrombosis) ([place])   . Gait abnormality 05/23/2018  . Glaucoma   . Hypertension   . Insomnia   . Pulmonary embolism, bilateral [facility]) May 2012   on coumadin  . Skin cancer    forehead  . Skin cancer   .  Vertigo     Past Surgical History:  Procedure Laterality Date  . BACK SURGERY     x3  . EXCISION MASS NECK Right 01/19/2018   Procedure: Wide local excision of right neck skin cancer (5 cm);  Surgeon: Melida Quitter, MD;  Location: [facility];  Service: ENT;  Laterality: Right;  . RADICAL NECK DISSECTION Right 01/19/2018    Right modified radical neck dissection; Cervicopectoral flap (Right Neck)  . RADICAL NECK  DISSECTION Right 01/19/2018   Procedure: Right modified radical neck dissection; Cervicopectoral flap;  Surgeon: Melida Quitter, MD;  Location: [facility];  Service: ENT;  Laterality: Right;  . SHOULDER SURGERY     x3  Dr. Karrie Doffing al.  . TOTAL HIP ARTHROPLASTY  2012   Dr. Alvan Dame right hip  . TRANSTHORACIC ECHOCARDIOGRAM  07/26/2010   Left ventricle: The cavity size was normal. Wall thickness was increased in a pattern of moderate LVH. Systolic function was normal. The estimated ejection fraction was in the range of 55% to 60%    Social History:  reports that he quit smoking about 38 years ago. His smoking use included cigarettes. He has a 25.00 pack-year smoking history. He quit smokeless tobacco use about 11 years ago.  His smokeless tobacco use included chew. He reports current alcohol use. He reports that he does not use drugs.  Allergies  Allergen Reactions  . Codeine Nausea And Vomiting  . Morphine And Related Nausea And Vomiting    Family History  Problem Relation Age of Onset  . Stroke Father   . Alzheimer's disease Mother   . Lung cancer Brother      Prior to Admission medications   Medication Sig Start Date End Date Taking? Authorizing Provider  Ascorbic Acid (VITAMIN C) 1000 MG tablet Take 1,000 mg by mouth daily.      [provider]  aspirin 81 MG tablet Take 81 mg by mouth daily.     [provider]  cholecalciferol (VITAMIN D3) 25 MCG (1000 UT) tablet Take 1,000 Units by mouth daily.     [provider]  Copper Gluconate (COPPER CAPS PO) Take 2 mg by mouth.    [provider]  diphenhydrAMINE (BENADRYL) 50 MG capsule Take 50 mg by mouth daily.    [provider]  GLUCOSAMINE-CHONDROITIN PO Take 1 tablet by mouth daily.    [provider]  losartan (COZAAR) 100 MG tablet Take 1 tablet (100 mg total) by mouth daily. NEED OV. Patient taking differently: Take 50 mg by mouth daily. NEED OV. "pt states that he is taking half"  10/05/16   Croitoru, Dani Gobble, MD  meclizine (ANTIVERT) 25 MG tablet Take 25 mg by mouth every 6 (six) hours as needed for dizziness.     [provider]  mirtazapine (REMERON) 30 MG tablet Take 30 mg by mouth at bedtime. 01/13/15   [provider]  Red Yeast Rice 600 MG CAPS Take 1,200 mg by mouth daily.     [provider]  vitamin B-12 (CYANOCOBALAMIN) 1000 MCG tablet Take 1,000 mcg by mouth daily.    [provider]  vitamin E 400 UNIT capsule Take 400 Units by mouth daily.     [provider]    Physical Exam: Vitals:   04/24/19 1828 04/24/19 1830 04/24/19 1845 04/24/19 1900  BP:  (!) 157/85 (!) 152/73 (!) 147/81  Pulse:  91 76 (!) 103  Resp:  '20 19 20  ' Temp: 98.5 F (36.9 C)  TempSrc: Rectal     SpO2:  96% 93% 91%  Height:       Constitutional: Elderly man resting in bed with head elevated, appears fatigued, in in no acute distress Eyes: Right periorbital swelling and erythema, keeps right eye closed.  Left pupil round and reactive. ENMT: Mucous membranes are dry. Posterior pharynx clear of any exudate or lesions.Normal dentition.  Neck: normal, supple, no masses. Respiratory: clear to auscultation anteriorly. Normal respiratory effort. No accessory muscle use.  Cardiovascular: Regular rate and rhythm, no murmurs / rubs / gallops. No extremity edema.  Cool extremities with palpable pedal pulses. Abdomen: no tenderness, no masses palpated. No hepatosplenomegaly. Bowel sounds positive.  Musculoskeletal: no clubbing / cyanosis.  Moving all extremities however range of motion limited throughout, right greater than left. Skin: Right periorbital swelling/erythema Neurologic: Sensation intact, moving all extremities however strength diminished throughout-right greater than left.  Downgoing Babinski. Psychiatric: Awake, alert, and oriented to self.  He is able to tell me his brothers and sisters names.  He cannot tell me where he is or what year  it is.  He is unable to provide recent history.   Labs on Admission: I have personally reviewed following labs and imaging studies  CBC: Recent Labs  Lab 04/24/19 1550 04/24/19 1721  WBC 12.8*  --   NEUTROABS 10.6*  --   HGB 17.0 17.0  HCT 51.7 50.0  MCV 102.0*  --   PLT 224  --    Basic Metabolic Panel: Recent Labs  Lab 04/24/19 1550 04/24/19 1721  NA 144 144  K 4.1 4.2  CL 109 111  CO2 22  --   GLUCOSE 162* 150*  BUN 33* 38*  CREATININE 1.04 0.80  CALCIUM 8.6*  --    GFR: CrCl cannot be calculated (Unknown ideal weight.). Liver Function Tests: Recent Labs  Lab 04/24/19 1550  AST 93*  ALT 62*  ALKPHOS 71  BILITOT 1.6*  PROT 7.0  ALBUMIN 3.6   No results for input(s): LIPASE, AMYLASE in the last 168 hours. No results for input(s): AMMONIA in the last 168 hours. Coagulation Profile: Recent Labs  Lab 04/24/19 1550  INR 1.1   Cardiac Enzymes: Recent Labs  Lab 04/24/19 1550  CKTOTAL 2,166*   BNP (last 3 results) No results for input(s): PROBNP in the last 8760 hours. HbA1C: No results for input(s): HGBA1C in the last 72 hours. CBG: No results for input(s): GLUCAP in the last 168 hours. Lipid Profile: No results for input(s): CHOL, HDL, LDLCALC, TRIG, CHOLHDL, LDLDIRECT in the last 72 hours. Thyroid Function Tests: No results for input(s): TSH, T4TOTAL, FREET4, T3FREE, THYROIDAB in the last 72 hours. Anemia Panel: No results for input(s): VITAMINB12, FOLATE, FERRITIN, TIBC, IRON, RETICCTPCT in the last 72 hours. Urine analysis:    Component Value Date/Time   COLORURINE YELLOW 04/24/2019 1822   APPEARANCEUR CLEAR 04/24/2019 1822   LABSPEC 1.029 04/24/2019 1822   PHURINE 6.0 04/24/2019 1822   GLUCOSEU NEGATIVE 04/24/2019 1822   HGBUR SMALL (A) 04/24/2019 1822   BILIRUBINUR NEGATIVE 04/24/2019 1822   KETONESUR 80 (A) 04/24/2019 1822   PROTEINUR 30 (A) 04/24/2019 1822   UROBILINOGEN 0.2 04/01/2010 1230   NITRITE NEGATIVE 04/24/2019 1822    LEUKOCYTESUR NEGATIVE 04/24/2019 1822    Radiological Exams on Admission: DG Chest 1 View  Result Date: 04/24/2019 CLINICAL DATA:  86 year old male found on the floor. EXAM: CHEST  1 VIEW COMPARISON:  Chest radiograph dated 07/31/2010. FINDINGS: There is mild eventration of  the right hemidiaphragm. No focal consolidation, pleural effusion, or pneumothorax. Minimal left lung base atelectasis. The cardiac silhouette is within normal limits. Atherosclerotic calcification of the aorta. No acute osseous pathology. IMPRESSION: No active disease. Electronically Signed   By: Anner Crete M.D.   On: 04/24/2019 17:26   DG Pelvis 1-2 Views  Result Date: 04/24/2019 CLINICAL DATA:  Unwitnessed fall. Found on floor. EXAM: PELVIS - 1-2 VIEW COMPARISON:  None. FINDINGS: Right hip arthroplasty in expected alignment. No periprosthetic lucency or fracture. Bony pelvis is intact. No acute pelvic fracture. Postsurgical change in the lower lumbar spine is partially included. Mild left hip osteoarthritis. IMPRESSION: No pelvic fracture. Right hip arthroplasty in expected alignment without complication. Electronically Signed   By: Keith Rake M.D.   On: 04/24/2019 17:28   DG Elbow Complete Right  Result Date: 04/24/2019 CLINICAL DATA:  Unwitnessed fall. Found on the floor. EXAM: RIGHT ELBOW - COMPLETE 3+ VIEW COMPARISON:  None. FINDINGS: There is no evidence of fracture, dislocation, or joint effusion. Tiny olecranon spur. There is no evidence of arthropathy or other focal bone abnormality. Mild soft tissue edema. IMPRESSION: Mild soft tissue edema without osseous abnormality. Electronically Signed   By: Keith Rake M.D.   On: 04/24/2019 17:27   DG Wrist Complete Right  Result Date: 04/24/2019 CLINICAL DATA:  Unwitnessed fall. Found on floor. EXAM: RIGHT WRIST - COMPLETE 3+ VIEW COMPARISON:  None. FINDINGS: There is no evidence of fracture or dislocation. There is no evidence of arthropathy or other focal  bone abnormality. Generalized soft tissue edema. IMPRESSION: Soft tissue edema without acute osseous abnormality. Electronically Signed   By: Keith Rake M.D.   On: 04/24/2019 17:29   CT Head Wo Contrast  Result Date: 04/24/2019 CLINICAL DATA:  Facial trauma, altered mental status, uncertain cause EXAM: CT HEAD WITHOUT CONTRAST CT MAXILLOFACIAL WITHOUT CONTRAST CT CERVICAL SPINE WITHOUT CONTRAST TECHNIQUE: Multidetector CT imaging of the head, cervical spine, and maxillofacial structures were performed using the standard protocol without intravenous contrast. Multiplanar CT image reconstructions of the cervical spine and maxillofacial structures were also generated. COMPARISON:  MR brain, 06/01/2018 FINDINGS: CT HEAD FINDINGS Brain: No evidence of acute infarction, hemorrhage, hydrocephalus, extra-axial collection or mass lesion/mass effect. Extensive periventricular and deep white matter hypodensity, global volume loss, and enlargement of the lateral ventricles, likely ex vacuo. Vascular: No hyperdense vessel or unexpected calcification. CT FACIAL BONES FINDINGS Skull: Normal. Negative for fracture or focal lesion. Facial bones: Suspect nondisplaced fracture of the lateral and posterior walls of the right maxillary sinus (series 10, image 46). Sinuses/Orbits: No acute finding. Mucosal thickening in the maxillary sinuses and ethmoid air cells. No air-fluid levels. Other: Soft tissue contusion overlying the right forehead, right scalp, and right cheek. CT CERVICAL SPINE FINDINGS Alignment: Normal. Skull base and vertebrae: No acute fracture. No primary bone lesion or focal pathologic process. Soft tissues and spinal canal: No prevertebral fluid or swelling. No visible canal hematoma. Disc levels: Mild to moderate multilevel disc space height loss and osteophytosis. Upper chest: Negative. Other: Postoperative findings of right cervical dissection IMPRESSION: 1.  No acute intracranial pathology. 2. Extensive  small-vessel white matter disease and global severe volume loss. 3. Enlarged lateral ventricles, generally unchanged in appearance and configuration compared to prior examination, likely ex vacuo. 4. No displaced fractures of the facial bones. Suspect nondisplaced fracture of the lateral and posterior walls of the right maxillary sinus (series 10, image 46). 5. Soft tissue overlying the right forehead, right scalp, and right  cheek. 6.  No fracture or static subluxation of the cervical spine. 7.  Postoperative findings of right cervical dissection. Electronically Signed   By: Eddie Candle M.D.   On: 04/24/2019 18:07   CT Cervical Spine Wo Contrast  Result Date: 04/24/2019 CLINICAL DATA:  Facial trauma, altered mental status, uncertain cause EXAM: CT HEAD WITHOUT CONTRAST CT MAXILLOFACIAL WITHOUT CONTRAST CT CERVICAL SPINE WITHOUT CONTRAST TECHNIQUE: Multidetector CT imaging of the head, cervical spine, and maxillofacial structures were performed using the standard protocol without intravenous contrast. Multiplanar CT image reconstructions of the cervical spine and maxillofacial structures were also generated. COMPARISON:  MR brain, 06/01/2018 FINDINGS: CT HEAD FINDINGS Brain: No evidence of acute infarction, hemorrhage, hydrocephalus, extra-axial collection or mass lesion/mass effect. Extensive periventricular and deep white matter hypodensity, global volume loss, and enlargement of the lateral ventricles, likely ex vacuo. Vascular: No hyperdense vessel or unexpected calcification. CT FACIAL BONES FINDINGS Skull: Normal. Negative for fracture or focal lesion. Facial bones: Suspect nondisplaced fracture of the lateral and posterior walls of the right maxillary sinus (series 10, image 46). Sinuses/Orbits: No acute finding. Mucosal thickening in the maxillary sinuses and ethmoid air cells. No air-fluid levels. Other: Soft tissue contusion overlying the right forehead, right scalp, and right cheek. CT CERVICAL SPINE  FINDINGS Alignment: Normal. Skull base and vertebrae: No acute fracture. No primary bone lesion or focal pathologic process. Soft tissues and spinal canal: No prevertebral fluid or swelling. No visible canal hematoma. Disc levels: Mild to moderate multilevel disc space height loss and osteophytosis. Upper chest: Negative. Other: Postoperative findings of right cervical dissection IMPRESSION: 1.  No acute intracranial pathology. 2. Extensive small-vessel white matter disease and global severe volume loss. 3. Enlarged lateral ventricles, generally unchanged in appearance and configuration compared to prior examination, likely ex vacuo. 4. No displaced fractures of the facial bones. Suspect nondisplaced fracture of the lateral and posterior walls of the right maxillary sinus (series 10, image 46). 5. Soft tissue overlying the right forehead, right scalp, and right cheek. 6.  No fracture or static subluxation of the cervical spine. 7.  Postoperative findings of right cervical dissection. Electronically Signed   By: Eddie Candle M.D.   On: 04/24/2019 18:07   CT Maxillofacial WO CM  Result Date: 04/24/2019 CLINICAL DATA:  Facial trauma, altered mental status, uncertain cause EXAM: CT HEAD WITHOUT CONTRAST CT MAXILLOFACIAL WITHOUT CONTRAST CT CERVICAL SPINE WITHOUT CONTRAST TECHNIQUE: Multidetector CT imaging of the head, cervical spine, and maxillofacial structures were performed using the standard protocol without intravenous contrast. Multiplanar CT image reconstructions of the cervical spine and maxillofacial structures were also generated. COMPARISON:  MR brain, 06/01/2018 FINDINGS: CT HEAD FINDINGS Brain: No evidence of acute infarction, hemorrhage, hydrocephalus, extra-axial collection or mass lesion/mass effect. Extensive periventricular and deep white matter hypodensity, global volume loss, and enlargement of the lateral ventricles, likely ex vacuo. Vascular: No hyperdense vessel or unexpected calcification.  CT FACIAL BONES FINDINGS Skull: Normal. Negative for fracture or focal lesion. Facial bones: Suspect nondisplaced fracture of the lateral and posterior walls of the right maxillary sinus (series 10, image 46). Sinuses/Orbits: No acute finding. Mucosal thickening in the maxillary sinuses and ethmoid air cells. No air-fluid levels. Other: Soft tissue contusion overlying the right forehead, right scalp, and right cheek. CT CERVICAL SPINE FINDINGS Alignment: Normal. Skull base and vertebrae: No acute fracture. No primary bone lesion or focal pathologic process. Soft tissues and spinal canal: No prevertebral fluid or swelling. No visible canal hematoma. Disc levels: Mild to moderate  multilevel disc space height loss and osteophytosis. Upper chest: Negative. Other: Postoperative findings of right cervical dissection IMPRESSION: 1.  No acute intracranial pathology. 2. Extensive small-vessel white matter disease and global severe volume loss. 3. Enlarged lateral ventricles, generally unchanged in appearance and configuration compared to prior examination, likely ex vacuo. 4. No displaced fractures of the facial bones. Suspect nondisplaced fracture of the lateral and posterior walls of the right maxillary sinus (series 10, image 46). 5. Soft tissue overlying the right forehead, right scalp, and right cheek. 6.  No fracture or static subluxation of the cervical spine. 7.  Postoperative findings of right cervical dissection. Electronically Signed   By: Eddie Candle M.D.   On: 04/24/2019 18:07    EKG: Independently reviewed. Sinus rhythm, RBBB and LAFB, motion artifact.  Assessment/Plan Principal Problem:   Fall Active Problems:   Gait abnormality   Essential hypertension   History of pulmonary embolism   Right maxillary fracture (HCC)  [name] is a 86 y.o. male with medical history significant for history of PE in 2012 (on aspirin alone after completing 6 months of Coumadin), hypertension, and gait disorder  who is admitted after fall at home with questionable syncope.  Fall at home/? Syncope: Unclear etiology/circumstances regarding fall or time down.  Imaging shows suspected nondisplaced right maxillary fracture but no other significant injury.  He has known gait disorder for which he follows with neurology suggesting a mechanical fall.  No obvious infectious etiology.  He does appear dehydrated.  He does have altered mental status on admission and generalized weakness in all extremities although appears more pronounced on the right side compared to left. -Monitor on telemetry -Obtain echocardiogram -Obtain MRI brain to assess for CVA -Check orthostatic vital signs -Continue IV fluid hydration overnight -PT/OT evaluation  Elevated CK: CK mildly elevated in setting of fall with prolonged downtime.  Renal function is stable.  Continue IV fluid hydration overnight and follow labs.  Nondisplaced right maxillary fracture: Seen on CT imaging.  EDP discussed with on-call ENT who recommended no further intervention, follow-up, or antibiotics necessary.  Gait disorder: Chronic, following with neurology.  History of B12 and copper deficiencies, on supplementation with improving levels.  Will continue B12 and copper supplements.  History of PE: 2012, completed 6 months of Coumadin therapy and now on aspirin alone.  Will hold aspirin for now.  Hypertension: Losartan on hold for now.  DVT prophylaxis: SCDs Code Status: Full code Family Communication: Attempted to reach sister at number listed under demographics without answer. Disposition Plan: From the home, disposition pending improvement in mental status towards baseline, further evaluation of fall/syncope, and PT/OT recommendations. Consults called: EDP discussed with on-call ENT Admission status: Observation   Zada Finders MD [facility]  If 7PM-7AM, please contact night-coverage [email or web address]  04/24/2019, 7:54 PM

## 2019-04-24 NOTE — ED Notes (Addendum)
Visual acuity check was set up in the room but pt did not cooperate or follow instructions. Pt's right eye remains closed while left eye opens.

## 2019-04-24 NOTE — ED Notes (Signed)
SisterTamela Oddi [phone] would like an update

## 2019-04-24 NOTE — ED Notes (Signed)
Pt transported to xray 

## 2019-04-24 NOTE — ED Triage Notes (Signed)
Pt here from home found on the floor with a welfare check , pt doesn't remember what happen ,.pt has redness to the right side of his body

## 2019-04-24 NOTE — ED Notes (Signed)
Patient taken to MRI at this time 

## 2019-04-24 NOTE — ED Provider Notes (Signed)
[facility] EMERGENCY DEPARTMENT Provider Note   CSN: [id number] Arrival date & time: 04/24/19  1544     History No chief complaint on file.  Level 5 caveat due to altered mental status JEANMICHAEL Simon is a 86 y.o. male with history of DVT and PE, hypertension, skin cancer presents brought in by EMS for evaluation after fall.  Per EMS report patient's neighbors have been attempting to get a hold of the patient for the last 4 days and have been knocking on his door but he has not been answering.  They report his truck has been in the driveway and has not moved and so today they called out for a welfare check.  Patient was found on the floor.  He thinks he has only been on the floor for a couple of hours but does not know how he got on the floor.  He is complaining of some pain to the right side of the head and right upper extremity.  He is confused.  He does know he is in a hospital.  He was oriented to the month with our nurse but cannot tell me the month and thinks that the year is 1966.  He cannot tell me who the President of the [place] is.  He tells me that he is on a blood thinner but does not know which one.  Record review shows he is only on a baby aspirin.  The history is provided by the patient and the EMS personnel.       Past Medical History:  Diagnosis Date  . Back pain   . DVT (deep venous thrombosis) ([place])   . Gait abnormality 05/23/2018  . Glaucoma   . Hypertension   . Insomnia   . Pulmonary embolism, bilateral [facility]) May 2012   on coumadin  . Skin cancer    forehead  . Skin cancer   . Vertigo     Patient Active Problem List   Diagnosis Date Noted  . Fall 04/24/2019  . Essential hypertension 04/24/2019  . History of pulmonary embolism 04/24/2019  . Right maxillary fracture ([place]) 04/24/2019  . Elevated CK 04/24/2019  . Dizziness 09/26/2018  . Gait abnormality 05/23/2018  . Skin cancer of scalp or skin of neck 01/19/2018  . Abnormal chest  x-ray 02/17/2011  . Osteoarthritis 11/18/2010  . Pulmonary embolism ([place]) 08/15/2010  . Chronic anticoagulation 08/15/2010  . Lung nodule 08/15/2010  . Cor pulmonale ([place]) 08/15/2010  . Dyspnea 07/25/2010  . Glaucoma   . Back pain   . Skin cancer   . Insomnia     Past Surgical History:  Procedure Laterality Date  . BACK SURGERY     x3  . EXCISION MASS NECK Right 01/19/2018   Procedure: Wide local excision of right neck skin cancer (5 cm);  Surgeon: Melida Quitter, MD;  Location: [place];  Service: ENT;  Laterality: Right;  . RADICAL NECK DISSECTION Right 01/19/2018    Right modified radical neck dissection; Cervicopectoral flap (Right Neck)  . RADICAL NECK DISSECTION Right 01/19/2018   Procedure: Right modified radical neck dissection; Cervicopectoral flap;  Surgeon: Melida Quitter, MD;  Location: [place];  Service: ENT;  Laterality: Right;  . SHOULDER SURGERY     x3  Dr. Karrie Doffing al.  . TOTAL HIP ARTHROPLASTY  2012   Dr. Alvan Dame right hip  . TRANSTHORACIC ECHOCARDIOGRAM  07/26/2010   Left ventricle: The cavity size was normal. Wall thickness was increased in a  pattern of moderate LVH. Systolic function was normal. The estimated ejection fraction was in the range of 55% to 60%       Family History  Problem Relation Age of Onset  . Stroke Father   . Alzheimer's disease Mother   . Lung cancer Brother     Social History   Tobacco Use  . Smoking status: Former Smoker    Packs/day: 1.00    Years: 25.00    Pack years: 25.00    Types: Cigarettes    Quit date: 03/02/1981    Years since quitting: 38.1  . Smokeless tobacco: Former Systems developer    Types: Chew    Quit date: 03/02/2008  Substance Use Topics  . Alcohol use: Yes    Comment: wine 3-4 times a week  . Drug use: No    Home Medications Prior to Admission medications   Medication Sig Start Date End Date Taking? Authorizing Provider  Ascorbic Acid (VITAMIN C) 1000 MG tablet Take 1,000 mg by mouth daily.     Yes [provider]  aspirin EC 81 MG tablet Take 81 mg by mouth daily.   Yes [provider]  cholecalciferol (VITAMIN D3) 25 MCG (1000 UT) tablet Take 1,000 Units by mouth daily.    Yes [provider]  Copper Gluconate (COPPER CAPS) 2 MG CAPS Take 2 mg by mouth daily.    Yes [provider]  diazepam (VALIUM) 2 MG tablet Take 2 mg by mouth 2 (two) times daily.   Yes [provider]  diphenhydrAMINE (BENADRYL) 25 MG tablet Take 25 mg by mouth daily as needed (runny nose).    Yes [provider]  GLUCOSAMINE-CHONDROITIN PO Take 1 tablet by mouth daily.   Yes [provider]  ibuprofen (ADVIL) 200 MG tablet Take 800 mg by mouth daily as needed for headache (pain).   Yes [provider]  latanoprost (XALATAN) 0.005 % ophthalmic solution Place 1 drop into both eyes at bedtime.   Yes [provider]  losartan (COZAAR) 100 MG tablet Take 1 tablet (100 mg total) by mouth daily. NEED OV. Patient taking differently: Take 50 mg by mouth at bedtime.  10/05/16  Yes Croitoru, Mihai, MD  mirtazapine (REMERON) 30 MG tablet Take 30 mg by mouth at bedtime. 01/13/15  Yes [provider]  Red Yeast Rice 600 MG CAPS Take 1,200 mg by mouth daily.    Yes [provider]  vitamin B-12 (CYANOCOBALAMIN) 1000 MCG tablet Take 1,000 mcg by mouth daily.   Yes [provider]  vitamin E 400 UNIT capsule Take 400 Units by mouth daily.    Yes [provider]    Allergies    Codeine and Morphine and related  Review of Systems   Review of Systems  Unable to perform ROS: Mental status change    Physical Exam Updated Vital Signs BP 125/84 (BP Location: Left Arm)   Pulse 73   Temp 98.2 F (36.8 C) (Oral)   Resp 18   Ht 5\' 10"  (1.778 m)   SpO2 97%   BMI 31.57 kg/m   Physical Exam Vitals and nursing note reviewed.  Constitutional:      General: He is not in acute distress.    Appearance: He is well-developed.    HENT:     Head: Normocephalic.     Comments: Right-sided facial swelling, most impressively in the right periorbital region.  He has tenderness to palpation along the right orbit.  Dentition  appears stable.     Mouth/Throat:     Mouth: Mucous membranes are dry.  Eyes:     Pupils: Pupils are equal, round, and reactive to light.     Comments: Right eye with injected conjunctive a.  No chemosis or proptosis or consensual photophobia.  He has restriction with EOMs of the right eye but denies pain.  Neck:     Vascular: No JVD.     Trachea: No tracheal deviation.     Comments: c-collar in place Cardiovascular:     Rate and Rhythm: Normal rate and regular rhythm.     Comments: Extremities are somewhat cool to the touch but he has palpable radial and DP pulses bilaterally.  No lower extremity edema. Pulmonary:     Effort: Pulmonary effort is normal.     Breath sounds: Normal breath sounds.  Chest:     Chest wall: No tenderness.  Abdominal:     General: Bowel sounds are normal. There is no distension.     Palpations: Abdomen is soft.     Tenderness: There is no abdominal tenderness. There is no guarding.  Musculoskeletal:        General: Tenderness present.     Comments:   To some swelling and tenderness to palpation of the dorsum of the right wrist and medial and lateral processes of the left elbow.  Somewhat limited passive range of motion.  Pelvis appears stable.  Superficial abrasions and ecchymosis noted to all of the extremities.  Skin:    General: Skin is warm and dry.     Findings: Bruising present. No erythema.  Neurological:     Mental Status: He is alert.     Comments: Confused but speech is fluent and generally goal oriented.  He moves all extremities spontaneously but has some limitation on examination of the left upper and lower extremities.  Sensation intact to light touch of face and extremities.  Cranial nerves appear grossly intact.  Psychiatric:        Behavior:  Behavior normal.     ED Results / Procedures / Treatments   Labs (all labs ordered are listed, but only abnormal results are displayed) Labs Reviewed  LACTIC ACID, PLASMA - Abnormal; Notable for the following components:      Result Value   Lactic Acid, Venous 2.5 (*)    All other components within normal limits  LACTIC ACID, PLASMA - Abnormal; Notable for the following components:   Lactic Acid, Venous 2.0 (*)    All other components within normal limits  COMPREHENSIVE METABOLIC PANEL - Abnormal; Notable for the following components:   Glucose, Bld 162 (*)    BUN 33 (*)    Calcium 8.6 (*)    AST 93 (*)    ALT 62 (*)    Total Bilirubin 1.6 (*)    All other components within normal limits  CBC WITH DIFFERENTIAL/PLATELET - Abnormal; Notable for the following components:   WBC 12.8 (*)    MCV 102.0 (*)    Neutro Abs 10.6 (*)    Monocytes Absolute 1.1 (*)    All other components within normal limits  APTT - Abnormal; Notable for the following components:   aPTT 22 (*)    All other components within normal limits  URINALYSIS, ROUTINE W REFLEX MICROSCOPIC - Abnormal; Notable for the following components:   Hgb urine dipstick SMALL (*)    Ketones, ur 80 (*)    Protein, ur 30 (*)    All  other components within normal limits  CK - Abnormal; Notable for the following components:   Total CK 2,166 (*)    All other components within normal limits  I-STAT CHEM 8, ED - Abnormal; Notable for the following components:   BUN 38 (*)    Glucose, Bld 150 (*)    Calcium, Ion 1.03 (*)    All other components within normal limits  SARS CORONAVIRUS 2 (TAT 6-24 HRS)  CULTURE, BLOOD (ROUTINE X 2)  CULTURE, BLOOD (ROUTINE X 2)  URINE CULTURE  PROTIME-INR  CBC  BASIC METABOLIC PANEL  CK    EKG EKG Interpretation  Date/Time:  Monday April 24 2019 18:05:17 EST Ventricular Rate:  91 PR Interval:    QRS Duration: 122 QT Interval:  364 QTC Calculation: 448 R Axis:   -60 Text  Interpretation: poor data quality, probable sinus rhythm Ventricular premature complex RBBB and LAFB Borderline ST elevation, lateral leads Confirmed by Dorie Rank [phone]) on 04/24/2019 6:15:23 PM   Radiology DG Chest 1 View  Result Date: 04/24/2019 CLINICAL DATA:  86 year old male found on the floor. EXAM: CHEST  1 VIEW COMPARISON:  Chest radiograph dated 07/31/2010. FINDINGS: There is mild eventration of the right hemidiaphragm. No focal consolidation, pleural effusion, or pneumothorax. Minimal left lung base atelectasis. The cardiac silhouette is within normal limits. Atherosclerotic calcification of the aorta. No acute osseous pathology. IMPRESSION: No active disease. Electronically Signed   By: Anner Crete M.D.   On: 04/24/2019 17:26   DG Pelvis 1-2 Views  Result Date: 04/24/2019 CLINICAL DATA:  Unwitnessed fall. Found on floor. EXAM: PELVIS - 1-2 VIEW COMPARISON:  None. FINDINGS: Right hip arthroplasty in expected alignment. No periprosthetic lucency or fracture. Bony pelvis is intact. No acute pelvic fracture. Postsurgical change in the lower lumbar spine is partially included. Mild left hip osteoarthritis. IMPRESSION: No pelvic fracture. Right hip arthroplasty in expected alignment without complication. Electronically Signed   By: Keith Rake M.D.   On: 04/24/2019 17:28   DG Elbow Complete Right  Result Date: 04/24/2019 CLINICAL DATA:  Unwitnessed fall. Found on the floor. EXAM: RIGHT ELBOW - COMPLETE 3+ VIEW COMPARISON:  None. FINDINGS: There is no evidence of fracture, dislocation, or joint effusion. Tiny olecranon spur. There is no evidence of arthropathy or other focal bone abnormality. Mild soft tissue edema. IMPRESSION: Mild soft tissue edema without osseous abnormality. Electronically Signed   By: Keith Rake M.D.   On: 04/24/2019 17:27   DG Wrist Complete Right  Result Date: 04/24/2019 CLINICAL DATA:  Unwitnessed fall. Found on floor. EXAM: RIGHT WRIST - COMPLETE 3+  VIEW COMPARISON:  None. FINDINGS: There is no evidence of fracture or dislocation. There is no evidence of arthropathy or other focal bone abnormality. Generalized soft tissue edema. IMPRESSION: Soft tissue edema without acute osseous abnormality. Electronically Signed   By: Keith Rake M.D.   On: 04/24/2019 17:29   CT Head Wo Contrast  Result Date: 04/24/2019 CLINICAL DATA:  Facial trauma, altered mental status, uncertain cause EXAM: CT HEAD WITHOUT CONTRAST CT MAXILLOFACIAL WITHOUT CONTRAST CT CERVICAL SPINE WITHOUT CONTRAST TECHNIQUE: Multidetector CT imaging of the head, cervical spine, and maxillofacial structures were performed using the standard protocol without intravenous contrast. Multiplanar CT image reconstructions of the cervical spine and maxillofacial structures were also generated. COMPARISON:  MR brain, 06/01/2018 FINDINGS: CT HEAD FINDINGS Brain: No evidence of acute infarction, hemorrhage, hydrocephalus, extra-axial collection or mass lesion/mass effect. Extensive periventricular and deep white matter hypodensity, global volume loss, and enlargement  of the lateral ventricles, likely ex vacuo. Vascular: No hyperdense vessel or unexpected calcification. CT FACIAL BONES FINDINGS Skull: Normal. Negative for fracture or focal lesion. Facial bones: Suspect nondisplaced fracture of the lateral and posterior walls of the right maxillary sinus (series 10, image 46). Sinuses/Orbits: No acute finding. Mucosal thickening in the maxillary sinuses and ethmoid air cells. No air-fluid levels. Other: Soft tissue contusion overlying the right forehead, right scalp, and right cheek. CT CERVICAL SPINE FINDINGS Alignment: Normal. Skull base and vertebrae: No acute fracture. No primary bone lesion or focal pathologic process. Soft tissues and spinal canal: No prevertebral fluid or swelling. No visible canal hematoma. Disc levels: Mild to moderate multilevel disc space height loss and osteophytosis. Upper  chest: Negative. Other: Postoperative findings of right cervical dissection IMPRESSION: 1.  No acute intracranial pathology. 2. Extensive small-vessel white matter disease and global severe volume loss. 3. Enlarged lateral ventricles, generally unchanged in appearance and configuration compared to prior examination, likely ex vacuo. 4. No displaced fractures of the facial bones. Suspect nondisplaced fracture of the lateral and posterior walls of the right maxillary sinus (series 10, image 46). 5. Soft tissue overlying the right forehead, right scalp, and right cheek. 6.  No fracture or static subluxation of the cervical spine. 7.  Postoperative findings of right cervical dissection. Electronically Signed   By: Eddie Candle M.D.   On: 04/24/2019 18:07   CT Cervical Spine Wo Contrast  Result Date: 04/24/2019 CLINICAL DATA:  Facial trauma, altered mental status, uncertain cause EXAM: CT HEAD WITHOUT CONTRAST CT MAXILLOFACIAL WITHOUT CONTRAST CT CERVICAL SPINE WITHOUT CONTRAST TECHNIQUE: Multidetector CT imaging of the head, cervical spine, and maxillofacial structures were performed using the standard protocol without intravenous contrast. Multiplanar CT image reconstructions of the cervical spine and maxillofacial structures were also generated. COMPARISON:  MR brain, 06/01/2018 FINDINGS: CT HEAD FINDINGS Brain: No evidence of acute infarction, hemorrhage, hydrocephalus, extra-axial collection or mass lesion/mass effect. Extensive periventricular and deep white matter hypodensity, global volume loss, and enlargement of the lateral ventricles, likely ex vacuo. Vascular: No hyperdense vessel or unexpected calcification. CT FACIAL BONES FINDINGS Skull: Normal. Negative for fracture or focal lesion. Facial bones: Suspect nondisplaced fracture of the lateral and posterior walls of the right maxillary sinus (series 10, image 46). Sinuses/Orbits: No acute finding. Mucosal thickening in the maxillary sinuses and ethmoid  air cells. No air-fluid levels. Other: Soft tissue contusion overlying the right forehead, right scalp, and right cheek. CT CERVICAL SPINE FINDINGS Alignment: Normal. Skull base and vertebrae: No acute fracture. No primary bone lesion or focal pathologic process. Soft tissues and spinal canal: No prevertebral fluid or swelling. No visible canal hematoma. Disc levels: Mild to moderate multilevel disc space height loss and osteophytosis. Upper chest: Negative. Other: Postoperative findings of right cervical dissection IMPRESSION: 1.  No acute intracranial pathology. 2. Extensive small-vessel white matter disease and global severe volume loss. 3. Enlarged lateral ventricles, generally unchanged in appearance and configuration compared to prior examination, likely ex vacuo. 4. No displaced fractures of the facial bones. Suspect nondisplaced fracture of the lateral and posterior walls of the right maxillary sinus (series 10, image 46). 5. Soft tissue overlying the right forehead, right scalp, and right cheek. 6.  No fracture or static subluxation of the cervical spine. 7.  Postoperative findings of right cervical dissection. Electronically Signed   By: Eddie Candle M.D.   On: 04/24/2019 18:07   MR BRAIN WO CONTRAST  Result Date: 04/24/2019 CLINICAL DATA:  Initial evaluation  for acute encephalopathy, found down. EXAM: MRI HEAD WITHOUT CONTRAST TECHNIQUE: Multiplanar, multiecho pulse sequences of the brain and surrounding structures were obtained without intravenous contrast. COMPARISON:  Comparison made with prior CT from earlier same day. FINDINGS: Brain: Diffuse prominence of the CSF containing spaces compatible generalized age-related cerebral atrophy. Minimal T2/FLAIR hyperintensity within the periventricular white matter, nonspecific, but most likely related chronic microvascular ischemic disease, felt to be within normal limits for age. Small remote lacunar infarcts present at the right caudate, right thalamus,  and posterior left subcortical white matter. Associated small chronic microhemorrhage noted about the left-sided chronic lacunar infarct (series 14, image 35). No abnormal foci of restricted diffusion to suggest acute or subacute ischemia. Gray-white matter differentiation maintained. No encephalomalacia to suggest chronic cortical infarction. No other evidence for acute or chronic intracranial hemorrhage. No mass lesion, midline shift or mass effect. Diffuse ventricular prominence, not significantly changed as compared to previous, most likely related to global parenchymal volume loss. No evidence for transependymal flow of CSF. No extra-axial fluid collection. Pituitary gland suprasellar region normal. Midline structures intact. Vascular: Major intracranial vascular flow voids are maintained. Skull and upper cervical spine: Craniocervical junction within normal limits. Upper cervical spine unremarkable. Bone marrow signal intensity within normal limits. Soft tissue contusion/edema seen involving the right frontotemporal scalp. Sinuses/Orbits: Patient status post bilateral ocular lens replacement. Scattered mucosal thickening noted throughout the paranasal sinuses. Superimposed small retention cyst noted within the left maxillary sinus. No significant mastoid effusion. Inner ear structures grossly normal. Other: None. IMPRESSION: 1. No acute intracranial abnormality. 2. Soft tissue contusion involving the right frontotemporal scalp. 3. Age-related cerebral atrophy with small remote lacunar infarcts involving the right caudate, right thalamus, and subcortical left parietal lobe. 4. Diffuse ventricular prominence, similar to previous, and most likely related to global parenchymal volume loss. Electronically Signed   By: Jeannine Boga M.D.   On: 04/24/2019 22:52   CT Maxillofacial WO CM  Result Date: 04/24/2019 CLINICAL DATA:  Facial trauma, altered mental status, uncertain cause EXAM: CT HEAD WITHOUT  CONTRAST CT MAXILLOFACIAL WITHOUT CONTRAST CT CERVICAL SPINE WITHOUT CONTRAST TECHNIQUE: Multidetector CT imaging of the head, cervical spine, and maxillofacial structures were performed using the standard protocol without intravenous contrast. Multiplanar CT image reconstructions of the cervical spine and maxillofacial structures were also generated. COMPARISON:  MR brain, 06/01/2018 FINDINGS: CT HEAD FINDINGS Brain: No evidence of acute infarction, hemorrhage, hydrocephalus, extra-axial collection or mass lesion/mass effect. Extensive periventricular and deep white matter hypodensity, global volume loss, and enlargement of the lateral ventricles, likely ex vacuo. Vascular: No hyperdense vessel or unexpected calcification. CT FACIAL BONES FINDINGS Skull: Normal. Negative for fracture or focal lesion. Facial bones: Suspect nondisplaced fracture of the lateral and posterior walls of the right maxillary sinus (series 10, image 46). Sinuses/Orbits: No acute finding. Mucosal thickening in the maxillary sinuses and ethmoid air cells. No air-fluid levels. Other: Soft tissue contusion overlying the right forehead, right scalp, and right cheek. CT CERVICAL SPINE FINDINGS Alignment: Normal. Skull base and vertebrae: No acute fracture. No primary bone lesion or focal pathologic process. Soft tissues and spinal canal: No prevertebral fluid or swelling. No visible canal hematoma. Disc levels: Mild to moderate multilevel disc space height loss and osteophytosis. Upper chest: Negative. Other: Postoperative findings of right cervical dissection IMPRESSION: 1.  No acute intracranial pathology. 2. Extensive small-vessel white matter disease and global severe volume loss. 3. Enlarged lateral ventricles, generally unchanged in appearance and configuration compared to prior examination, likely ex vacuo. 4.  No displaced fractures of the facial bones. Suspect nondisplaced fracture of the lateral and posterior walls of the right maxillary  sinus (series 10, image 46). 5. Soft tissue overlying the right forehead, right scalp, and right cheek. 6.  No fracture or static subluxation of the cervical spine. 7.  Postoperative findings of right cervical dissection. Electronically Signed   By: Eddie Candle M.D.   On: 04/24/2019 18:07    Procedures Procedures (including critical care time)  Medications Ordered in ED Medications  fluorescein ophthalmic strip 1 strip (has no administration in time range)  tetracaine (PONTOCAINE) 0.5 % ophthalmic solution 1 drop (has no administration in time range)  sodium chloride flush (NS) 0.9 % injection 3 mL (3 mLs Intravenous Given 04/24/19 2348)  0.9 %  sodium chloride infusion ( Intravenous New Bag/Given 04/24/19 2347)  acetaminophen (TYLENOL) tablet 650 mg (has no administration in time range)    Or  acetaminophen (TYLENOL) suppository 650 mg (has no administration in time range)  vitamin B-12 (CYANOCOBALAMIN) tablet 1,000 mcg (has no administration in time range)  diltiazem (CARDIZEM) injection 10 mg (has no administration in time range)  sodium chloride 0.9 % bolus 1,000 mL (0 mLs Intravenous Stopped 04/24/19 2141)    ED Course  I have reviewed the triage vital signs and the nursing notes.  Pertinent labs & imaging results that were available during my care of the patient were reviewed by me and considered in my medical decision making (see chart for details).  MDM Rules/Calculators/A&P                      Patient presents for evaluation brought in by EMS after reportedly being down in his home for up to 4 days, possibly longer.  He is afebrile, intermittently tachycardic and tachypneic in the ED.  He is confused, exhibits generalized weakness, swelling to the right side of the body particularly around the right orbit.  Lab work reviewed and interpreted by myself notable for leukocytosis and elevated lactic acid level; suspect this is likely in the setting of profound dehydration and  physiologic stress.  UA also suggest dehydration.  His CK level is elevated which is to be expected in the setting of prolonged immobilization.  No metabolic derangements.  His BUN and creatinine are a bit elevated above baseline.  He was given IV fluids in the ED with improvement in his lactic acid level.  Imaging today significant for likely nondisplaced fracture of the lateral and posterior walls of the right maxillary sinus.  No other acute abnormality.  No clear infectious source.  Suspect his lab abnormalities are likely in the setting of dehydration after this fall, less likely in the setting of sepsis.  CONSULT: Spoke with Dr. Conley Simmonds with ENT trauma; he reviewed the patient's images independently advises the patient does not require any additional follow-up or antibiotics.  Spoke with Dr. Posey Pronto with [facility] hospitalist service who agrees to assume care of patient and bring him into the hospital for further evaluation and management.    Final Clinical Impression(s) / ED Diagnoses Final diagnoses:  Unwitnessed fall  Elevated CK  Elevated lactic acid level  Closed fracture of maxillary sinus, initial encounter [facility])    Rx / [place] Orders ED Discharge Orders    None       Debroah Baller 04/25/19 Alvira Philips, MD 04/25/19 [id number]

## 2019-04-25 ENCOUNTER — Observation Stay (HOSPITAL_BASED_OUTPATIENT_CLINIC_OR_DEPARTMENT_OTHER): Payer: Medicare Other

## 2019-04-25 ENCOUNTER — Observation Stay (HOSPITAL_COMMUNITY): Payer: Medicare Other

## 2019-04-25 ENCOUNTER — Encounter (HOSPITAL_COMMUNITY): Payer: Self-pay | Admitting: Internal Medicine

## 2019-04-25 DIAGNOSIS — Z86711 Personal history of pulmonary embolism: Secondary | ICD-10-CM | POA: Diagnosis not present

## 2019-04-25 DIAGNOSIS — R748 Abnormal levels of other serum enzymes: Secondary | ICD-10-CM

## 2019-04-25 DIAGNOSIS — Y92009 Unspecified place in unspecified non-institutional (private) residence as the place of occurrence of the external cause: Secondary | ICD-10-CM | POA: Diagnosis not present

## 2019-04-25 DIAGNOSIS — R55 Syncope and collapse: Secondary | ICD-10-CM | POA: Diagnosis not present

## 2019-04-25 DIAGNOSIS — Z87891 Personal history of nicotine dependence: Secondary | ICD-10-CM | POA: Diagnosis not present

## 2019-04-25 DIAGNOSIS — I480 Paroxysmal atrial fibrillation: Secondary | ICD-10-CM | POA: Diagnosis present

## 2019-04-25 DIAGNOSIS — R930 Abnormal findings on diagnostic imaging of skull and head, not elsewhere classified: Secondary | ICD-10-CM | POA: Diagnosis not present

## 2019-04-25 DIAGNOSIS — R7989 Other specified abnormal findings of blood chemistry: Secondary | ICD-10-CM | POA: Diagnosis present

## 2019-04-25 DIAGNOSIS — W1830XA Fall on same level, unspecified, initial encounter: Secondary | ICD-10-CM | POA: Diagnosis present

## 2019-04-25 DIAGNOSIS — R269 Unspecified abnormalities of gait and mobility: Secondary | ICD-10-CM

## 2019-04-25 DIAGNOSIS — W19XXXA Unspecified fall, initial encounter: Secondary | ICD-10-CM | POA: Diagnosis not present

## 2019-04-25 DIAGNOSIS — Z8589 Personal history of malignant neoplasm of other organs and systems: Secondary | ICD-10-CM | POA: Diagnosis not present

## 2019-04-25 DIAGNOSIS — M6282 Rhabdomyolysis: Secondary | ICD-10-CM | POA: Diagnosis present

## 2019-04-25 DIAGNOSIS — S0240CA Maxillary fracture, right side, initial encounter for closed fracture: Secondary | ICD-10-CM | POA: Diagnosis present

## 2019-04-25 DIAGNOSIS — Z96641 Presence of right artificial hip joint: Secondary | ICD-10-CM | POA: Diagnosis present

## 2019-04-25 DIAGNOSIS — I10 Essential (primary) hypertension: Secondary | ICD-10-CM | POA: Diagnosis present

## 2019-04-25 DIAGNOSIS — Z86718 Personal history of other venous thrombosis and embolism: Secondary | ICD-10-CM | POA: Diagnosis not present

## 2019-04-25 DIAGNOSIS — R739 Hyperglycemia, unspecified: Secondary | ICD-10-CM | POA: Diagnosis present

## 2019-04-25 DIAGNOSIS — F419 Anxiety disorder, unspecified: Secondary | ICD-10-CM | POA: Diagnosis present

## 2019-04-25 DIAGNOSIS — Z20822 Contact with and (suspected) exposure to covid-19: Secondary | ICD-10-CM | POA: Diagnosis present

## 2019-04-25 DIAGNOSIS — Z85828 Personal history of other malignant neoplasm of skin: Secondary | ICD-10-CM | POA: Diagnosis not present

## 2019-04-25 DIAGNOSIS — Z79899 Other long term (current) drug therapy: Secondary | ICD-10-CM | POA: Diagnosis not present

## 2019-04-25 DIAGNOSIS — S0240CD Maxillary fracture, right side, subsequent encounter for fracture with routine healing: Secondary | ICD-10-CM

## 2019-04-25 DIAGNOSIS — E870 Hyperosmolality and hypernatremia: Secondary | ICD-10-CM | POA: Diagnosis present

## 2019-04-25 DIAGNOSIS — E860 Dehydration: Secondary | ICD-10-CM | POA: Diagnosis present

## 2019-04-25 DIAGNOSIS — I4891 Unspecified atrial fibrillation: Secondary | ICD-10-CM | POA: Diagnosis not present

## 2019-04-25 DIAGNOSIS — Z7982 Long term (current) use of aspirin: Secondary | ICD-10-CM | POA: Diagnosis not present

## 2019-04-25 DIAGNOSIS — F329 Major depressive disorder, single episode, unspecified: Secondary | ICD-10-CM | POA: Diagnosis present

## 2019-04-25 LAB — ECHOCARDIOGRAM COMPLETE: Height: 70 in

## 2019-04-25 LAB — CBC
HCT: 45.9 % (ref 39.0–52.0)
Hemoglobin: 15.1 g/dL (ref 13.0–17.0)
MCH: 33.3 pg (ref 26.0–34.0)
MCHC: 32.9 g/dL (ref 30.0–36.0)
MCV: 101.3 fL — ABNORMAL HIGH (ref 80.0–100.0)
Platelets: 185 10*3/uL (ref 150–400)
RBC: 4.53 MIL/uL (ref 4.22–5.81)
RDW: 13.2 % (ref 11.5–15.5)
WBC: 9.9 10*3/uL (ref 4.0–10.5)
nRBC: 0 % (ref 0.0–0.2)

## 2019-04-25 LAB — URINE CULTURE: Culture: NO GROWTH

## 2019-04-25 LAB — TROPONIN I (HIGH SENSITIVITY)
Troponin I (High Sensitivity): 37 ng/L — ABNORMAL HIGH (ref <18)
Troponin I (High Sensitivity): 36 ng/L — ABNORMAL HIGH (ref <18)

## 2019-04-25 LAB — BASIC METABOLIC PANEL
Anion gap: 12 (ref 5–15)
BUN: 29 mg/dL — ABNORMAL HIGH (ref 8–23)
CO2: 23 mmol/L (ref 22–32)
Calcium: 8.1 mg/dL — ABNORMAL LOW (ref 8.9–10.3)
Chloride: 111 mmol/L (ref 98–111)
Creatinine, Ser: 0.86 mg/dL (ref 0.61–1.24)
GFR calc Af Amer: >60 mL/min (ref >60)
GFR calc non Af Amer: >60 mL/min (ref >60)
Glucose, Bld: 143 mg/dL — ABNORMAL HIGH (ref 70–99)
Potassium: 3.6 mmol/L (ref 3.5–5.1)
Sodium: 146 mmol/L — ABNORMAL HIGH (ref 135–145)

## 2019-04-25 LAB — CK: Total CK: 2295 U/L — ABNORMAL HIGH (ref 49–397)

## 2019-04-25 LAB — D-DIMER, QUANTITATIVE: D-Dimer, Quant: 4.06 ug/mL-FEU — ABNORMAL HIGH (ref 0.00–0.50)

## 2019-04-25 LAB — GLUCOSE, CAPILLARY: Glucose-Capillary: 128 mg/dL — ABNORMAL HIGH (ref 70–99)

## 2019-04-25 LAB — TSH: TSH: 1.308 u[IU]/mL (ref 0.350–4.500)

## 2019-04-25 MED ORDER — SODIUM CHLORIDE 0.9 % IV SOLN: INTRAVENOUS | Status: DC

## 2019-04-25 MED ORDER — DILTIAZEM HCL 30 MG PO TABS
30.0000 mg | ORAL_TABLET | Freq: Four times a day (QID) | ORAL | Status: DC
  Administered 2019-04-25 – 2019-04-26 (×4): 30 mg via ORAL
  Filled 2019-04-25 (×4): qty 1

## 2019-04-25 MED ORDER — SODIUM CHLORIDE 0.45 % IV SOLN: INTRAVENOUS | Status: DC

## 2019-04-25 MED ORDER — DILTIAZEM HCL 25 MG/5ML IV SOLN
10.0000 mg | Freq: Once | INTRAVENOUS | Status: AC
  Administered 2019-04-25: 01:00:00 10 mg via INTRAVENOUS
  Filled 2019-04-25: qty 5

## 2019-04-25 MED ORDER — IOHEXOL 350 MG/ML SOLN
100.0000 mL | Freq: Once | INTRAVENOUS | Status: AC | PRN
  Administered 2019-04-25: 12:00:00 100 mL via INTRAVENOUS

## 2019-04-25 NOTE — Social Work (Signed)
CSW received a call back on phone from pt sister Tamela Oddi. Pt sister with difficulty hearing this Probation officer, when re-attempted phone call her phone was busy, will continue to attempt as able.   Westley Hummer, MSW, [place] Work

## 2019-04-25 NOTE — Care Management (Addendum)
Called Vanessa Barbara, patient's sister at [phone], and left a HIPPA compliant message with her regarding possible placement in a 24-hour supervision SNF at the choice of the sister.  Message to return call to [place].  Mia Creek, RNCM Transitions of Care Department
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
## 2019-04-25 NOTE — Consult Note (Addendum)
Cardiology Consultation:   Patient ID: [name] MRN: [id number]; DOB: 1932-12-16  Admit date: 04/24/2019 Date of Consult: 04/25/2019  Primary Care Provider: Leonard Downing, MD Primary Cardiologist: New to Dr. Marlou Porch (previous patient of Dr. Mare Ferrari -last seen 2016)   Patient Profile:   [name] is a 86 y.o. male with a hx of bilateral pulmonary embolism following right hip replacement in February 2012 (treated with Coumadin for 6 months), hypertension, glaucoma, pulmonary nodules, vertigo, and DVT who is being seen today for the evaluation of atrial fibrillation at the request of Dr. Grandville Silos.  Previously followed by Dr. Mare Ferrari for remote history of pulmonary embolism and dyspnea on exertion which felt secondary to lack of activity.  Last seen November 2016.  History of Present Illness:   [name] admitted after being found down for unknown period of time.  He lives by himself.  Neighbors did not saw him for 4 days however his car was in the driveway and not have been moved.  Welfare check was called yesterday and he was found on the floor. Complaining of a right side pain. CT of  Head/spine concerning for suspected nondisplaced fracture of right maxillary sinus.  CK total 2166>>2295.  High-sensitivity troponin 37>>36. lactic acid 2.5>> 2.  Serum creatinine normal.  Electrolyte normal.  Mildly elevated LFTs.  Blood culture pending.  No active issue on chest x-ray.  TSH normal.  D-dimer elevated.  CT Angie of chest without evidence of pulmonary embolism however noted to have coronary artery calcification.  He was admitted and started on fluids.   Around midnight patient had episode of atrial fibrillation for approximately 2 hours.   He was given IV Cardizem 10 mg x 1 now on diltiazem p.o. 30 mg every 6 hours.  Currently maintaining sinus rhythm at rate of 100.  Echo with preserved LV function. MR of brain with small remote lacunar infarcts involving the right caudate,  right thalamus, and subcortical left parietal lobe.  Obtained from review of chart and patient.  He answers questions appropriately however still not completely alert and oriented.  Denies chest pain, shortness of breath, palpitation, orthopnea, PND, lower extremity edema or melena.  He is not able to recall any event or when he was normal last time.  History of dizziness and gait instability.  Heart Pathway Score:     Past Medical History:  Diagnosis Date  . Back pain   . DVT (deep venous thrombosis) ([place])   . Gait abnormality 05/23/2018  . Glaucoma   . Hypertension   . Insomnia   . Pulmonary embolism, bilateral [facility]) May 2012   on coumadin  . Skin cancer    forehead  . Skin cancer   . Vertigo     Past Surgical History:  Procedure Laterality Date  . BACK SURGERY     x3  . EXCISION MASS NECK Right 01/19/2018   Procedure: Wide local excision of right neck skin cancer (5 cm);  Surgeon: Melida Quitter, MD;  Location: [place];  Service: ENT;  Laterality: Right;  . RADICAL NECK DISSECTION Right 01/19/2018    Right modified radical neck dissection; Cervicopectoral flap (Right Neck)  . RADICAL NECK DISSECTION Right 01/19/2018   Procedure: Right modified radical neck dissection; Cervicopectoral flap;  Surgeon: Melida Quitter, MD;  Location: [place];  Service: ENT;  Laterality: Right;  . SHOULDER SURGERY     x3  Dr. Karrie Doffing al.  . TOTAL HIP ARTHROPLASTY  2012   Dr.  Olin right hip  . TRANSTHORACIC ECHOCARDIOGRAM  07/26/2010   Left ventricle: The cavity size was normal. Wall thickness was increased in a pattern of moderate LVH. Systolic function was normal. The estimated ejection fraction was in the range of 55% to 60%     Inpatient Medications: Scheduled Meds: . diltiazem  30 mg Oral Q6H  . sodium chloride flush  3 mL Intravenous Q12H  . vitamin B-12  1,000 mcg Oral Daily   Continuous Infusions: . sodium chloride 125 mL/hr at 04/25/19 0909   PRN Meds: acetaminophen **OR**  acetaminophen  Allergies:    Allergies  Allergen Reactions  . Codeine Nausea And Vomiting  . Morphine And Related Nausea And Vomiting    Social History:   Social History   Socioeconomic History  . Marital status: Divorced    Spouse name: Not on file  . Number of children: 1  . Years of education: Not on file  . Highest education level: Some college, no degree  Occupational History  . Not on file  Tobacco Use  . Smoking status: Former Smoker    Packs/day: 1.00    Years: 25.00    Pack years: 25.00    Types: Cigarettes    Quit date: 03/02/1981    Years since quitting: 38.1  . Smokeless tobacco: Former Systems developer    Types: [place] date: 03/02/2008  Substance and Sexual Activity  . Alcohol use: Yes    Comment: wine 3-4 times a week  . Drug use: No  . Sexual activity: Not on file  Other Topics Concern  . Not on file  Social History Narrative   Right handed    2 cups of caffeine per day    Lives at home alone    Social Determinants of Health   Financial Resource Strain:   . Difficulty of Paying Living Expenses: Not on file  Food Insecurity:   . Worried About Charity fundraiser in the Last Year: Not on file  . Ran Out of Food in the Last Year: Not on file  Transportation Needs:   . Lack of Transportation (Medical): Not on file  . Lack of Transportation (Non-Medical): Not on file  Physical Activity:   . Days of Exercise per Week: Not on file  . Minutes of Exercise per Session: Not on file  Stress:   . Feeling of Stress : Not on file  Social Connections:   . Frequency of Communication with Friends and Family: Not on file  . Frequency of Social Gatherings with Friends and Family: Not on file  . Attends Religious Services: Not on file  . Active Member of Clubs or Organizations: Not on file  . Attends Archivist Meetings: Not on file  . Marital Status: Not on file  Intimate Partner Violence:   . Fear of Current or Ex-Partner: Not on file  . Emotionally  Abused: Not on file  . Physically Abused: Not on file  . Sexually Abused: Not on file    Family History:   Family History  Problem Relation Age of Onset  . Stroke Father   . Alzheimer's disease Mother   . Lung cancer Brother      ROS:  Please see the history of present illness.  All other ROS reviewed and negative.     Physical Exam/Data:   Vitals:   04/25/19 0011 04/25/19 0103 04/25/19 0447 04/25/19 0754  BP: 125/84 126/63 121/71 140/86  Pulse: 73 96 (!) 101 (!)  108  Resp: 18 20 18 17   Temp:  97.7 F (36.5 C) 98.9 F (37.2 C) 99.8 F (37.7 C)  TempSrc:   Oral Oral  SpO2: 97% 98% 95% 93%  Height:        Intake/Output Summary (Last 24 hours) at 04/25/2019 1340 Last data filed at 04/25/2019 0300 Gross per 24 hour  Intake 1388.1 ml  Output 600 ml  Net 788.1 ml   Last 3 Weights 04/06/2019 09/26/2018 05/23/2018  Weight (lbs) 220 lb 226 lb 223 lb 5 oz  Weight (kg) 99.791 kg 102.513 kg 101.294 kg     Body mass index is 31.57 kg/m.  General:  Well nourished, well developed, in no acute distress HEENT: Erythema and swelling on right side of face Lymph: no adenopathy Neck: no JVD Endocrine:  No thryomegaly Vascular: No carotid bruits; FA pulses 2+ bilaterally without bruits  Cardiac:  normal S1, S2; RRR; no murmur Lungs:  clear to auscultation bilaterally, no wheezing, rhonchi or rales  Abd: soft, nontender, no hepatomegaly  Ext: Right hand with bruising and swelling Musculoskeletal:  No deformities, BUE and BLE strength normal and equal Skin: warm and dry  Neuro:   no focal abnormalities noted, alert and oriented only to name and date of birth Psych: Answers questions appropriately   EKG:  The EKG was personally reviewed and demonstrates: Atrial fibrillation at rate of 153 bpm, right bundle branch block Telemetry:  Telemetry was personally reviewed and demonstrates: Sinus tachycardia at rate of 100s, brief A. fib episode overnight  Relevant CV Studies:  Echo  04/25/19 1. Technically difficult echo with poor image quality.  2. Left ventricular ejection fraction, by estimation, is 50 to 55%. The  left ventricle has low normal function. The left ventricle has no regional  wall motion abnormalities. Left ventricular diastolic parameters are  consistent with Grade I diastolic  dysfunction (impaired relaxation).  3. Right ventricular systolic function is normal. The right ventricular  size is normal. There is normal pulmonary artery systolic pressure.  4. The mitral valve is normal in structure and function. No evidence of  mitral valve regurgitation. No evidence of mitral stenosis.  5. The aortic valve is normal in structure and function. Aortic valve  regurgitation is not visualized. No aortic stenosis is present.   Laboratory Data:  High Sensitivity Troponin:   Recent Labs  Lab 04/25/19 0923 04/25/19 1110  TROPONINIHS 37* 36*     Chemistry Recent Labs  Lab 04/24/19 1550 04/24/19 1721 04/25/19 0324  NA 144 144 146*  K 4.1 4.2 3.6  CL 109 111 111  CO2 22  --  23  GLUCOSE 162* 150* 143*  BUN 33* 38* 29*  CREATININE 1.04 0.80 0.86  CALCIUM 8.6*  --  8.1*  GFRNONAA >60  --  >60  GFRAA >60  --  >60  ANIONGAP 13  --  12    Recent Labs  Lab 04/24/19 1550  PROT 7.0  ALBUMIN 3.6  AST 93*  ALT 62*  ALKPHOS 71  BILITOT 1.6*   Hematology Recent Labs  Lab 04/24/19 1550 04/24/19 1721 04/25/19 0324  WBC 12.8*  --  9.9  RBC 5.07  --  4.53  HGB 17.0 17.0 15.1  HCT 51.7 50.0 45.9  MCV 102.0*  --  101.3*  MCH 33.5  --  33.3  MCHC 32.9  --  32.9  RDW 13.2  --  13.2  PLT 224  --  185   DDimer  Recent Labs  Lab 04/25/19 0923  DDIMER 4.06*    Radiology/Studies:  DG Chest 1 View  Result Date: 04/24/2019 CLINICAL DATA:  86 year old male found on the floor. EXAM: CHEST  1 VIEW COMPARISON:  Chest radiograph dated 07/31/2010. FINDINGS: There is mild eventration of the right hemidiaphragm. No focal consolidation, pleural  effusion, or pneumothorax. Minimal left lung base atelectasis. The cardiac silhouette is within normal limits. Atherosclerotic calcification of the aorta. No acute osseous pathology. IMPRESSION: No active disease. Electronically Signed   By: Anner Crete M.D.   On: 04/24/2019 17:26   DG Pelvis 1-2 Views  Result Date: 04/24/2019 CLINICAL DATA:  Unwitnessed fall. Found on floor. EXAM: PELVIS - 1-2 VIEW COMPARISON:  None. FINDINGS: Right hip arthroplasty in expected alignment. No periprosthetic lucency or fracture. Bony pelvis is intact. No acute pelvic fracture. Postsurgical change in the lower lumbar spine is partially included. Mild left hip osteoarthritis. IMPRESSION: No pelvic fracture. Right hip arthroplasty in expected alignment without complication. Electronically Signed   By: Keith Rake M.D.   On: 04/24/2019 17:28   DG Elbow Complete Right  Result Date: 04/24/2019 CLINICAL DATA:  Unwitnessed fall. Found on the floor. EXAM: RIGHT ELBOW - COMPLETE 3+ VIEW COMPARISON:  None. FINDINGS: There is no evidence of fracture, dislocation, or joint effusion. Tiny olecranon spur. There is no evidence of arthropathy or other focal bone abnormality. Mild soft tissue edema. IMPRESSION: Mild soft tissue edema without osseous abnormality. Electronically Signed   By: Keith Rake M.D.   On: 04/24/2019 17:27   DG Wrist Complete Right  Result Date: 04/24/2019 CLINICAL DATA:  Unwitnessed fall. Found on floor. EXAM: RIGHT WRIST - COMPLETE 3+ VIEW COMPARISON:  None. FINDINGS: There is no evidence of fracture or dislocation. There is no evidence of arthropathy or other focal bone abnormality. Generalized soft tissue edema. IMPRESSION: Soft tissue edema without acute osseous abnormality. Electronically Signed   By: Keith Rake M.D.   On: 04/24/2019 17:29   CT Head Wo Contrast  Result Date: 04/24/2019 CLINICAL DATA:  Facial trauma, altered mental status, uncertain cause EXAM: CT HEAD WITHOUT CONTRAST  CT MAXILLOFACIAL WITHOUT CONTRAST CT CERVICAL SPINE WITHOUT CONTRAST TECHNIQUE: Multidetector CT imaging of the head, cervical spine, and maxillofacial structures were performed using the standard protocol without intravenous contrast. Multiplanar CT image reconstructions of the cervical spine and maxillofacial structures were also generated. COMPARISON:  MR brain, 06/01/2018 FINDINGS: CT HEAD FINDINGS Brain: No evidence of acute infarction, hemorrhage, hydrocephalus, extra-axial collection or mass lesion/mass effect. Extensive periventricular and deep white matter hypodensity, global volume loss, and enlargement of the lateral ventricles, likely ex vacuo. Vascular: No hyperdense vessel or unexpected calcification. CT FACIAL BONES FINDINGS Skull: Normal. Negative for fracture or focal lesion. Facial bones: Suspect nondisplaced fracture of the lateral and posterior walls of the right maxillary sinus (series 10, image 46). Sinuses/Orbits: No acute finding. Mucosal thickening in the maxillary sinuses and ethmoid air cells. No air-fluid levels. Other: Soft tissue contusion overlying the right forehead, right scalp, and right cheek. CT CERVICAL SPINE FINDINGS Alignment: Normal. Skull base and vertebrae: No acute fracture. No primary bone lesion or focal pathologic process. Soft tissues and spinal canal: No prevertebral fluid or swelling. No visible canal hematoma. Disc levels: Mild to moderate multilevel disc space height loss and osteophytosis. Upper chest: Negative. Other: Postoperative findings of right cervical dissection IMPRESSION: 1.  No acute intracranial pathology. 2. Extensive small-vessel white matter disease and global severe volume loss. 3. Enlarged lateral ventricles, generally unchanged in appearance and  configuration compared to prior examination, likely ex vacuo. 4. No displaced fractures of the facial bones. Suspect nondisplaced fracture of the lateral and posterior walls of the right maxillary sinus  (series 10, image 46). 5. Soft tissue overlying the right forehead, right scalp, and right cheek. 6.  No fracture or static subluxation of the cervical spine. 7.  Postoperative findings of right cervical dissection. Electronically Signed   By: Eddie Candle M.D.   On: 04/24/2019 18:07   CT ANGIO CHEST PE W OR WO CONTRAST  Result Date: 04/25/2019 CLINICAL DATA:  Syncope. EXAM: CT ANGIOGRAPHY CHEST WITH CONTRAST TECHNIQUE: Multidetector CT imaging of the chest was performed using the standard protocol during bolus administration of intravenous contrast. Multiplanar CT image reconstructions and MIPs were obtained to evaluate the vascular anatomy. CONTRAST:  169mL OMNIPAQUE IOHEXOL 350 MG/ML SOLN COMPARISON:  Jul 25, 2010. FINDINGS: Cardiovascular: Satisfactory opacification of the pulmonary arteries to the segmental level. No evidence of pulmonary embolism. Normal heart size. No pericardial effusion. Atherosclerosis of thoracic aorta is noted without aneurysm formation. Coronary artery calcifications are noted. Mediastinum/Nodes: No enlarged mediastinal, hilar, or axillary lymph nodes. Thyroid gland, trachea, and esophagus demonstrate no significant findings. Lungs/Pleura: No pneumothorax pleural effusion is noted. Minimal subsegmental atelectasis is noted in the right lower lobe. Upper Abdomen: No acute abnormality. Musculoskeletal: No chest wall abnormality. No acute or significant osseous findings. Review of the MIP images confirms the above findings. IMPRESSION: 1. No definite evidence of pulmonary embolus. 2. Coronary artery calcifications are noted suggesting coronary artery disease. 3. Minimal subsegmental atelectasis is noted in the right lower lobe. Aortic Atherosclerosis (ICD10-I70.0). Electronically Signed   By: Marijo Conception M.D.   On: 04/25/2019 12:40   CT Cervical Spine Wo Contrast  Result Date: 04/24/2019 CLINICAL DATA:  Facial trauma, altered mental status, uncertain cause EXAM: CT HEAD WITHOUT  CONTRAST CT MAXILLOFACIAL WITHOUT CONTRAST CT CERVICAL SPINE WITHOUT CONTRAST TECHNIQUE: Multidetector CT imaging of the head, cervical spine, and maxillofacial structures were performed using the standard protocol without intravenous contrast. Multiplanar CT image reconstructions of the cervical spine and maxillofacial structures were also generated. COMPARISON:  MR brain, 06/01/2018 FINDINGS: CT HEAD FINDINGS Brain: No evidence of acute infarction, hemorrhage, hydrocephalus, extra-axial collection or mass lesion/mass effect. Extensive periventricular and deep white matter hypodensity, global volume loss, and enlargement of the lateral ventricles, likely ex vacuo. Vascular: No hyperdense vessel or unexpected calcification. CT FACIAL BONES FINDINGS Skull: Normal. Negative for fracture or focal lesion. Facial bones: Suspect nondisplaced fracture of the lateral and posterior walls of the right maxillary sinus (series 10, image 46). Sinuses/Orbits: No acute finding. Mucosal thickening in the maxillary sinuses and ethmoid air cells. No air-fluid levels. Other: Soft tissue contusion overlying the right forehead, right scalp, and right cheek. CT CERVICAL SPINE FINDINGS Alignment: Normal. Skull base and vertebrae: No acute fracture. No primary bone lesion or focal pathologic process. Soft tissues and spinal canal: No prevertebral fluid or swelling. No visible canal hematoma. Disc levels: Mild to moderate multilevel disc space height loss and osteophytosis. Upper chest: Negative. Other: Postoperative findings of right cervical dissection IMPRESSION: 1.  No acute intracranial pathology. 2. Extensive small-vessel white matter disease and global severe volume loss. 3. Enlarged lateral ventricles, generally unchanged in appearance and configuration compared to prior examination, likely ex vacuo. 4. No displaced fractures of the facial bones. Suspect nondisplaced fracture of the lateral and posterior walls of the right maxillary  sinus (series 10, image 46). 5. Soft tissue overlying the right  forehead, right scalp, and right cheek. 6.  No fracture or static subluxation of the cervical spine. 7.  Postoperative findings of right cervical dissection. Electronically Signed   By: Eddie Candle M.D.   On: 04/24/2019 18:07   MR BRAIN WO CONTRAST  Result Date: 04/24/2019 CLINICAL DATA:  Initial evaluation for acute encephalopathy, found down. EXAM: MRI HEAD WITHOUT CONTRAST TECHNIQUE: Multiplanar, multiecho pulse sequences of the brain and surrounding structures were obtained without intravenous contrast. COMPARISON:  Comparison made with prior CT from earlier same day. FINDINGS: Brain: Diffuse prominence of the CSF containing spaces compatible generalized age-related cerebral atrophy. Minimal T2/FLAIR hyperintensity within the periventricular white matter, nonspecific, but most likely related chronic microvascular ischemic disease, felt to be within normal limits for age. Small remote lacunar infarcts present at the right caudate, right thalamus, and posterior left subcortical white matter. Associated small chronic microhemorrhage noted about the left-sided chronic lacunar infarct (series 14, image 35). No abnormal foci of restricted diffusion to suggest acute or subacute ischemia. Gray-white matter differentiation maintained. No encephalomalacia to suggest chronic cortical infarction. No other evidence for acute or chronic intracranial hemorrhage. No mass lesion, midline shift or mass effect. Diffuse ventricular prominence, not significantly changed as compared to previous, most likely related to global parenchymal volume loss. No evidence for transependymal flow of CSF. No extra-axial fluid collection. Pituitary gland suprasellar region normal. Midline structures intact. Vascular: Major intracranial vascular flow voids are maintained. Skull and upper cervical spine: Craniocervical junction within normal limits. Upper cervical spine  unremarkable. Bone marrow signal intensity within normal limits. Soft tissue contusion/edema seen involving the right frontotemporal scalp. Sinuses/Orbits: Patient status post bilateral ocular lens replacement. Scattered mucosal thickening noted throughout the paranasal sinuses. Superimposed small retention cyst noted within the left maxillary sinus. No significant mastoid effusion. Inner ear structures grossly normal. Other: None. IMPRESSION: 1. No acute intracranial abnormality. 2. Soft tissue contusion involving the right frontotemporal scalp. 3. Age-related cerebral atrophy with small remote lacunar infarcts involving the right caudate, right thalamus, and subcortical left parietal lobe. 4. Diffuse ventricular prominence, similar to previous, and most likely related to global parenchymal volume loss. Electronically Signed   By: Jeannine Boga M.D.   On: 04/24/2019 22:52   ECHOCARDIOGRAM COMPLETE  Result Date: 04/25/2019    ECHOCARDIOGRAM REPORT   Patient Name:   [name] Date of Exam: 04/25/2019 Medical Rec #:  [id number]    Height:       70.0 in Accession #:    [id number]   Weight:       220.0 lb Date of Birth:  [birth year]   BSA:          2.174 m Patient Age:    59 years     BP:           140/86 mmHg Patient Gender: M            HR:           107 bpm. Exam Location:  Inpatient Procedure: 2D Echo, Cardiac Doppler and Color Doppler Indications:    Syncope 780.2  History:        Patient has no prior history of Echocardiogram examinations.                 Signs/Symptoms:Dyspnea; Risk Factors:Hypertension and Former                 Smoker. Pulmonary embolism.  Sonographer:    Vickie Epley RDCS Referring Phys: [id number] [place]  Sonographer Comments: Technically difficult study due to poor echo windows. IMPRESSIONS  1. Technically difficult echo with poor image quality.  2. Left ventricular ejection fraction, by estimation, is 50 to 55%. The left ventricle has low normal function. The left ventricle  has no regional wall motion abnormalities. Left ventricular diastolic parameters are consistent with Grade I diastolic dysfunction (impaired relaxation).  3. Right ventricular systolic function is normal. The right ventricular size is normal. There is normal pulmonary artery systolic pressure.  4. The mitral valve is normal in structure and function. No evidence of mitral valve regurgitation. No evidence of mitral stenosis.  5. The aortic valve is normal in structure and function. Aortic valve regurgitation is not visualized. No aortic stenosis is present. FINDINGS  Left Ventricle: Left ventricular ejection fraction, by estimation, is 50 to 55%. The left ventricle has low normal function. The left ventricle has no regional wall motion abnormalities. The left ventricular internal cavity size was normal in size. There is no left ventricular hypertrophy. Left ventricular diastolic parameters are consistent with Grade I diastolic dysfunction (impaired relaxation). Right Ventricle: The right ventricular size is normal. No increase in right ventricular wall thickness. Right ventricular systolic function is normal. There is normal pulmonary artery systolic pressure. The tricuspid regurgitant velocity is 2.33 m/s, and  with an assumed right atrial pressure of 8 mmHg, the estimated right ventricular systolic pressure is [id number] mmHg. Left Atrium: Left atrial size was normal in size. Right Atrium: Right atrial size was normal in size. Pericardium: Trivial pericardial effusion is present. Mitral Valve: The mitral valve is normal in structure and function. No evidence of mitral valve regurgitation. No evidence of mitral valve stenosis. Tricuspid Valve: The tricuspid valve is grossly normal. Tricuspid valve regurgitation is trivial. Aortic Valve: The aortic valve is normal in structure and function. Aortic valve regurgitation is not visualized. No aortic stenosis is present. Pulmonic Valve: The pulmonic valve was not well  visualized. Pulmonic valve regurgitation is not visualized. Aorta: The aortic root and ascending aorta are structurally normal, with no evidence of dilitation. IAS/Shunts: The atrial septum is grossly normal. Additional Comments: Technically difficult echo with poor image quality.  LEFT VENTRICLE PLAX 2D LVIDd:         4.60 cm LVIDs:         3.50 cm LV PW:         0.90 cm LV IVS:        0.90 cm LVOT diam:     1.90 cm LV SV:         40 LV SV Index:   18 LVOT Area:     2.84 cm  LV Volumes (MOD) LV vol d, MOD A4C: 72.5 ml LV vol s, MOD A4C: 35.2 ml LV SV MOD A4C:     72.5 ml RIGHT VENTRICLE TAPSE (M-mode): 1.8 cm LEFT ATRIUM           Index LA diam:      2.90 cm 1.33 cm/m LA Vol (A4C): 25.7 ml 11.82 ml/m  AORTIC VALVE LVOT Vmax:   85.50 cm/s LVOT Vmean:  57.400 cm/s LVOT VTI:    0.141 m  AORTA Ao Root diam: 3.80 cm Ao Asc diam:  3.40 cm MITRAL VALVE               TRICUSPID VALVE MV Area (PHT): 3.42 cm    TR Peak grad:   21.7 mmHg MV Decel Time: 222 msec    TR Vmax:  233.00 cm/s MV E velocity: 50.60 cm/s MV A velocity: 70.30 cm/s  SHUNTS MV E/A ratio:  0.72        Systemic VTI:  0.14 m                            Systemic Diam: 1.90 cm Mertie Moores MD Electronically signed by Mertie Moores MD Signature Date/Time: 04/25/2019/10:38:13 AM    Final    CT Maxillofacial WO CM  Result Date: 04/24/2019 CLINICAL DATA:  Facial trauma, altered mental status, uncertain cause EXAM: CT HEAD WITHOUT CONTRAST CT MAXILLOFACIAL WITHOUT CONTRAST CT CERVICAL SPINE WITHOUT CONTRAST TECHNIQUE: Multidetector CT imaging of the head, cervical spine, and maxillofacial structures were performed using the standard protocol without intravenous contrast. Multiplanar CT image reconstructions of the cervical spine and maxillofacial structures were also generated. COMPARISON:  MR brain, 06/01/2018 FINDINGS: CT HEAD FINDINGS Brain: No evidence of acute infarction, hemorrhage, hydrocephalus, extra-axial collection or mass lesion/mass effect.  Extensive periventricular and deep white matter hypodensity, global volume loss, and enlargement of the lateral ventricles, likely ex vacuo. Vascular: No hyperdense vessel or unexpected calcification. CT FACIAL BONES FINDINGS Skull: Normal. Negative for fracture or focal lesion. Facial bones: Suspect nondisplaced fracture of the lateral and posterior walls of the right maxillary sinus (series 10, image 46). Sinuses/Orbits: No acute finding. Mucosal thickening in the maxillary sinuses and ethmoid air cells. No air-fluid levels. Other: Soft tissue contusion overlying the right forehead, right scalp, and right cheek. CT CERVICAL SPINE FINDINGS Alignment: Normal. Skull base and vertebrae: No acute fracture. No primary bone lesion or focal pathologic process. Soft tissues and spinal canal: No prevertebral fluid or swelling. No visible canal hematoma. Disc levels: Mild to moderate multilevel disc space height loss and osteophytosis. Upper chest: Negative. Other: Postoperative findings of right cervical dissection IMPRESSION: 1.  No acute intracranial pathology. 2. Extensive small-vessel white matter disease and global severe volume loss. 3. Enlarged lateral ventricles, generally unchanged in appearance and configuration compared to prior examination, likely ex vacuo. 4. No displaced fractures of the facial bones. Suspect nondisplaced fracture of the lateral and posterior walls of the right maxillary sinus (series 10, image 46). 5. Soft tissue overlying the right forehead, right scalp, and right cheek. 6.  No fracture or static subluxation of the cervical spine. 7.  Postoperative findings of right cervical dissection. Electronically Signed   By: Eddie Candle M.D.   On: 04/24/2019 18:07    Assessment and Plan:   1.  Atrial fibrillation with rapid ventricular rate -New onset.  Spontaneously converted after IV Cardizem 10 mg overnight.  Currently sinus tachycardic at rate of 100s on p.o. Cardizem 30 mg every 6 hours.   TSH normal.  Echocardiogram with low normal LV function at 50 to 55%, no wall motion abnormality and grade 1 diastolic dysfunction -In setting of dehydration/rhabdomyolysis for being down for extended period of time.  - MR of brain with small remote lacunar infarcts involving the right caudate, right thalamus, and subcortical left parietal lobe. -Patient denies palpitation however unreliable history -It is possible that he could have underlying asymptomatic atrial fibrillation - CHADSVASCs score of 5 for age, HTN, thromboembolism) however he is high risk for falls. Lacunar stroke usually not in embolic in nature. Risk of bleeding outweighs benefits. -Continue Cardizem at current dose consolidate to long-acting tomorrow.  May uptitrate as needed.  2.  Elevated troponin -High-sensitivity troponin 37>> 36 -EKG without ischemic changes -He has evidence  of coronary artery calcification by CT Angio of chest - Consider restarting home ASA 81mg   and adding statin   3.  Fall/syncope -Unknown etiology.  Patient has history of dizziness and gait instability and followed by neurology. -Per primary team  4.  Rhabdomyolysis/transaminitis -On fluids per primary team  5.  Right maxillary fracture  6.  Hypertension -Blood pressure improving, intermittently hypertensive -Home losartan on hold  For questions or updates, please contact [place] Please consult [email or web address] for contact info under     Signed, Leanor Kail, PA  04/25/2019 1:40 PM   Personally seen and examined. Agree with above.   86 year old who was found down at his home with recurrent and severe falls followed by Dr. Jannifer Franklin of neurology here for resuscitation and recuperation following his fall.  Echocardiogram overall reassuring with normal pump function.  Overnight, had brief episode of atrial fibrillation with rapid ventricular response, diltiazem utilized subsequent conversion to normal sinus rhythm.  He is  currently laying in bed, mildly confused.  Bruises noted.  Heart regular rate and rhythm lungs are clear moves all extremities.  Labs fairly unremarkable.  Assessment and plan:  Paroxysmal atrial fibrillation -Now in sinus rhythm.  Continue with low-dose diltiazem.  Likely can consolidate to once a day dose tomorrow with long-acting diltiazem. -His chads vasc score is high enough to warrant anticoagulation, however his risk of future bleeding secondary to his falls is too high and therefore he is not a good candidate for anticoagulation.  Continue with aspirin.  This does place him at future risk for embolic stroke.  MRI shows lacunar infarcts which are nonembolic phenomenon.  His fracture of right maxillary sinus is indicative of the severity of his falls.  Telemetry personally viewed shows no pauses.  No bradycardia.  No postconversion pause.  Atrial fibrillation did not cause his fall.  Mildly elevated troponin of 37 flat indicative of his underlying metabolic derangement.  Please let us know if we can be of further assistance.  We will sign off.  Candee Furbish, MD

## 2019-04-25 NOTE — Evaluation (Addendum)
Physical Therapy Evaluation Patient Details Name: [name] MRN: [id number] DOB: Sep 03, 1932 Today's Date: 04/25/2019   History of Present Illness  86 yo admitted after found down at home with maxillary fx. PMHx: gait disorder, PE, HTN, glaucoma, back sx, Rt THA, vertigo, skin CA with radical neck dissection  Clinical Impression  Pt supine on arrival unaware of what day he fell or why and when informed of potential for 4 days since neighbors were unable to reach him he stated "well my neighbors lie and I might have ignored them". Pt with impaired balance, transfers, strength, cognition and mobility who will benefit from acute therapy to maximize mobility, safety, strength and independence to decrease burden of care.     Follow Up Recommendations SNF;Supervision/Assistance - 24 hour    Equipment Recommendations  Wheelchair (measurements PT);3in1 (PT)    Recommendations for Other Services OT consult     Precautions / Restrictions Precautions Precautions: Fall      Mobility  Bed Mobility Overal bed mobility: Needs Assistance Bed Mobility: Supine to Sit;Sit to Supine     Supine to sit: HOB elevated;Max assist Sit to supine: Mod assist   General bed mobility comments: max assist to move legs toward EOB and elevate trunk with HOB 20 degrees, increased time and cues for sequence  Transfers Overall transfer level: Needs assistance   Transfers: Sit to/from Stand Sit to Stand: Max assist         General transfer comment: attempted to rise from elevated surface with pt able to initiate anterior translation and rise but could not life sacrum from surface with +1 assist and was returned to bed  Ambulation/Gait                Stairs            Wheelchair Mobility    Modified Rankin (Stroke Patients Only)       Balance Overall balance assessment: Needs assistance Sitting-balance support: Feet supported;Bilateral upper extremity supported Sitting  balance-Leahy Scale: Poor Sitting balance - Comments: min-mod assist for sitting balance with posterior right lean with max cues to correct                                     Pertinent Vitals/Pain Pain Assessment: 0-10 Pain Score: 5  Pain Location: RUE Pain Descriptors / Indicators: Aching Pain Intervention(s): Limited activity within patient's tolerance;Monitored during session;Repositioned    Home Living Family/patient expects to be discharged to:: Private residence Living Arrangements: Alone   Type of Home: House Home Access: Stairs to enter   Technical brewer of Steps: 1 Home Layout: One level Home Equipment: Cane - single point      Prior Function Level of Independence: Independent               Hand Dominance        Extremity/Trunk Assessment   Upper Extremity Assessment Upper Extremity Assessment: Generalized weakness    Lower Extremity Assessment Lower Extremity Assessment: RLE deficits/detail;LLE deficits/detail RLE Deficits / Details: grossly 2+/5 LLE Deficits / Details: grossly 2+/5    Cervical / Trunk Assessment Cervical / Trunk Assessment: Kyphotic  Communication   Communication: No difficulties  Cognition Arousal/Alertness: Awake/alert Behavior During Therapy: WFL for tasks assessed/performed Overall Cognitive Status: Impaired/Different from baseline Area of Impairment: Orientation;Attention;Memory;Following commands;Safety/judgement;Problem solving                 Orientation Level: Disoriented  to;Time;Situation Current Attention Level: Sustained Memory: Decreased short-term memory;Decreased recall of precautions Following Commands: Follows one step commands with increased time;Follows one step commands inconsistently Safety/Judgement: Decreased awareness of deficits;Decreased awareness of safety   Problem Solving: Slow processing;Decreased initiation;Difficulty sequencing;Requires verbal cues;Requires tactile  cues General Comments: pt unaware of circumstances leading to admission, stating month as March and year as "01". pt slow to process and unable to recognize errors and deficits      General Comments      Exercises General Exercises - Lower Extremity Short Arc Quad: AROM;Both;10 reps;Seated Hip Flexion/Marching: AAROM;Both;Seated;10 reps   Assessment/Plan    PT Assessment Patient needs continued PT services  PT Problem List Decreased strength;Decreased mobility;Decreased safety awareness;Decreased range of motion;Decreased coordination;Decreased activity tolerance;Decreased balance;Decreased cognition;Decreased knowledge of use of DME;Decreased skin integrity       PT Treatment Interventions DME instruction;Therapeutic exercise;Gait training;Balance training;Functional mobility training;Therapeutic activities;Patient/family education;Cognitive remediation    PT Goals (Current goals can be found in the Care Plan section)  Acute Rehab PT Goals Patient Stated Goal: return home PT Goal Formulation: With patient Time For Goal Achievement: 05/09/19 Potential to Achieve Goals: Fair    Frequency Min 2X/week   Barriers to discharge Decreased caregiver support      Co-evaluation               AM-PAC PT "6 Clicks" Mobility  Outcome Measure Help needed turning from your back to your side while in a flat bed without using bedrails?: A Lot Help needed moving from lying on your back to sitting on the side of a flat bed without using bedrails?: A Lot Help needed moving to and from a bed to a chair (including a wheelchair)?: Total Help needed standing up from a chair using your arms (e.g., wheelchair or bedside chair)?: Total Help needed to walk in hospital room?: Total Help needed climbing 3-5 steps with a railing? : Total 6 Click Score: 8    End of Session   Activity Tolerance: Patient tolerated treatment well Patient left: in bed;with call bell/phone within reach;with bed  alarm set Nurse Communication: Mobility status;Need for lift equipment PT Visit Diagnosis: Difficulty in walking, not elsewhere classified (R26.2);Other abnormalities of gait and mobility (R26.89);Muscle weakness (generalized) (M62.81);History of falling (Z91.81)    Time: [id number] PT Time Calculation (min) (ACUTE ONLY): 21 min   Charges:   PT Evaluation $PT Eval Moderate Complexity: 1 Mod          [place], PT Acute Rehabilitation Services Pager: [phone] Office: [phone]   Sandy Salaam Gaby Harney 04/25/2019, 12:17 PM

## 2019-04-25 NOTE — Plan of Care (Signed)

## 2019-04-25 NOTE — Progress Notes (Signed)
Rapid Response Event Note  Overview:  Administration of Cardizem (bolus) in Afib with RVR  Initial Focused Assessment: Notified by charge RN Blanch Media regarding Cardizem IV bolus on a pt who is on telemetry.   Lurlean Leyden APP notified by staff. Cardizem ordered by [facility] for HR control of Afib 140s. Upon arrival, pt was not in distress and not SOB. 12 lead EKG confirms HR afib 140s, 125/84 (95), RR 18 and sats 97% on RA. Cardizem 10 mg IV ordered and administered. HR 95 SR after cardizem bolus.  Pt tolerated well.   0103-HR 96 SR, 126/63, RR 20 with sats 98% on RA  Interventions: -Cardizem 10 mg IV bolus  Plan of Care (if not transferred): -Recheck full set of VS in 15 mins -Continue to monitor HR for further Afib/tachycardia -Notify primary svc and/or RRRN for any further assistance  Event Summary: Call received 0040 Arrived 0046 Call ended [phone]  Madelynn Done

## 2019-04-25 NOTE — NC FL2 (Signed)
[place] MEDICAID FL2 LEVEL OF CARE SCREENING TOOL     IDENTIFICATION  Patient Name: [name] Birthdate: 1932-12-29 Sex: male Admission Date (Current Location): 04/24/2019  [facility] and [place] Number:  Herbalist and Address:  [facility], [place] [address], [place], [place] [address]      Provider Number: [id number]  Attending Physician Name and Address:  Eugenie Filler, MD  Relative Name and Phone Number:       Current Level of Care: Hospital Recommended Level of Care: [place] Prior Approval Number:    Date Approved/Denied:   PASRR Number: [id number]  Discharge Plan: SNF    Current Diagnoses: Patient Active Problem List   Diagnosis Date Noted  . Atrial fibrillation ([place]) 04/25/2019  . Abnormal MRI of head 04/25/2019  . Syncope 04/25/2019  . Hyperglycemia 04/25/2019  . Elevated LFTs 04/25/2019  . Hypernatremia 04/25/2019  . Fall 04/24/2019  . Essential hypertension 04/24/2019  . History of pulmonary embolism 04/24/2019  . Right maxillary fracture ([place]) 04/24/2019  . Elevated CK 04/24/2019  . Dizziness 09/26/2018  . Gait abnormality 05/23/2018  . Skin cancer of scalp or skin of neck 01/19/2018  . Abnormal chest x-ray 02/17/2011  . Osteoarthritis 11/18/2010  . Pulmonary embolism ([place]) 08/15/2010  . Chronic anticoagulation 08/15/2010  . Lung nodule 08/15/2010  . Cor pulmonale ([place]) 08/15/2010  . Dyspnea 07/25/2010  . Glaucoma   . Back pain   . Skin cancer   . Insomnia     Orientation RESPIRATION BLADDER Height & Weight     Self, Place  Normal Incontinent Weight:   Height:  5\' 10"  (177.8 cm)  BEHAVIORAL SYMPTOMS/MOOD NEUROLOGICAL BOWEL NUTRITION STATUS      Incontinent Diet(see discharge summary)  AMBULATORY STATUS COMMUNICATION OF NEEDS Skin   Extensive Assist Verbally Skin abrasions, Other (Comment)(abrasion on arm and knee, generalized echemosis, moist groin and abdoment with barrier  cream)                       Personal Care Assistance Level of Assistance  Bathing, Feeding, Dressing Bathing Assistance: Maximum assistance Feeding assistance: Independent Dressing Assistance: Maximum assistance     Functional Limitations Info  Sight, Hearing, Speech Sight Info: Impaired Hearing Info: Adequate Speech Info: Impaired(due to injury)    SPECIAL CARE FACTORS FREQUENCY  PT (By licensed PT), OT (By licensed OT)     PT Frequency: 5 times a week OT Frequency: 5 times a week            Contractures Contractures Info: Not present    Additional Factors Info  Code Status, Allergies Code Status Info: Full Allergies Info: codiene - N/V, morphine - N/V           Current Medications (04/25/2019):  This is the current hospital active medication list Current Facility-Administered Medications  Medication Dose Route Frequency Provider Last Rate Last Admin  . 0.45 % sodium chloride infusion   Intravenous Continuous Eugenie Filler, MD 125 mL/hr at 04/25/19 0909 New Bag at 04/25/19 0909  . acetaminophen (TYLENOL) tablet 650 mg  650 mg Oral Q6H PRN Lenore Cordia, MD       Or  . acetaminophen (TYLENOL) suppository 650 mg  650 mg Rectal Q6H PRN Zada Finders R, MD      . diltiazem (CARDIZEM) tablet 30 mg  30 mg Oral Q6H Eugenie Filler, MD   30 mg at 04/25/19 1354  . sodium  chloride flush (NS) 0.9 % injection 3 mL  3 mL Intravenous Q12H Lenore Cordia, MD   3 mL at 04/25/19 0933  . vitamin B-12 (CYANOCOBALAMIN) tablet 1,000 mcg  1,000 mcg Oral Daily Lenore Cordia, MD   1,000 mcg at 04/25/19 [id number]     Discharge Medications: Please see discharge summary for a list of discharge medications.  Relevant Imaging Results:  Relevant Lab Results:   Additional Information SS# [id number]  Curlene Labrum, RN

## 2019-04-25 NOTE — TOC Initial Note (Signed)
Transition of Care [facility]) - Initial/Assessment Note    Patient Details  Name: [name] MRN: [id number] Date of Birth: 1933-01-28  [facility] CM/SW Contact:    Alexander Mt, LCSW Phone Number: 04/25/2019, 2:29 PM  Clinical Narrative:                 CSW spoke with pt sister Doris via telephone. Introduced self, role, reason for call. Pt sister confirms pt from home alone, she is not sure if he is still seeing PCP Dr. Arelia Sneddon but confirms home address. She states that he intermittantly uses a cane and walker. CSW shared that pt is recommended for a SNF placement due to current care needs. Discussed that pt would benefit from regular therapy and supervision at a SNF since he lives alone. Pt sister at this time does not feel comfortable making a decision for pt. She states he has a son Synetta Shadow at [phone]. She would like this writer/TOC team to reach out to [facility] to discuss recommendations and go from there. Per sister Synetta Shadow lives in [place], this Probation officer is unsure how much assist he currently provides. Per sister pt is very independent and may not want to go to SNF.   TOC team will follow for SNF placement/HH preference.   Expected Discharge Plan: Skilled Nursing Facility Barriers to Discharge: Continued Medical Work up, Ship broker   Patient Goals and CMS Choice CMS Medicare.gov Compare Post Acute Care list provided to:: Patient Represenative (must comment)(pt son) Choice offered to / list presented to : Sibling, Adult Children, Patient  Expected Discharge Plan and Services Expected Discharge Plan: [place] In-house Referral: Clinical Social Work Discharge Planning Services: CM Consult Post Acute Care Choice: (TBD; SNF vs HH) Living arrangements for the past 2 months: Single Family Home  Prior Living Arrangements/Services Living arrangements for the past 2 months: Single Family Home Lives with:: Self Patient language and need for  interpreter reviewed:: Yes(no needs) Do you feel safe going back to the place where you live?: (pt only oriented to person and place)      Need for Family Participation in Patient Care: Yes (Comment)(assistance with daily cares as needed) Care giver support system in place?: Yes (comment)(sister) Current home services: DME Criminal Activity/Legal Involvement Pertinent to Current Situation/Hospitalization: No - Comment as needed  Activities of Daily Living Home Assistive Devices/Equipment: None ADL Screening (condition at time of admission) Does the patient have difficulty concentrating, remembering, or making decisions?: Yes Does the patient have difficulty walking or climbing stairs?: Yes Weakness of Legs: Both Weakness of Arms/Hands: Both  Permission Sought/Granted Permission sought to share information with : Family Supports Permission granted to share information with : No(pt with fluctuating orientation (person and place))  Share Information with NAME: Vanessa Barbara; Otilio Connors  Permission granted to share info w AGENCY: SNFs  Permission granted to share info w Relationship: sister; son  Permission granted to share info w Contact Information: [phone]; [phone]  Emotional Assessment Appearance:: Other (Comment Required(telephonic assessment with pt sister) Attitude/Demeanor/Rapport: (telephonic assessment with pt sister) Affect (typically observed): (telephonic assessment with pt sister) Orientation: : Oriented to Self, Oriented to Place, Fluctuating Orientation (Suspected and/or reported Sundowners) Alcohol / Substance Use: Not Applicable Psych Involvement: No (comment)(not applicable; followed outpatient neurology)  Admission diagnosis:  Pain [R52] Fall [W19.XXXA] Patient Active Problem List   Diagnosis Date Noted  . Atrial fibrillation ([place]) 04/25/2019  . Abnormal MRI of head 04/25/2019  . Syncope 04/25/2019  . Hyperglycemia  04/25/2019  . Elevated LFTs 04/25/2019   . Hypernatremia 04/25/2019  . Fall 04/24/2019  . Essential hypertension 04/24/2019  . History of pulmonary embolism 04/24/2019  . Right maxillary fracture ([place]) 04/24/2019  . Elevated CK 04/24/2019  . Dizziness 09/26/2018  . Gait abnormality 05/23/2018  . Skin cancer of scalp or skin of neck 01/19/2018  . Abnormal chest x-ray 02/17/2011  . Osteoarthritis 11/18/2010  . Pulmonary embolism ([place]) 08/15/2010  . Chronic anticoagulation 08/15/2010  . Lung nodule 08/15/2010  . Cor pulmonale ([place]) 08/15/2010  . Dyspnea 07/25/2010  . Glaucoma   . Back pain   . Skin cancer   . Insomnia    PCP:  Leonard Downing, MD Pharmacy:   [facility] [address] - [place], [place] [address] [place] [address] Phone: [phone] Fax: [phone]  [facility] [phone] - [place], [place] - [address] [place] [address] Phone: [phone] Fax: [phone]  [place], [place] [facility] [address] [place] [address] [place] [address] Phone: [phone] Fax: [phone]  Readmission Risk Interventions No flowsheet data found.

## 2019-04-25 NOTE — Progress Notes (Signed)
Notified by telemetry of heart rate in 140s-160s, a-fib. Heart rate reached 180. Patient resting in bed no complaints of chest pain or dizziness.   Paged MG Baltazar Najjar Received new orders of STAT EKG and Cardizem 10 mg IV.   Charge nurse notified Rapid response nurse Shanon Brow for medication administration  EKG confirms HR Afib in 140s  Cardizem 10 mg administered @ [id number]  Vitals at 0103: BP 126/63, HR 96, Resp 20, SpO2 98% on room air.  Will continue to monitor.

## 2019-04-25 NOTE — Progress Notes (Signed)
Progress Note    [name]  [id number] DOB: 17-Mar-1932  DOA: 04/24/2019 PCP: Leonard Downing, MD    Brief Narrative:   Chief complaint: fall/?syncope  Medical records reviewed and are as summarized below:  [name] is an 86 y.o. 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  Circumstances of fall unclear.  Patient's neighbors noted they had not seen him for several days called police for a well check he was found on the floor.  Unable to recall circumstances of when or how he fell.  Work-up in the emergency department included a CT of the head/C-spine/maxillofacial revealing nondisplaced fracture of the lateral and posterior walls of the right maxillary sinus as well as elevated CK.  He was admitted and during the night developed A. fib with RVR.  Provided with IV fluids Cardizem bolus.  Cardiology consult requested  Assessment/Plan:   Principal Problem:   Syncope Active Problems:   Gait abnormality   Fall   Right maxillary fracture (HCC)   Elevated CK   Atrial fibrillation (HCC)   Abnormal MRI of head   Hypernatremia   Essential hypertension   History of pulmonary embolism   Hyperglycemia   Elevated LFTs   #1.  Syncope/fall.  Etiology unclear.  History of dizziness and gait instability.  Outpatient vestibular rehab not helping according to chart.  Home medications also include Valium.  Injury seems to point to a syncopal episode however patient has a history of an unsteady gait and frequent falls being followed by neurology.  CT of the head without acute intracranial abnormality, unchanged appearance of enlarged lateral ventricles no fracture of C-spine.  CT angio of the chest negative for PE.  Echo reveals an EF of 50 to 55% with grade 1 diastolic dysfunction.  No signs of infection  he does appear dehydrated.  No metabolic derangements. -PT  -Check orthostatic vital signs -Gentle IV fluids  #2.  Atrial fibrillation.  New onset.  No history of same.  Given a Cardizem bolus during the night.  Cardizem 30 mg started.  Echo as noted above.  tSh within the limits of normal. -Continue Cardizem -Continue IV fluids -Of note MRI of the brain noted small remote lacunar infarcts involving right caudate, right thalamus, and subcortical left parietal lobe. Likely need anticoagulation? -defer anticoagulation to cardiology  #3.  Hypernatremia.  Mild.  Sodium 146.  Likely related to dehydration. -Change fluid half-normal saline -Recheck in the morning  #4.  Hyperglycemia.  Serum glucose 162 on admission.  No history of diabetes.  Serum glucose 143 this morning.  Likely related to above.  Is still unclear how long he was on the ground. -Gentle IV fluids -Recheck in the morning  #5.  Elevated CK.  Query rhabdomyolysis.  Still unclear how long he was on the ground.  CK greater than 2000.  Creatinine within the limits of normal. -Vigorous IV fluids -Recheck in the morning  #6.  Elevated LFTs.  Etiology unclear.  May be related to above.  AST 93, ALT 62, total bilirubin 6.2. -IV fluids -Recheck in the morning  #7.  Right maxillary  Fracture. -Supportive care  #8.  Gait abnormality.  Patient has had this for a while.  Followed by neurology.  Chart review indicates vestibular rehab in the outpatient setting not much help.  Neuro recommended he advance from cane to a walker during recent visit.  Also planning for EMG of lower extremities testing.  Home medications include copper supplement as well as B12 -PT -Continue B12  #9.  Hypertension.  Fair control.  Home medications include losartan. -Holding losartan for now -Monitor  #10.  History of PE.  Chart review indicates he had PE 2012.  He completed 6 months of Coumadin was on aspirin alone.  CT angio of the chest negative for  PTE.  Patient's not hypoxic   Family Communication/Anticipated D/C date and plan/Code Status   DVT prophylaxis: Lovenox ordered. Code Status: Full Code.  Family Communication: tried to call sister but could not get through Disposition Plan: pt recommending snf.    Medical Consultants:    cardiology   Anti-Infectives:    None  Subjective:   Patient awake alert oriented to self and place requesting water.  Objective:    Vitals:   04/25/19 0011 04/25/19 0103 04/25/19 0447 04/25/19 0754  BP: 125/84 126/63 121/71 140/86  Pulse: 73 96 (!) 101 (!) 108  Resp: 18 20 18 17   Temp:  97.7 F (36.5 C) 98.9 F (37.2 C) 99.8 F (37.7 C)  TempSrc:   Oral Oral  SpO2: 97% 98% 95% 93%  Height:        Intake/Output Summary (Last 24 hours) at 04/25/2019 1248 Last data filed at 04/25/2019 0300 Gross per 24 hour  Intake 1388.1 ml  Output 600 ml  Net 788.1 ml   There were no vitals filed for this visit.  Exam: General: Awake alert well-nourished no acute distress right side of his face with some erythema and swelling CV irregularly irregular no murmur gallop or rub trace lower extremity edema Respiratory: No increased work of breathing breath sounds are slightly distant but I hear no wheezes no crackles Abdomen: Soft nondistended positive bowel sounds throughout nontender to palpation no guarding or rebounding Musculoskeletal: Joints without swelling/erythema right hand is swollen with some bruising and tender to palpation Neuro: Awake alert oriented to self and place follows commands able to make wants and needs known has no recollection of the event  Data Reviewed:   I have personally reviewed following labs and imaging studies:  Labs: Labs show the following:   Basic Metabolic Panel: Recent Labs  Lab 04/24/19 1550 04/24/19 1550 04/24/19 1721 04/25/19 0324  NA 144  --  144 146*  K 4.1   < > 4.2 3.6  CL 109  --  111 111  CO2 22  --   --  23  GLUCOSE 162*  --  150*  143*  BUN 33*  --  38* 29*  CREATININE 1.04  --  0.80 0.86  CALCIUM 8.6*  --   --  8.1*   < > = values in this interval not displayed.   GFR CrCl cannot be calculated (Unknown ideal weight.). Liver Function Tests: Recent Labs  Lab 04/24/19 1550  AST 93*  ALT 62*  ALKPHOS 71  BILITOT 1.6*  PROT 7.0  ALBUMIN 3.6   No results for input(s): LIPASE, AMYLASE in the last 168 hours. No results for input(s): AMMONIA in the last 168 hours. Coagulation profile Recent Labs  Lab 04/24/19 1550  INR 1.1    CBC: Recent Labs  Lab 04/24/19 1550 04/24/19 1721 04/25/19 0324  WBC 12.8*  --  9.9  NEUTROABS 10.6*  --   --   HGB 17.0 17.0 15.1  HCT 51.7 50.0 45.9  MCV  102.0*  --  101.3*  PLT 224  --  185   Cardiac Enzymes: Recent Labs  Lab 04/24/19 1550 04/25/19 0324  CKTOTAL 2,166* 2,295*   BNP (last 3 results) No results for input(s): PROBNP in the last 8760 hours. CBG: Recent Labs  Lab 04/25/19 0633  GLUCAP 128*   D-Dimer: Recent Labs    04/25/19 0923  DDIMER 4.06*   Hgb A1c: No results for input(s): HGBA1C in the last 72 hours. Lipid Profile: No results for input(s): CHOL, HDL, LDLCALC, TRIG, CHOLHDL, LDLDIRECT in the last 72 hours. Thyroid function studies: Recent Labs    04/25/19 0923  TSH 1.308   Anemia work up: No results for input(s): VITAMINB12, FOLATE, FERRITIN, TIBC, IRON, RETICCTPCT in the last 72 hours. Sepsis Labs: Recent Labs  Lab 04/24/19 1550 04/24/19 1644 04/24/19 1822 04/25/19 0324  WBC 12.8*  --   --  9.9  LATICACIDVEN  --  2.5* 2.0*  --     Microbiology Recent Results (from the past 240 hour(s))  Blood Culture (routine x 2)     Status: None (Preliminary result)   Collection Time: 04/24/19  6:03 PM   Specimen: BLOOD  Result Value Ref Range Status   Specimen Description BLOOD RIGHT ANTECUBITAL  Final   Special Requests   Final    BOTTLES DRAWN AEROBIC AND ANAEROBIC Blood Culture results may not be optimal due to an inadequate  volume of blood received in culture bottles   Culture   Final    NO GROWTH < 12 HOURS Performed at [facility] [address], [place], [place] [address]    Report Status PENDING  Incomplete  Blood Culture (routine x 2)     Status: None (Preliminary result)   Collection Time: 04/24/19  6:03 PM   Specimen: BLOOD  Result Value Ref Range Status   Specimen Description BLOOD RIGHT UPPER ARM  Final   Special Requests   Final    BOTTLES DRAWN AEROBIC AND ANAEROBIC Blood Culture results may not be optimal due to an inadequate volume of blood received in culture bottles   Culture   Final    NO GROWTH < 12 HOURS Performed at [facility], [place] [address] [place], [place] [address]    Report Status PENDING  Incomplete  SARS CORONAVIRUS 2 (TAT 6-24 HRS) Nasopharyngeal Nasopharyngeal Swab     Status: None   Collection Time: 04/24/19  6:30 PM   Specimen: Nasopharyngeal Swab  Result Value Ref Range Status   SARS Coronavirus 2 NEGATIVE NEGATIVE Final    Comment: (NOTE) SARS-CoV-2 target nucleic acids are NOT DETECTED. The SARS-CoV-2 RNA is generally detectable in upper and lower respiratory specimens during the acute phase of infection. Negative results do not preclude SARS-CoV-2 infection, do not rule out co-infections with other pathogens, and should not be used as the sole basis for treatment or other patient management decisions. Negative results must be combined with clinical observations, patient history, and epidemiological information. The expected result is Negative. Fact Sheet for Patients: SugarRoll.be Fact Sheet for Healthcare Providers: https://www.woods-mathews.com/ This test is not yet approved or cleared by the [place] FDA and  has been authorized for detection and/or diagnosis of SARS-CoV-2 by FDA under an Emergency Use Authorization (EUA). This EUA will remain  in effect (meaning this test can be used) for the  duration of the COVID-19 declaration under Section 56 4(b)(1) of the Act, 21 U.S.C. section 360bbb-3(b)(1), unless the authorization is terminated or revoked  sooner. Performed at [facility], [place] [address], [place], [place] [address]     Procedures and diagnostic studies:  DG Chest 1 View  Result Date: 04/24/2019 CLINICAL DATA:  86 year old male found on the floor. EXAM: CHEST  1 VIEW COMPARISON:  Chest radiograph dated 07/31/2010. FINDINGS: There is mild eventration of the right hemidiaphragm. No focal consolidation, pleural effusion, or pneumothorax. Minimal left lung base atelectasis. The cardiac silhouette is within normal limits. Atherosclerotic calcification of the aorta. No acute osseous pathology. IMPRESSION: No active disease. Electronically Signed   By: Anner Crete M.D.   On: 04/24/2019 17:26   DG Pelvis 1-2 Views  Result Date: 04/24/2019 CLINICAL DATA:  Unwitnessed fall. Found on floor. EXAM: PELVIS - 1-2 VIEW COMPARISON:  None. FINDINGS: Right hip arthroplasty in expected alignment. No periprosthetic lucency or fracture. Bony pelvis is intact. No acute pelvic fracture. Postsurgical change in the lower lumbar spine is partially included. Mild left hip osteoarthritis. IMPRESSION: No pelvic fracture. Right hip arthroplasty in expected alignment without complication. Electronically Signed   By: Keith Rake M.D.   On: 04/24/2019 17:28   DG Elbow Complete Right  Result Date: 04/24/2019 CLINICAL DATA:  Unwitnessed fall. Found on the floor. EXAM: RIGHT ELBOW - COMPLETE 3+ VIEW COMPARISON:  None. FINDINGS: There is no evidence of fracture, dislocation, or joint effusion. Tiny olecranon spur. There is no evidence of arthropathy or other focal bone abnormality. Mild soft tissue edema. IMPRESSION: Mild soft tissue edema without osseous abnormality. Electronically Signed   By: Keith Rake M.D.   On: 04/24/2019 17:27   DG Wrist Complete Right  Result Date:  04/24/2019 CLINICAL DATA:  Unwitnessed fall. Found on floor. EXAM: RIGHT WRIST - COMPLETE 3+ VIEW COMPARISON:  None. FINDINGS: There is no evidence of fracture or dislocation. There is no evidence of arthropathy or other focal bone abnormality. Generalized soft tissue edema. IMPRESSION: Soft tissue edema without acute osseous abnormality. Electronically Signed   By: Keith Rake M.D.   On: 04/24/2019 17:29   CT Head Wo Contrast  Result Date: 04/24/2019 CLINICAL DATA:  Facial trauma, altered mental status, uncertain cause EXAM: CT HEAD WITHOUT CONTRAST CT MAXILLOFACIAL WITHOUT CONTRAST CT CERVICAL SPINE WITHOUT CONTRAST TECHNIQUE: Multidetector CT imaging of the head, cervical spine, and maxillofacial structures were performed using the standard protocol without intravenous contrast. Multiplanar CT image reconstructions of the cervical spine and maxillofacial structures were also generated. COMPARISON:  MR brain, 06/01/2018 FINDINGS: CT HEAD FINDINGS Brain: No evidence of acute infarction, hemorrhage, hydrocephalus, extra-axial collection or mass lesion/mass effect. Extensive periventricular and deep white matter hypodensity, global volume loss, and enlargement of the lateral ventricles, likely ex vacuo. Vascular: No hyperdense vessel or unexpected calcification. CT FACIAL BONES FINDINGS Skull: Normal. Negative for fracture or focal lesion. Facial bones: Suspect nondisplaced fracture of the lateral and posterior walls of the right maxillary sinus (series 10, image 46). Sinuses/Orbits: No acute finding. Mucosal thickening in the maxillary sinuses and ethmoid air cells. No air-fluid levels. Other: Soft tissue contusion overlying the right forehead, right scalp, and right cheek. CT CERVICAL SPINE FINDINGS Alignment: Normal. Skull base and vertebrae: No acute fracture. No primary bone lesion or focal pathologic process. Soft tissues and spinal canal: No prevertebral fluid or swelling. No visible canal hematoma.  Disc levels: Mild to moderate multilevel disc space height loss and osteophytosis. Upper chest: Negative. Other: Postoperative findings of right cervical dissection IMPRESSION: 1.  No acute intracranial pathology. 2. Extensive small-vessel white matter disease and global  severe volume loss. 3. Enlarged lateral ventricles, generally unchanged in appearance and configuration compared to prior examination, likely ex vacuo. 4. No displaced fractures of the facial bones. Suspect nondisplaced fracture of the lateral and posterior walls of the right maxillary sinus (series 10, image 46). 5. Soft tissue overlying the right forehead, right scalp, and right cheek. 6.  No fracture or static subluxation of the cervical spine. 7.  Postoperative findings of right cervical dissection. Electronically Signed   By: Eddie Candle M.D.   On: 04/24/2019 18:07   CT ANGIO CHEST PE W OR WO CONTRAST  Result Date: 04/25/2019 CLINICAL DATA:  Syncope. EXAM: CT ANGIOGRAPHY CHEST WITH CONTRAST TECHNIQUE: Multidetector CT imaging of the chest was performed using the standard protocol during bolus administration of intravenous contrast. Multiplanar CT image reconstructions and MIPs were obtained to evaluate the vascular anatomy. CONTRAST:  141mL OMNIPAQUE IOHEXOL 350 MG/ML SOLN COMPARISON:  Jul 25, 2010. FINDINGS: Cardiovascular: Satisfactory opacification of the pulmonary arteries to the segmental level. No evidence of pulmonary embolism. Normal heart size. No pericardial effusion. Atherosclerosis of thoracic aorta is noted without aneurysm formation. Coronary artery calcifications are noted. Mediastinum/Nodes: No enlarged mediastinal, hilar, or axillary lymph nodes. Thyroid gland, trachea, and esophagus demonstrate no significant findings. Lungs/Pleura: No pneumothorax pleural effusion is noted. Minimal subsegmental atelectasis is noted in the right lower lobe. Upper Abdomen: No acute abnormality. Musculoskeletal: No chest wall abnormality.  No acute or significant osseous findings. Review of the MIP images confirms the above findings. IMPRESSION: 1. No definite evidence of pulmonary embolus. 2. Coronary artery calcifications are noted suggesting coronary artery disease. 3. Minimal subsegmental atelectasis is noted in the right lower lobe. Aortic Atherosclerosis (ICD10-I70.0). Electronically Signed   By: Marijo Conception M.D.   On: 04/25/2019 12:40   CT Cervical Spine Wo Contrast  Result Date: 04/24/2019 CLINICAL DATA:  Facial trauma, altered mental status, uncertain cause EXAM: CT HEAD WITHOUT CONTRAST CT MAXILLOFACIAL WITHOUT CONTRAST CT CERVICAL SPINE WITHOUT CONTRAST TECHNIQUE: Multidetector CT imaging of the head, cervical spine, and maxillofacial structures were performed using the standard protocol without intravenous contrast. Multiplanar CT image reconstructions of the cervical spine and maxillofacial structures were also generated. COMPARISON:  MR brain, 06/01/2018 FINDINGS: CT HEAD FINDINGS Brain: No evidence of acute infarction, hemorrhage, hydrocephalus, extra-axial collection or mass lesion/mass effect. Extensive periventricular and deep white matter hypodensity, global volume loss, and enlargement of the lateral ventricles, likely ex vacuo. Vascular: No hyperdense vessel or unexpected calcification. CT FACIAL BONES FINDINGS Skull: Normal. Negative for fracture or focal lesion. Facial bones: Suspect nondisplaced fracture of the lateral and posterior walls of the right maxillary sinus (series 10, image 46). Sinuses/Orbits: No acute finding. Mucosal thickening in the maxillary sinuses and ethmoid air cells. No air-fluid levels. Other: Soft tissue contusion overlying the right forehead, right scalp, and right cheek. CT CERVICAL SPINE FINDINGS Alignment: Normal. Skull base and vertebrae: No acute fracture. No primary bone lesion or focal pathologic process. Soft tissues and spinal canal: No prevertebral fluid or swelling. No visible canal  hematoma. Disc levels: Mild to moderate multilevel disc space height loss and osteophytosis. Upper chest: Negative. Other: Postoperative findings of right cervical dissection IMPRESSION: 1.  No acute intracranial pathology. 2. Extensive small-vessel white matter disease and global severe volume loss. 3. Enlarged lateral ventricles, generally unchanged in appearance and configuration compared to prior examination, likely ex vacuo. 4. No displaced fractures of the facial bones. Suspect nondisplaced fracture of the lateral and posterior walls of the right maxillary  sinus (series 10, image 46). 5. Soft tissue overlying the right forehead, right scalp, and right cheek. 6.  No fracture or static subluxation of the cervical spine. 7.  Postoperative findings of right cervical dissection. Electronically Signed   By: Eddie Candle M.D.   On: 04/24/2019 18:07   MR BRAIN WO CONTRAST  Result Date: 04/24/2019 CLINICAL DATA:  Initial evaluation for acute encephalopathy, found down. EXAM: MRI HEAD WITHOUT CONTRAST TECHNIQUE: Multiplanar, multiecho pulse sequences of the brain and surrounding structures were obtained without intravenous contrast. COMPARISON:  Comparison made with prior CT from earlier same day. FINDINGS: Brain: Diffuse prominence of the CSF containing spaces compatible generalized age-related cerebral atrophy. Minimal T2/FLAIR hyperintensity within the periventricular white matter, nonspecific, but most likely related chronic microvascular ischemic disease, felt to be within normal limits for age. Small remote lacunar infarcts present at the right caudate, right thalamus, and posterior left subcortical white matter. Associated small chronic microhemorrhage noted about the left-sided chronic lacunar infarct (series 14, image 35). No abnormal foci of restricted diffusion to suggest acute or subacute ischemia. Gray-white matter differentiation maintained. No encephalomalacia to suggest chronic cortical infarction.  No other evidence for acute or chronic intracranial hemorrhage. No mass lesion, midline shift or mass effect. Diffuse ventricular prominence, not significantly changed as compared to previous, most likely related to global parenchymal volume loss. No evidence for transependymal flow of CSF. No extra-axial fluid collection. Pituitary gland suprasellar region normal. Midline structures intact. Vascular: Major intracranial vascular flow voids are maintained. Skull and upper cervical spine: Craniocervical junction within normal limits. Upper cervical spine unremarkable. Bone marrow signal intensity within normal limits. Soft tissue contusion/edema seen involving the right frontotemporal scalp. Sinuses/Orbits: Patient status post bilateral ocular lens replacement. Scattered mucosal thickening noted throughout the paranasal sinuses. Superimposed small retention cyst noted within the left maxillary sinus. No significant mastoid effusion. Inner ear structures grossly normal. Other: None. IMPRESSION: 1. No acute intracranial abnormality. 2. Soft tissue contusion involving the right frontotemporal scalp. 3. Age-related cerebral atrophy with small remote lacunar infarcts involving the right caudate, right thalamus, and subcortical left parietal lobe. 4. Diffuse ventricular prominence, similar to previous, and most likely related to global parenchymal volume loss. Electronically Signed   By: Jeannine Boga M.D.   On: 04/24/2019 22:52   ECHOCARDIOGRAM COMPLETE  Result Date: 04/25/2019    ECHOCARDIOGRAM REPORT   Patient Name:   [name] Date of Exam: 04/25/2019 Medical Rec #:  [id number]    Height:       70.0 in Accession #:    [id number]   Weight:       220.0 lb Date of Birth:  1932-03-09   BSA:          2.174 m Patient Age:    63 years     BP:           140/86 mmHg Patient Gender: M            HR:           107 bpm. Exam Location:  Inpatient Procedure: 2D Echo, Cardiac Doppler and Color Doppler Indications:     Syncope 780.2  History:        Patient has no prior history of Echocardiogram examinations.                 Signs/Symptoms:Dyspnea; Risk Factors:Hypertension and Former                 Smoker. Pulmonary embolism.  Sonographer:  Vickie Epley RDCS Referring Phys: [id number] VISHAL R PATEL  Sonographer Comments: Technically difficult study due to poor echo windows. IMPRESSIONS  1. Technically difficult echo with poor image quality.  2. Left ventricular ejection fraction, by estimation, is 50 to 55%. The left ventricle has low normal function. The left ventricle has no regional wall motion abnormalities. Left ventricular diastolic parameters are consistent with Grade I diastolic dysfunction (impaired relaxation).  3. Right ventricular systolic function is normal. The right ventricular size is normal. There is normal pulmonary artery systolic pressure.  4. The mitral valve is normal in structure and function. No evidence of mitral valve regurgitation. No evidence of mitral stenosis.  5. The aortic valve is normal in structure and function. Aortic valve regurgitation is not visualized. No aortic stenosis is present. FINDINGS  Left Ventricle: Left ventricular ejection fraction, by estimation, is 50 to 55%. The left ventricle has low normal function. The left ventricle has no regional wall motion abnormalities. The left ventricular internal cavity size was normal in size. There is no left ventricular hypertrophy. Left ventricular diastolic parameters are consistent with Grade I diastolic dysfunction (impaired relaxation). Right Ventricle: The right ventricular size is normal. No increase in right ventricular wall thickness. Right ventricular systolic function is normal. There is normal pulmonary artery systolic pressure. The tricuspid regurgitant velocity is 2.33 m/s, and  with an assumed right atrial pressure of 8 mmHg, the estimated right ventricular systolic pressure is [id number] mmHg. Left Atrium: Left atrial size was normal  in size. Right Atrium: Right atrial size was normal in size. Pericardium: Trivial pericardial effusion is present. Mitral Valve: The mitral valve is normal in structure and function. No evidence of mitral valve regurgitation. No evidence of mitral valve stenosis. Tricuspid Valve: The tricuspid valve is grossly normal. Tricuspid valve regurgitation is trivial. Aortic Valve: The aortic valve is normal in structure and function. Aortic valve regurgitation is not visualized. No aortic stenosis is present. Pulmonic Valve: The pulmonic valve was not well visualized. Pulmonic valve regurgitation is not visualized. Aorta: The aortic root and ascending aorta are structurally normal, with no evidence of dilitation. IAS/Shunts: The atrial septum is grossly normal. Additional Comments: Technically difficult echo with poor image quality.  LEFT VENTRICLE PLAX 2D LVIDd:         4.60 cm LVIDs:         3.50 cm LV PW:         0.90 cm LV IVS:        0.90 cm LVOT diam:     1.90 cm LV SV:         40 LV SV Index:   18 LVOT Area:     2.84 cm  LV Volumes (MOD) LV vol d, MOD A4C: 72.5 ml LV vol s, MOD A4C: 35.2 ml LV SV MOD A4C:     72.5 ml RIGHT VENTRICLE TAPSE (M-mode): 1.8 cm LEFT ATRIUM           Index LA diam:      2.90 cm 1.33 cm/m LA Vol (A4C): 25.7 ml 11.82 ml/m  AORTIC VALVE LVOT Vmax:   85.50 cm/s LVOT Vmean:  57.400 cm/s LVOT VTI:    0.141 m  AORTA Ao Root diam: 3.80 cm Ao Asc diam:  3.40 cm MITRAL VALVE               TRICUSPID VALVE MV Area (PHT): 3.42 cm    TR Peak grad:   21.7 mmHg MV Decel Time: 222 msec  TR Vmax:        233.00 cm/s MV E velocity: 50.60 cm/s MV A velocity: 70.30 cm/s  SHUNTS MV E/A ratio:  0.72        Systemic VTI:  0.14 m                            Systemic Diam: 1.90 cm Mertie Moores MD Electronically signed by Mertie Moores MD Signature Date/Time: 04/25/2019/10:38:13 AM    Final    CT Maxillofacial WO CM  Result Date: 04/24/2019 CLINICAL DATA:  Facial trauma, altered mental status, uncertain cause  EXAM: CT HEAD WITHOUT CONTRAST CT MAXILLOFACIAL WITHOUT CONTRAST CT CERVICAL SPINE WITHOUT CONTRAST TECHNIQUE: Multidetector CT imaging of the head, cervical spine, and maxillofacial structures were performed using the standard protocol without intravenous contrast. Multiplanar CT image reconstructions of the cervical spine and maxillofacial structures were also generated. COMPARISON:  MR brain, 06/01/2018 FINDINGS: CT HEAD FINDINGS Brain: No evidence of acute infarction, hemorrhage, hydrocephalus, extra-axial collection or mass lesion/mass effect. Extensive periventricular and deep white matter hypodensity, global volume loss, and enlargement of the lateral ventricles, likely ex vacuo. Vascular: No hyperdense vessel or unexpected calcification. CT FACIAL BONES FINDINGS Skull: Normal. Negative for fracture or focal lesion. Facial bones: Suspect nondisplaced fracture of the lateral and posterior walls of the right maxillary sinus (series 10, image 46). Sinuses/Orbits: No acute finding. Mucosal thickening in the maxillary sinuses and ethmoid air cells. No air-fluid levels. Other: Soft tissue contusion overlying the right forehead, right scalp, and right cheek. CT CERVICAL SPINE FINDINGS Alignment: Normal. Skull base and vertebrae: No acute fracture. No primary bone lesion or focal pathologic process. Soft tissues and spinal canal: No prevertebral fluid or swelling. No visible canal hematoma. Disc levels: Mild to moderate multilevel disc space height loss and osteophytosis. Upper chest: Negative. Other: Postoperative findings of right cervical dissection IMPRESSION: 1.  No acute intracranial pathology. 2. Extensive small-vessel white matter disease and global severe volume loss. 3. Enlarged lateral ventricles, generally unchanged in appearance and configuration compared to prior examination, likely ex vacuo. 4. No displaced fractures of the facial bones. Suspect nondisplaced fracture of the lateral and posterior walls  of the right maxillary sinus (series 10, image 46). 5. Soft tissue overlying the right forehead, right scalp, and right cheek. 6.  No fracture or static subluxation of the cervical spine. 7.  Postoperative findings of right cervical dissection. Electronically Signed   By: Eddie Candle M.D.   On: 04/24/2019 18:07    Medications:   . diltiazem  30 mg Oral Q6H  . sodium chloride flush  3 mL Intravenous Q12H  . vitamin B-12  1,000 mcg Oral Daily   Continuous Infusions: . sodium chloride 125 mL/hr at 04/25/19 0909     LOS: 0 days   Radene Gunning NP [facility]   How to contact the [facility] Attending or Consulting provider [place] or covering provider during after hours [place], for this patient?  1. Check the care team in [facility] and look for a) attending/consulting [facility] provider listed and b) the [facility] [place] team listed 2. Log into [email or web address] and use [place]'s universal password to access. If you do not have the password, please contact the hospital operator. 3. Locate the [facility] provider you are looking for under [facility] and page to a number that you can be directly reached. 4. If you still have difficulty reaching the provider, please page the [facility] (Director  on Call) for the Hospitalists listed on amion for assistance.  04/25/2019, 12:48 PM

## 2019-04-25 NOTE — Progress Notes (Signed)
  Echocardiogram 2D Echocardiogram has been performed.  [name] 04/25/2019, 9:05 AM

## 2019-04-26 ENCOUNTER — Other Ambulatory Visit: Payer: Self-pay

## 2019-04-26 DIAGNOSIS — R930 Abnormal findings on diagnostic imaging of skull and head, not elsewhere classified: Secondary | ICD-10-CM

## 2019-04-26 DIAGNOSIS — R7989 Other specified abnormal findings of blood chemistry: Secondary | ICD-10-CM

## 2019-04-26 LAB — COMPREHENSIVE METABOLIC PANEL
ALT: 50 U/L — ABNORMAL HIGH (ref 0–44)
AST: 68 U/L — ABNORMAL HIGH (ref 15–41)
Albumin: 2.5 g/dL — ABNORMAL LOW (ref 3.5–5.0)
Alkaline Phosphatase: 45 U/L (ref 38–126)
Anion gap: 9 (ref 5–15)
BUN: 24 mg/dL — ABNORMAL HIGH (ref 8–23)
CO2: 23 mmol/L (ref 22–32)
Calcium: 7.7 mg/dL — ABNORMAL LOW (ref 8.9–10.3)
Chloride: 112 mmol/L — ABNORMAL HIGH (ref 98–111)
Creatinine, Ser: 0.9 mg/dL (ref 0.61–1.24)
GFR calc Af Amer: >60 mL/min (ref >60)
GFR calc non Af Amer: >60 mL/min (ref >60)
Glucose, Bld: 120 mg/dL — ABNORMAL HIGH (ref 70–99)
Potassium: 3.4 mmol/L — ABNORMAL LOW (ref 3.5–5.1)
Sodium: 144 mmol/L (ref 135–145)
Total Bilirubin: 1.1 mg/dL (ref 0.3–1.2)
Total Protein: 5.2 g/dL — ABNORMAL LOW (ref 6.5–8.1)

## 2019-04-26 LAB — CBC
HCT: 41.6 % (ref 39.0–52.0)
Hemoglobin: 13.7 g/dL (ref 13.0–17.0)
MCH: 33.8 pg (ref 26.0–34.0)
MCHC: 32.9 g/dL (ref 30.0–36.0)
MCV: 102.7 fL — ABNORMAL HIGH (ref 80.0–100.0)
Platelets: 149 10*3/uL — ABNORMAL LOW (ref 150–400)
RBC: 4.05 MIL/uL — ABNORMAL LOW (ref 4.22–5.81)
RDW: 13.2 % (ref 11.5–15.5)
WBC: 8.7 10*3/uL (ref 4.0–10.5)
nRBC: 0 % (ref 0.0–0.2)

## 2019-04-26 LAB — CK: Total CK: 1135 U/L — ABNORMAL HIGH (ref 49–397)

## 2019-04-26 MED ORDER — ASCORBIC ACID 500 MG PO TABS
1000.0000 mg | ORAL_TABLET | Freq: Every day | ORAL | Status: DC
  Administered 2019-04-28: 1000 mg via ORAL
  Filled 2019-04-26 (×3): qty 2

## 2019-04-26 MED ORDER — VITAMIN E 180 MG (400 UNIT) PO CAPS
400.0000 [IU] | ORAL_CAPSULE | Freq: Every day | ORAL | Status: DC
  Administered 2019-04-28: 10:00:00 400 [IU] via ORAL
  Filled 2019-04-26 (×3): qty 1

## 2019-04-26 MED ORDER — LATANOPROST 0.005 % OP SOLN
1.0000 [drp] | Freq: Every day | OPHTHALMIC | Status: DC
  Administered 2019-04-26 – 2019-04-27 (×2): 1 [drp] via OPHTHALMIC
  Filled 2019-04-26: qty 2.5

## 2019-04-26 MED ORDER — VITAMIN D 25 MCG (1000 UNIT) PO TABS
1000.0000 [IU] | ORAL_TABLET | Freq: Every day | ORAL | Status: DC
  Administered 2019-04-28: 1000 [IU] via ORAL
  Filled 2019-04-26 (×3): qty 1

## 2019-04-26 MED ORDER — DILTIAZEM HCL ER COATED BEADS 120 MG PO CP24
120.0000 mg | ORAL_CAPSULE | Freq: Every day | ORAL | Status: DC
  Administered 2019-04-27 – 2019-04-28 (×2): 120 mg via ORAL
  Filled 2019-04-26 (×3): qty 1

## 2019-04-26 MED ORDER — DIAZEPAM 2 MG PO TABS
2.0000 mg | ORAL_TABLET | Freq: Two times a day (BID) | ORAL | Status: DC
  Administered 2019-04-26 – 2019-04-28 (×4): 2 mg via ORAL
  Filled 2019-04-26 (×5): qty 1

## 2019-04-26 MED ORDER — DIPHENHYDRAMINE HCL 25 MG PO CAPS
25.0000 mg | ORAL_CAPSULE | Freq: Every day | ORAL | Status: DC | PRN
  Filled 2019-04-26 (×2): qty 1

## 2019-04-26 MED ORDER — MIRTAZAPINE 15 MG PO TABS
30.0000 mg | ORAL_TABLET | Freq: Every day | ORAL | Status: DC
  Administered 2019-04-26 – 2019-04-27 (×2): 30 mg via ORAL
  Filled 2019-04-26 (×2): qty 2

## 2019-04-26 NOTE — Plan of Care (Signed)
  Problem: Education: Goal: Knowledge of General Education information will improve Description: Including pain rating scale, medication(s)/side effects and non-pharmacologic comfort measures Outcome: Progressing   Problem: Clinical Measurements: Goal: Ability to maintain clinical measurements within normal limits will improve Outcome: Progressing Goal: Diagnostic test results will improve Outcome: Progressing   Problem: Activity: Goal: Risk for activity intolerance will decrease Outcome: Progressing   Problem: Nutrition: Goal: Adequate nutrition will be maintained Outcome: Progressing   

## 2019-04-26 NOTE — TOC Initial Note (Signed)
Transition of Care [facility]) - Initial/Assessment Note    Patient Details  Name: [name] MRN: [id number] Date of Birth: 1932-12-15  Transition of Care [facility]) CM/SW Contact:    Marilu Favre, RN Phone Number: 04/26/2019, 11:28 AM  Clinical Narrative:                 Patient confused, see TOC note from yesterday.   Called patient son Paris Lore [phone]. Synetta Shadow lives in [place] , and plans to be in [place] tomorrow 04/27/19. Discussed PT note and recommendations. Synetta Shadow not sure he wants his father to go to a SNF, Synetta Shadow understands father is confused and it is recommended he have 24 hour supervision.   Synetta Shadow consented for NCM to fax [place]@ to SNF and to start insurance authorization.  Expected Discharge Plan: Skilled Nursing Facility Barriers to Discharge: Continued Medical Work up   Patient Goals and CMS Choice   CMS Medicare.gov Compare Post Acute Care list provided to:: Patient Represenative (must comment)(pt son) Choice offered to / list presented to : Sibling, Adult Children, Patient  Expected Discharge Plan and Services Expected Discharge Plan: [place] In-house Referral: Clinical Social Work Discharge Planning Services: CM Consult Post Acute Care Choice: (TBD; SNF vs HH) Living arrangements for the past 2 months: Single Family Home                                      Prior Living Arrangements/Services Living arrangements for the past 2 months: Single Family Home Lives with:: Self Patient language and need for interpreter reviewed:: Yes Do you feel safe going back to the place where you live?: (pt only oriented to person and place)      Need for Family Participation in Patient Care: Yes (Comment)(assistance with daily cares as needed) Care giver support system in place?: Yes (comment)(sister) Current home services: DME Criminal Activity/Legal Involvement Pertinent to Current Situation/Hospitalization: No - Comment as needed  Activities of  Daily Living Home Assistive Devices/Equipment: None ADL Screening (condition at time of admission) Patient's cognitive ability adequate to safely complete daily activities?: Yes Is the patient deaf or have difficulty hearing?: No Does the patient have difficulty seeing, even when wearing glasses/contacts?: Yes Does the patient have difficulty concentrating, remembering, or making decisions?: Yes Patient able to express need for assistance with ADLs?: Yes Does the patient have difficulty dressing or bathing?: Yes Independently performs ADLs?: No Does the patient have difficulty walking or climbing stairs?: Yes Weakness of Legs: Both Weakness of Arms/Hands: Both  Permission Sought/Granted Permission sought to share information with : Family Supports Permission granted to share information with : No(pt with fluctuating orientation (person and place))  Share Information with NAME: Paris Lore [id number]  Permission granted to share info w AGENCY: SNFs  Permission granted to share info w Relationship: sister; son  Permission granted to share info w Contact Information: [phone]; [phone]  Emotional Assessment Appearance:: Other (Comment Required(telephonic assessment with pt sister) Attitude/Demeanor/Rapport: (telephonic assessment with pt sister) Affect (typically observed): (telephonic assessment with pt sister) Orientation: : Oriented to Self, Oriented to Place, Fluctuating Orientation (Suspected and/or reported Sundowners) Alcohol / Substance Use: Not Applicable Psych Involvement: No (comment)(not applicable; followed outpatient neurology)  Admission diagnosis:  Pain [R52] Fall [W19.XXXA] New onset a-fib [facility]) [I48.91] Patient Active Problem List   Diagnosis Date Noted  . Atrial fibrillation ([place]) 04/25/2019  . Abnormal MRI of  head 04/25/2019  . Syncope 04/25/2019  . Hyperglycemia 04/25/2019  . Elevated LFTs 04/25/2019  . Hypernatremia 04/25/2019  . New onset a-fib  ([place]) 04/25/2019  . Fall 04/24/2019  . Essential hypertension 04/24/2019  . History of pulmonary embolism 04/24/2019  . Right maxillary fracture ([place]) 04/24/2019  . Elevated CK 04/24/2019  . Dizziness 09/26/2018  . Gait abnormality 05/23/2018  . Skin cancer of scalp or skin of neck 01/19/2018  . Abnormal chest x-ray 02/17/2011  . Osteoarthritis 11/18/2010  . Pulmonary embolism ([place]) 08/15/2010  . Chronic anticoagulation 08/15/2010  . Lung nodule 08/15/2010  . Cor pulmonale ([place]) 08/15/2010  . Dyspnea 07/25/2010  . Glaucoma   . Back pain   . Skin cancer   . Insomnia    PCP:  Leonard Downing, MD Pharmacy:   [facility] [address] - [place], [place] [address] [place] [address] Phone: [phone] Fax: [phone]  [facility] [phone] - [place], [place] - [address] [place] [address] Phone: [phone] Fax: [phone]  [place], [place] [facility] [address] [place] [address] [place] [address] Phone: [phone] Fax: [phone]     Social Determinants of Health ([place]) Interventions    Readmission Risk Interventions No flowsheet data found.

## 2019-04-26 NOTE — Progress Notes (Signed)
PROGRESS NOTE    [name]  [id number] DOB: 1932-12-06 DOA: 04/24/2019 PCP: Leonard Downing, MD    Brief Narrative:  Patient was admitted to the hospital with a working diagnosis of syncope complicated by right maxillary sinus fracture.  During hospitalization developed atrial fibrillation.  86 year old male who presented after a fall at home.  He does have significant past medical history for pulmonary embolism in 2012, hypertension and gait disturbance.  Patient was found down by EMS, he had right-sided pain but unable to recall details.  Apparently for the last 4 days he was unable to be reached by his neighbors.  On his initial physical examination his blood pressure was 157/85, heart rate 103, respiratory rate 20, temperature 98.5, oxygen saturation 91%.  He had dry mucous membranes, his lungs were clear to auscultation bilaterally, heart S1-S2 present rhythmic, soft abdomen, no lower extremity edema. Head CT no acute changes, positive nondisplaced fractures of the facial bones, suspected nondisplaced fracture of the lateral and posterior walls of the right maxillary sinus.  EKG 91 bpm, left axis deviation, right bundle branch block, sinus rhythm, no ST segment or T wave changes.  Underwent further work-up with CT chest which was negative for pulmonary embolism, echocardiography with preserved LV systolic function.  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   During hospitalization patient developed atrial fibrillation controlled with AV blockade.  Assessment & Plan:   Principal Problem:   Syncope Active Problems:   Gait abnormality   Fall   Essential hypertension   History of pulmonary embolism   Right maxillary fracture (H[facility])   Elevated CK   Atrial fibrillation (HCC)   Abnormal MRI of head   Hyperglycemia   Elevated LFTs   Hypernatremia   New onset a-fib ([place])   1. Syncope with  right maxillary sinus fracture. Patient has received IV fluids, telemetry has reveled atrial fibrillation. PE was ruled out. Echocardiogram with preserved LV systolic function.   Patient with persistent ambulatory dysfunction, will need SNF. Continue pt and ot recommendations.   2. New onset paroxysmal atrial fibrillation. Patient has converted to sinus rhythm, he has refused to have follow up EKG. Will continue telemetry monitoring and will transition to long acting diltiazem. Patient with high thrombotic risk ChadsVasc score of 5, but high fall risk (maxillary sinus fracture), will continue to hold on anticoagulation.   3. Dehydration with hypernatremia. Patient is tolerating po well, renal function is preserved. High CK but not in rhabdomyolysis range.   4. HTN. Continue blood pressure control.   5. Resolved PE. Follow up CT with no PE, patient not on anticoagulation.   6. Depression and anxiety. Will resume diazepam and mirtazapine.   DVT prophylaxis: enoxaparin   Code Status:  full Family Communication: no family at the bedside  Disposition Plan/ discharge barriers: pending placement at SNF.    Consultants:   Cardiology     Subjective: Patient is refusing to be examined or interrogated. No apparent pain or dyspnea.   Objective: Vitals:   04/25/19 1300 04/25/19 2018 04/26/19 0500 04/26/19 0751  BP: (!) 142/87 99/61 140/76 138/65  Pulse: (!) 105 92 79 79  Resp: 18   17  Temp: 98.7 F (37.1 C) 98.6 F (37 C) 97.7 F (36.5 C) 98.2 F (36.8 C)  TempSrc: Oral Oral Oral Oral  SpO2: 97% 92% 94% 95%  Height:        Intake/Output Summary (Last  24 hours) at 04/26/2019 1103 Last data filed at 04/26/2019 0500 Gross per 24 hour  Intake 3320.62 ml  Output 500 ml  Net 2820.62 ml   There were no vitals filed for this visit.  Examination:   General: deconditioned  Neurology: Awake and alert, non focal  E ENT: no pallor, no icterus, oral mucosa moist Cardiovascular: No JVD.  S1-S2 present, rhythmic.  Pulmonary:  Did not allowed auscultation.  Gastrointestinal. Abdomen no signs of distention.  Skin. No rashes Musculoskeletal: no apparent joint deformities     Data Reviewed: I have personally reviewed following labs and imaging studies  CBC: Recent Labs  Lab 04/24/19 1550 04/24/19 1721 04/25/19 0324 04/26/19 0310  WBC 12.8*  --  9.9 8.7  NEUTROABS 10.6*  --   --   --   HGB 17.0 17.0 15.1 13.7  HCT 51.7 50.0 45.9 41.6  MCV 102.0*  --  101.3* 102.7*  PLT 224  --  185 123456*   Basic Metabolic Panel: Recent Labs  Lab 04/24/19 1550 04/24/19 1721 04/25/19 0324 04/26/19 0310  NA 144 144 146* 144  K 4.1 4.2 3.6 3.4*  CL 109 111 111 112*  CO2 22  --  23 23  GLUCOSE 162* 150* 143* 120*  BUN 33* 38* 29* 24*  CREATININE 1.04 0.80 0.86 0.90  CALCIUM 8.6*  --  8.1* 7.7*   GFR: CrCl cannot be calculated (Unknown ideal weight.). Liver Function Tests: Recent Labs  Lab 04/24/19 1550 04/26/19 0310  AST 93* 68*  ALT 62* 50*  ALKPHOS 71 45  BILITOT 1.6* 1.1  PROT 7.0 5.2*  ALBUMIN 3.6 2.5*   No results for input(s): LIPASE, AMYLASE in the last 168 hours. No results for input(s): AMMONIA in the last 168 hours. Coagulation Profile: Recent Labs  Lab 04/24/19 1550  INR 1.1   Cardiac Enzymes: Recent Labs  Lab 04/24/19 1550 04/25/19 0324 04/26/19 0310  CKTOTAL 2,166* 2,295* 1,135*   BNP (last 3 results) No results for input(s): PROBNP in the last 8760 hours. HbA1C: No results for input(s): HGBA1C in the last 72 hours. CBG: Recent Labs  Lab 04/25/19 0633  GLUCAP 128*   Lipid Profile: No results for input(s): CHOL, HDL, LDLCALC, TRIG, CHOLHDL, LDLDIRECT in the last 72 hours. Thyroid Function Tests: Recent Labs    04/25/19 0923  TSH 1.308   Anemia Panel: No results for input(s): VITAMINB12, FOLATE, FERRITIN, TIBC, IRON, RETICCTPCT in the last 72 hours.    Radiology Studies: I have reviewed all of the imaging during this  hospital visit personally     Scheduled Meds: . diltiazem  30 mg Oral Q6H  . sodium chloride flush  3 mL Intravenous Q12H  . vitamin B-12  1,000 mcg Oral Daily   Continuous Infusions: . sodium chloride Stopped (04/25/19 2106)     LOS: 1 day        Layney Gillson Gerome Apley, MD

## 2019-04-27 LAB — BASIC METABOLIC PANEL
Anion gap: 11 (ref 5–15)
BUN: 15 mg/dL (ref 8–23)
CO2: 25 mmol/L (ref 22–32)
Calcium: 8.1 mg/dL — ABNORMAL LOW (ref 8.9–10.3)
Chloride: 105 mmol/L (ref 98–111)
Creatinine, Ser: 0.85 mg/dL (ref 0.61–1.24)
GFR calc Af Amer: >60 mL/min (ref >60)
GFR calc non Af Amer: >60 mL/min (ref >60)
Glucose, Bld: 132 mg/dL — ABNORMAL HIGH (ref 70–99)
Potassium: 4 mmol/L (ref 3.5–5.1)
Sodium: 141 mmol/L (ref 135–145)

## 2019-04-27 LAB — GLUCOSE, CAPILLARY: Glucose-Capillary: 94 mg/dL (ref 70–99)

## 2019-04-27 LAB — SARS CORONAVIRUS 2 (TAT 6-24 HRS): SARS Coronavirus 2: NEGATIVE

## 2019-04-27 MED ORDER — ASPIRIN EC 81 MG PO TBEC
81.0000 mg | DELAYED_RELEASE_TABLET | Freq: Every day | ORAL | Status: DC
  Administered 2019-04-28: 10:00:00 81 mg via ORAL
  Filled 2019-04-27: qty 1

## 2019-04-27 NOTE — Progress Notes (Signed)
PROGRESS NOTE    [name]  [id number] DOB: [birth year] DOA: 04/24/2019 PCP: Leonard Downing, MD    Brief Narrative:  Patient was admitted to the hospital with a working diagnosis of syncope complicated by right maxillary sinus fracture.  During hospitalization developed atrial fibrillation.  86 year old male who presented after a fall at home.  He does have significant past medical history for pulmonary embolism in 2012, hypertension and gait disturbance.  Patient was found down by EMS, he had right-sided pain but unable to recall details.  Apparently for the last 4 days he was unable to be reached by his neighbors.  On his initial physical examination his blood pressure was 157/85, heart rate 103, respiratory rate 20, temperature 98.5, oxygen saturation 91%.  He had dry mucous membranes, his lungs were clear to auscultation bilaterally, heart S1-S2 present rhythmic, soft abdomen, no lower extremity edema. Head CT no acute changes, positive nondisplaced fractures of the facial bones, suspected nondisplaced fracture of the lateral and posterior walls of the right maxillary sinus.  EKG 91 bpm, left axis deviation, right bundle branch block, sinus rhythm, no ST segment or T wave changes.  Underwent further work-up with CT chest which was negative for pulmonary embolism, echocardiography with preserved LV systolic function.  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   During hospitalization patient developed atrial fibrillation controlled with AV blockade. Not candidate for anticoagulation due to fall risk.    Assessment & Plan:   Principal Problem:   Syncope Active Problems:   Gait abnormality   Fall   Essential hypertension   History of pulmonary embolism   Right maxillary fracture (HCC)   Elevated CK   Atrial fibrillation (HCC)   Abnormal MRI of head   Hyperglycemia   Elevated LFTs  Hypernatremia   New onset a-fib ([place])    1. Syncope with right maxillary sinus fracture. Patient has received IV fluids, telemetry has reveled atrial fibrillation. PE was ruled out. Echocardiogram with preserved LV systolic function.   Pending SNF placement, patient is high fall risk and not safe for home discharge.   2. New onset paroxysmal atrial fibrillation.. Patient with high thrombotic risk ChadsVasc score of 5, but high fall risk (maxillary sinus fracture), not candidate for anticoagulation.   Patient tolerating well AV blockade with long acting diltiazem.   3. Dehydration with hypernatremia. Stable renal function and electrolytes with serum cr at 0,85, K at 4,0 and serum bicarbonate at 25.  4. HTN. Blood pressure stable, tolerating well diltiazem.   5. Resolved PE. Follow up CT with resolved pulmonary embolism.  6. Depression and anxiety. Continue with diazepam and mirtazapine.   DVT prophylaxis: enoxaparin   Code Status:  full Family Communication: no family at the bedside  Disposition Plan/ discharge barriers: pending placement at SNF.     Subjective: Patient is feeling well, no complains of chest pain or dyspnea, no nausea or vomiting, continue to be very weak and deconditioned.   Objective: Vitals:   04/26/19 1414 04/26/19 1912 04/27/19 0829 04/27/19 0844  BP: 133/77 126/67 132/82 132/82  Pulse: 76 78 97 97  Resp: 18 18 16 16   Temp: 98.2 F (36.8 C) 98 F (36.7 C) 98.5 F (36.9 C) 98.5 F (36.9 C)  TempSrc: Oral Oral  Oral  SpO2: 98% 97% 98%   Weight:    99.8 kg  Height:    5\' 10"  (1.778 m)  Intake/Output Summary (Last 24 hours) at 04/27/2019 1029 Last data filed at 04/27/2019 0842 Gross per 24 hour  Intake 720 ml  Output 900 ml  Net -180 ml   Filed Weights   04/27/19 0844  Weight: 99.8 kg    Examination:   General: Not in pain or dyspnea. Deconditioned  Neurology: Awake and alert, non focal  E ENT: mild pallor, no icterus, oral  mucosa moist Cardiovascular: No JVD. S1-S2 present, rhythmic, no gallops, rubs, or murmurs. No lower extremity edema. Pulmonary: positive breath sounds bilaterally, adequate air movement, no wheezing, rhonchi or rales. Gastrointestinal. Abdomen with no organomegaly, non tender, no rebound or guarding Skin. No rashes Musculoskeletal: no joint deformities     Data Reviewed: I have personally reviewed following labs and imaging studies  CBC: Recent Labs  Lab 04/24/19 1550 04/24/19 1721 04/25/19 0324 04/26/19 0310  WBC 12.8*  --  9.9 8.7  NEUTROABS 10.6*  --   --   --   HGB 17.0 17.0 15.1 13.7  HCT 51.7 50.0 45.9 41.6  MCV 102.0*  --  101.3* 102.7*  PLT 224  --  185 123456*   Basic Metabolic Panel: Recent Labs  Lab 04/24/19 1550 04/24/19 1721 04/25/19 0324 04/26/19 0310 04/27/19 0721  NA 144 144 146* 144 141  K 4.1 4.2 3.6 3.4* 4.0  CL 109 111 111 112* 105  CO2 22  --  23 23 25   GLUCOSE 162* 150* 143* 120* 132*  BUN 33* 38* 29* 24* 15  CREATININE 1.04 0.80 0.86 0.90 0.85  CALCIUM 8.6*  --  8.1* 7.7* 8.1*   GFR: Estimated Creatinine Clearance: 76.6 mL/min (by C-G formula based on SCr of 0.85 mg/dL). Liver Function Tests: Recent Labs  Lab 04/24/19 1550 04/26/19 0310  AST 93* 68*  ALT 62* 50*  ALKPHOS 71 45  BILITOT 1.6* 1.1  PROT 7.0 5.2*  ALBUMIN 3.6 2.5*   No results for input(s): LIPASE, AMYLASE in the last 168 hours. No results for input(s): AMMONIA in the last 168 hours. Coagulation Profile: Recent Labs  Lab 04/24/19 1550  INR 1.1   Cardiac Enzymes: Recent Labs  Lab 04/24/19 1550 04/25/19 0324 04/26/19 0310  CKTOTAL 2,166* 2,295* 1,135*   BNP (last 3 results) No results for input(s): PROBNP in the last 8760 hours. HbA1C: No results for input(s): HGBA1C in the last 72 hours. CBG: Recent Labs  Lab 04/25/19 0633 04/27/19 0654  GLUCAP 128* 94   Lipid Profile: No results for input(s): CHOL, HDL, LDLCALC, TRIG, CHOLHDL, LDLDIRECT in the last  72 hours. Thyroid Function Tests: Recent Labs    04/25/19 0923  TSH 1.308   Anemia Panel: No results for input(s): VITAMINB12, FOLATE, FERRITIN, TIBC, IRON, RETICCTPCT in the last 72 hours.    Radiology Studies: I have reviewed all of the imaging during this hospital visit personally     Scheduled Meds: . vitamin C  1,000 mg Oral Daily  . cholecalciferol  1,000 Units Oral Daily  . diazepam  2 mg Oral BID  . diltiazem  120 mg Oral Daily  . latanoprost  1 drop Both Eyes QHS  . mirtazapine  30 mg Oral QHS  . sodium chloride flush  3 mL Intravenous Q12H  . vitamin B-12  1,000 mcg Oral Daily  . vitamin E  400 Units Oral Daily   Continuous Infusions:   LOS: 2 days        Kaysia Willard Gerome Apley, MD

## 2019-04-27 NOTE — Plan of Care (Signed)
  Problem: Skin Integrity: Goal: Risk for impaired skin integrity will decrease Outcome: Progressing   Problem: Safety: Goal: Ability to remain free from injury will improve Outcome: Progressing   Problem: Elimination: Goal: Will not experience complications related to bowel motility Outcome: Progressing   Problem: Coping: Goal: Level of anxiety will decrease Outcome: Progressing   Problem: Clinical Measurements: Goal: Ability to maintain clinical measurements within normal limits will improve Outcome: Progressing   Problem: Clinical Measurements: Goal: Will remain free from infection Outcome: Progressing   Problem: Education: Goal: Knowledge of General Education information will improve Description: Including pain rating scale, medication(s)/side effects and non-pharmacologic comfort measures Outcome: Progressing

## 2019-04-27 NOTE — TOC Progression Note (Signed)
Transition of Care [facility]) - Progression Note    Patient Details  Name: [name] MRN: [id number] Date of Birth: 1933-01-28  Transition of Care [facility]) CM/SW Contact  Jacalyn Lefevre Edson Snowball, RN Phone Number: 04/27/2019, 10:47 AM  Clinical Narrative:     Spoke to patient's son Synetta Shadow via phone. He is on his way to [place] right now. He is agreeable to SNF, but would like to speak with his father first, and chose a SNF. Bed offers left at patient's bedside. Synetta Shadow will call NCM back at noon.   Asked MD for new covid test.  Expected Discharge Plan: [place] Barriers to Discharge: Continued Medical Work up  Expected Discharge Plan and Services Expected Discharge Plan: [place] In-house Referral: Clinical Social Work Discharge Planning Services: CM Consult Post Acute Care Choice: (TBD; SNF vs HH) Living arrangements for the past 2 months: Single Family Home                                       Social Determinants of Health (SDOH) Interventions    Readmission Risk Interventions No flowsheet data found.

## 2019-04-27 NOTE — Plan of Care (Signed)
  Problem: Activity: Goal: Risk for activity intolerance will decrease Outcome: Progressing   Problem: Nutrition: Goal: Adequate nutrition will be maintained Outcome: Progressing   Problem: Education: Goal: Knowledge of General Education information will improve Description: Including pain rating scale, medication(s)/side effects and non-pharmacologic comfort measures Outcome: Not Progressing   Problem: Health Behavior/Discharge Planning: Goal: Ability to manage health-related needs will improve Outcome: Not Progressing

## 2019-04-28 LAB — GLUCOSE, CAPILLARY: Glucose-Capillary: 125 mg/dL — ABNORMAL HIGH (ref 70–99)

## 2019-04-28 MED ORDER — DIAZEPAM 2 MG PO TABS: 1.0000 mg | ORAL_TABLET | Freq: Two times a day (BID) | ORAL | 0 refills | Status: AC | PRN

## 2019-04-28 MED ORDER — DILTIAZEM HCL ER COATED BEADS 120 MG PO CP24: 120.0000 mg | ORAL_CAPSULE | Freq: Every day | ORAL | 0 refills | Status: AC

## 2019-04-28 MED ORDER — DIAZEPAM 2 MG PO TABS: 1.0000 mg | ORAL_TABLET | Freq: Two times a day (BID) | ORAL | Status: DC | PRN

## 2019-04-28 NOTE — TOC Progression Note (Addendum)
Transition of Care [facility]) - Progression Note    Patient Details  Name: [name] MRN: [id number] Date of Birth: 02-Feb-1933  Transition of Care [facility]) CM/SW Contact  Jacalyn Lefevre Edson Snowball, RN Phone Number: 04/28/2019, 11:08 AM  Clinical Narrative:     Patient and son have chose [facility]. Called Janie at [facility]. Once NCM confirms she is still able to accept will update Insurance. Insurance Josem Kaufmann is still pending   Janie at [facility] can accept patient today and would like Synetta Shadow to be at [facility] if possible at 1 pm to do paperwork.   Update Kerri with [employer]. Received insurance authorization number [id number].    Synetta Shadow will be at [facility] between 1 and 1:30 pm to do paperwork. Janie aware . Patient will be going to Rm [id number] , nurse to call report to [phone]  PTAR arranged for 1:30  Expected Discharge Plan: [place] Barriers to Discharge: Continued Medical Work up  Expected Discharge Plan and Services Expected Discharge Plan: [place] In-house Referral: Clinical Social Work Discharge Planning Services: CM Consult Post Acute Care Choice: (TBD; SNF vs HH) Living arrangements for the past 2 months: Single Family Home Expected Discharge Date: 04/28/19                                     Social Determinants of Health (SDOH) Interventions    Readmission Risk Interventions No flowsheet data found.

## 2019-04-28 NOTE — Progress Notes (Signed)
Occupational Therapy Treatment Patient Details Name: [name] MRN: [id number] DOB: [birth year] Today's Date: 04/28/2019    History of present illness 86 yo admitted after found down at home with maxillary fx. PMHx: gait disorder, PE, HTN, glaucoma, back sx, Rt THA, vertigo, skin CA with radical neck dissection   OT comments  Pt seen for skilled co-tx with PT this session. Pt initially needing max - total A for balance at EOB with significant posterior bias. Pt standing with max A +2 from elevated bed with RW and having BM. Pt then standing into stedy with max A +2 and assisted into bathroom for toileting needs. Pt needing total A for hygiene, clothing management, and max A of 2  sit <>stand within stedy. Pt fatigues quickly and able to stand for ~ 1 minutes for hygiene to occur. Pt would continue to benefit from OT intervention to address functional deficits.   Follow Up Recommendations  SNF;Supervision/Assistance - 24 hour    Equipment Recommendations  Other (comment)(defer to next venue of care)    Recommendations for Other Services      Precautions / Restrictions Precautions Precautions: Fall Restrictions Weight Bearing Restrictions: No       Mobility Bed Mobility Overal bed mobility: Needs Assistance Bed Mobility: Supine to Sit     Supine to sit: HOB elevated;Max assist;+2 for safety/equipment Sit to supine: Max assist   General bed mobility comments: max assist to move legs toward EOB and elevate trunk with HOB 20 degrees, increased time and cues for sequence  Transfers Overall transfer level: Needs assistance Equipment used: Rolling walker (2 wheeled) Transfers: Sit to/from Stand Sit to Stand: Max assist;+2 physical assistance;From elevated surface         General transfer comment: pt stood from bed with RW with 2 person assist. Pt incontinent of liquid stool in standing and returned to bed. Then stood from bed to stedy with transfer to toilet via stedy. 2  additional stands from [facility] over toilet. Transfer from toilet to chair via stedy. Mod +2 to rise with use of stedy    Balance Overall balance assessment: Needs assistance Sitting-balance support: Feet supported;Bilateral upper extremity supported Sitting balance-Leahy Scale: Poor Sitting balance - Comments: mod assist with initial posterior right lean able to maintain seated with use of LUE on rail but with progressive lag back to right posterior lean in sitting at EOB, toilet and chair         ADL either performed or assessed with clinical judgement   ADL Overall ADL's : Needs assistance/impaired          Toilet Transfer: +2 for safety/equipment;+2 for physical assistance;Total assistance Toilet Transfer Details (indicate cue type and reason): stedy Toileting- Clothing Manipulation and Hygiene: Total assistance;+2 for physical assistance;+2 for safety/equipment;Sit to/from stand Toileting - Clothing Manipulation Details (indicate cue type and reason): sit <>stand from stedy and total A for hygiene             Vision Baseline Vision/History: Wears glasses Wears Glasses: Reading only Patient Visual Report: No change from baseline            Cognition Arousal/Alertness: Awake/alert Behavior During Therapy: WFL for tasks assessed/performed Overall Cognitive Status: Impaired/Different from baseline Area of Impairment: Attention;Memory;Following commands;Safety/judgement;Problem solving         Orientation Level: Disoriented to;Time;Situation Current Attention Level: Sustained Memory: Decreased short-term memory Following Commands: Follows one step commands with increased time Safety/Judgement: Decreased awareness of deficits;Decreased awareness of safety   Problem Solving: Slow  processing;Decreased initiation;Difficulty sequencing;Requires verbal cues;Requires tactile cues General Comments: Pt oriented to time with delayed processing and needing cues and assist to don  watch                   Pertinent Vitals/ Pain       Pain Assessment: Faces Pain Score: 4  Faces Pain Scale: Hurts even more Pain Location: RUE Pain Descriptors / Indicators: Aching Pain Intervention(s): Limited activity within patient's tolerance;Monitored during session         Frequency  Min 2X/week        Progress Toward Goals  OT Goals(current goals can now be found in the care plan section)  Progress towards OT goals: Progressing toward goals  Acute Rehab OT Goals Patient Stated Goal: return home OT Goal Formulation: With patient/family Time For Goal Achievement: 05/12/19  Plan Discharge plan remains appropriate    Co-evaluation    PT/OT/SLP Co-Evaluation/Treatment: Yes Reason for Co-Treatment: Complexity of the patient's impairments (multi-system involvement);For patient/therapist safety;To address functional/ADL transfers PT goals addressed during session: Mobility/safety with mobility;Balance;Proper use of DME OT goals addressed during session: ADL's and self-care;Proper use of Adaptive equipment and DME      AM-PAC OT "6 Clicks" Daily Activity     Outcome Measure   Help from another person eating meals?: A Little Help from another person taking care of personal grooming?: A Lot Help from another person toileting, which includes using toliet, bedpan, or urinal?: Total Help from another person bathing (including washing, rinsing, drying)?: Total Help from another person to put on and taking off regular upper body clothing?: A Lot Help from another person to put on and taking off regular lower body clothing?: Total 6 Click Score: 10    End of Session Equipment Utilized During Treatment: Rolling walker(stedy)  OT Visit Diagnosis: Other abnormalities of gait and mobility (R26.89);Muscle weakness (generalized) (M62.81);History of falling (Z91.81);Other symptoms and signs involving cognitive function   Activity Tolerance Patient limited by fatigue    Patient Left in bed;with call bell/phone within reach;with bed alarm set;with family/visitor present   Nurse Communication Mobility status;Other (comment)(skin tears R hand and R shoulder)        Time: [id number] OT Time Calculation (min): 35 min  Charges: OT General Charges $OT Visit: 1 Visit OT Treatments $Self Care/Home Management : 8-22 mins  Darleen Crocker P 04/28/2019, 1:10 PM

## 2019-04-28 NOTE — Plan of Care (Signed)
  Problem: Education: Goal: Knowledge of General Education information will improve Description Including pain rating scale, medication(s)/side effects and non-pharmacologic comfort measures Outcome: Progressing   

## 2019-04-28 NOTE — Progress Notes (Signed)
Discharge paperwork and instructions in folder for PTAR to give to receiving facility. Called and gave report to receiving facility. Pt not in distress and tolerated well.

## 2019-04-28 NOTE — Progress Notes (Signed)
Physical Therapy Treatment Patient Details Name: [name] MRN: [id number] DOB: 1932/10/07 Today's Date: 04/28/2019    History of Present Illness 86 yo admitted after found down at home with maxillary fx. PMHx: gait disorder, PE, HTN, glaucoma, back sx, Rt THA, vertigo, skin CA with radical neck dissection    PT Comments    Pt supine on arrival with slow responses and struggling to don watch. Pt with ability to stand with +2 assist today with RW present but limited by clear liquid stool in standing. Pt progressed well with repeated standing trials with use of Stedy and +2 assist with maintained mod assist to maintain standing in stedy for pericare. Pt transferred via stedy from bed to toilet and recliner. Pt with continued posterior right lean and decreased cognition and ability to care for himself. Will continue to follow and D/C plan remains appropriate.    Follow Up Recommendations  SNF;Supervision/Assistance - 24 hour     Equipment Recommendations  Wheelchair (measurements PT);3in1 (PT)    Recommendations for Other Services       Precautions / Restrictions Precautions Precautions: Fall    Mobility  Bed Mobility Overal bed mobility: Needs Assistance Bed Mobility: Supine to Sit     Supine to sit: HOB elevated;Max assist;+2 for safety/equipment     General bed mobility comments: max assist to move legs toward EOB and elevate trunk with HOB 20 degrees, increased time and cues for sequence  Transfers Overall transfer level: Needs assistance Equipment used: Rolling walker (2 wheeled) Transfers: Sit to/from Stand Sit to Stand: Max assist;+2 physical assistance;From elevated surface         General transfer comment: pt stood from bed with RW with 2 person assist. Pt incontinent of liquid stool in standing and returned to bed. Then stood from bed to stedy with transfer to toilet via stedy. 2 additional stands from [facility] over toilet. Transfer from toilet to chair via stedy.  Mod +2 to rise with use of stedy  Ambulation/Gait             General Gait Details: not yet able   Stairs             Wheelchair Mobility    Modified Rankin (Stroke Patients Only)       Balance Overall balance assessment: Needs assistance Sitting-balance support: Feet supported;Bilateral upper extremity supported Sitting balance-Leahy Scale: Poor Sitting balance - Comments: mod assist with initial posterior right lean able to maintain seated with use of LUE on rail but with progressive lag back to right posterior lean in sitting at EOB, toilet and chair                                    Cognition Arousal/Alertness: Awake/alert Behavior During Therapy: WFL for tasks assessed/performed Overall Cognitive Status: Impaired/Different from baseline Area of Impairment: Attention;Memory;Following commands;Safety/judgement;Problem solving                   Current Attention Level: Sustained Memory: Decreased short-term memory Following Commands: Follows one step commands with increased time Safety/Judgement: Decreased awareness of deficits;Decreased awareness of safety   Problem Solving: Slow processing;Decreased initiation;Difficulty sequencing;Requires verbal cues;Requires tactile cues General Comments: Pt oriented to time with delayed processing and needing cues and assist to don watch      Exercises      General Comments        Pertinent Vitals/Pain Pain Score: 4  Pain Location: RUE Pain Descriptors / Indicators: Aching Pain Intervention(s): Limited activity within patient's tolerance;Repositioned;Monitored during session    Home Living                      Prior Function            PT Goals (current goals can now be found in the care plan section) Progress towards PT goals: Progressing toward goals    Frequency    Min 2X/week      PT Plan Current plan remains appropriate    Co-evaluation PT/OT/SLP  Co-Evaluation/Treatment: Yes Reason for Co-Treatment: Complexity of the patient's impairments (multi-system involvement);For patient/therapist safety PT goals addressed during session: Mobility/safety with mobility;Balance;Proper use of DME        AM-PAC PT "6 Clicks" Mobility   Outcome Measure  Help needed turning from your back to your side while in a flat bed without using bedrails?: Total Help needed moving from lying on your back to sitting on the side of a flat bed without using bedrails?: Total Help needed moving to and from a bed to a chair (including a wheelchair)?: Total Help needed standing up from a chair using your arms (e.g., wheelchair or bedside chair)?: Total Help needed to walk in hospital room?: Total Help needed climbing 3-5 steps with a railing? : Total 6 Click Score: 6    End of Session Equipment Utilized During Treatment: Gait belt Activity Tolerance: Patient tolerated treatment well Patient left: in chair;with call bell/phone within reach;with chair alarm set Nurse Communication: Mobility status;Need for lift equipment PT Visit Diagnosis: Difficulty in walking, not elsewhere classified (R26.2);Other abnormalities of gait and mobility (R26.89);Muscle weakness (generalized) (M62.81);History of falling (Z91.81)     Time: [id number] PT Time Calculation (min) (ACUTE ONLY): 38 min  Charges:  $Therapeutic Activity: 23-37 mins                     Bentlee Benningfield P, PT Acute Rehabilitation Services Pager: [phone] Office: [phone]    Tashiana Lamarca B Suhaila Troiano 04/28/2019, 12:21 PM

## 2019-04-28 NOTE — Discharge Summary (Signed)
Physician Discharge Summary  [name] [id number] DOB: [birth year] DOA: 04/24/2019  PCP: Gregory Downing, MD  Admit date: 04/24/2019 Discharge date: 04/28/2019  Admitted From: home  Disposition: SNF.   Recommendations for Outpatient Follow-up and new medication changes:  1. Follow up with [name] in 7 days.  2. Patient has been placed on diltiazem for atrial fibrillation rate control 3. Antiplatelet therapy with aspirin, not candidate to full anticoagulation due to high fall risk.  4. Holding on losartan. 5. Diazepam changed to 1 mg from 1 mg and to use as needed for anxiety.    Home Health: na   Equipment/Devices: na    Discharge Condition: stable  CODE STATUS: full  Diet recommendation: heart healthy   Brief/Interim Summary: Patient was admitted to the hospital with a working diagnosis of syncope complicated by right maxillary sinus fracture.During hospitalization found to have atrial fibrillation.  86 year old male who presented after a fall at home. He does have significant past medical history for pulmonary embolism in 2012, hypertension and gait disturbance. Patient was found down by EMS, he had right-sided pain but unable to recall details.Apparently for the last 4 days he was unable to be reached by his neighbors.On his initial physical examination his blood pressure was 157/85, heart rate 103, respiratory rate 20, temperature 98.5, oxygen saturation 91%. He had dry mucous membranes, his lungs were clear to auscultation bilaterally, heart S1-S2 present rhythmic, soft abdomen, no lower extremity edema. Sodium 144, potassium 4.1, chloride 109, bicarb 22, glucose 162, BUN 33, creatinine 1.0, CPK 2966, white count 12.8, hemoglobin 17.0, hematocrit 51.7, platelets 224.  SARS COVID-19 was negative.  Urine analysis specific gravity 1.029, negative for infection.  Chest radiograph with hyperinflation, bibasilar atelectasis.  X-rays of right elbow, right wrist and  pelvis negative for fractures.  Head CT no acute changes, positive nondisplaced fractures of the facial bones, suspected nondisplaced fracture of the lateral and posterior walls of the right maxillary sinus.EKG 91 bpm, left axis deviation, right bundle branch block, sinus rhythm, no ST segment or T wave changes.  Patient underwent further work-up with CT chest which was negative for pulmonary embolism, echocardiography with preserved LV systolic function. 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   During hospitalization patient was found to have atrial fibrillation, which was controlled with AV blockade. Not candidate for anticoagulation due to fall risk.    1.  Syncope complicated with acute right maxillary sinus fracture.  Patient was admitted to the telemetry ward, he received intravenous fluids, further work-up with CT chest which rule out for pulmonary embolism, echocardiogram with preserved LV systolic function.  Patient was seen by physical therapy/Occupational Therapy, with recommendations for continued care at a skilled nursing facility.  The dose of diazepam has been decreased to 1 mg and changed to only as needed.  2.  New onset paroxysmal atrial fibrillation.  Patient was placed on AV blockade with diltiazem with good response.  He does have a high thromboembolic risk, calculated CHA2DS2-VASc score 5.  He does have a high fall risk, reflected by his acute right maxillary sinus fracture and elevated CK on admission.  Patient will continue antiplatelet therapy with aspirin.  Elevated troponin due to atrial fibrillation, ruled out for acute coronary syndrome.   3.  Dehydration with hyponatremia.  Patient received intravenous fluids with correction of electrolytes.  At discharge tolerating p.o. diet adequately.   Peak CK at 2,295 not in  range for rhabdomyolysis.   4.  Hypertension.  Losartan has  been held and patient has been placed on diltiazem with good response.  5.  History of pulmonary embolism.  Repeat CT chest was negative for pulmonary embolism.  6.  Depression/anxiety.  Continue mirtazapine, diazepam has been decreased to 1 mg to use only as needed twice daily.  Discharge Diagnoses:  Principal Problem:   Syncope Active Problems:   Gait abnormality   Fall   Essential hypertension   History of pulmonary embolism   Right maxillary fracture (HCC)   Elevated CK   Atrial fibrillation (HCC)   Abnormal MRI of head   Hyperglycemia   Elevated LFTs   Hypernatremia   New onset a-fib [name])    Discharge Instructions   Allergies as of 04/28/2019      Reactions   Codeine Nausea And Vomiting   Morphine And Related Nausea And Vomiting      Medication List    STOP taking these medications   ibuprofen 200 MG tablet Commonly known as: ADVIL   losartan 100 MG tablet Commonly known as: COZAAR     TAKE these medications   aspirin EC 81 MG tablet Take 81 mg by mouth daily.   cholecalciferol 25 MCG (1000 UNIT) tablet Commonly known as: VITAMIN D3 Take 1,000 Units by mouth daily.   Copper Caps 2 MG Caps Generic drug: Copper Gluconate Take 2 mg by mouth daily.   diazepam 2 MG tablet Commonly known as: VALIUM Take 0.5 tablets (1 mg total) by mouth 2 (two) times daily as needed for anxiety. What changed:   how much to take  when to take this  reasons to take this   diltiazem 120 MG 24 hr capsule Commonly known as: CARDIZEM CD Take 1 capsule (120 mg total) by mouth daily.   diphenhydrAMINE 25 MG tablet Commonly known as: BENADRYL Take 25 mg by mouth daily as needed (runny nose).   GLUCOSAMINE-CHONDROITIN PO Take 1 tablet by mouth daily.   latanoprost 0.005 % ophthalmic solution Commonly known as: XALATAN Place 1 drop into both eyes at bedtime.   mirtazapine 30 MG tablet Commonly known as: REMERON Take 30 mg by mouth at bedtime.   Red Yeast Rice  600 MG Caps Take 1,200 mg by mouth daily.   vitamin B-12 1000 MCG tablet Commonly known as: CYANOCOBALAMIN Take 1,000 mcg by mouth daily.   vitamin C 1000 MG tablet Take 1,000 mg by mouth daily.   vitamin E 180 MG (400 UNITS) capsule Take 400 Units by mouth daily.            Durable Medical Equipment  (From admission, onward)         Start     Ordered   04/25/19 1320  For home use only DME 3 n 1  Once     04/25/19 1319         Follow-up Information    Gregory Downing, MD Follow up in 1 week(s).   Specialty: Family Medicine Contact information: [place] [address] [phone]          Allergies  Allergen Reactions  . Codeine Nausea And Vomiting  . Morphine And Related Nausea And Vomiting    Consultations:  Cardiology    Procedures/Studies: DG Chest 1 View  Result Date: 04/24/2019 CLINICAL DATA:  86 year old male found on the floor. EXAM: CHEST  1 VIEW COMPARISON:  Chest radiograph dated 07/31/2010. FINDINGS: There is mild eventration of  the right hemidiaphragm. No focal consolidation, pleural effusion, or pneumothorax. Minimal left lung base atelectasis. The cardiac silhouette is within normal limits. Atherosclerotic calcification of the aorta. No acute osseous pathology. IMPRESSION: No active disease. Electronically Signed   By: Anner Crete M.D.   On: 04/24/2019 17:26   DG Pelvis 1-2 Views  Result Date: 04/24/2019 CLINICAL DATA:  Unwitnessed fall. Found on floor. EXAM: PELVIS - 1-2 VIEW COMPARISON:  None. FINDINGS: Right hip arthroplasty in expected alignment. No periprosthetic lucency or fracture. Bony pelvis is intact. No acute pelvic fracture. Postsurgical change in the lower lumbar spine is partially included. Mild left hip osteoarthritis. IMPRESSION: No pelvic fracture. Right hip arthroplasty in expected alignment without complication. Electronically Signed   By: Keith Rake M.D.   On: 04/24/2019 17:28   DG  Elbow Complete Right  Result Date: 04/24/2019 CLINICAL DATA:  Unwitnessed fall. Found on the floor. EXAM: RIGHT ELBOW - COMPLETE 3+ VIEW COMPARISON:  None. FINDINGS: There is no evidence of fracture, dislocation, or joint effusion. Tiny olecranon spur. There is no evidence of arthropathy or other focal bone abnormality. Mild soft tissue edema. IMPRESSION: Mild soft tissue edema without osseous abnormality. Electronically Signed   By: Keith Rake M.D.   On: 04/24/2019 17:27   DG Wrist Complete Right  Result Date: 04/24/2019 CLINICAL DATA:  Unwitnessed fall. Found on floor. EXAM: RIGHT WRIST - COMPLETE 3+ VIEW COMPARISON:  None. FINDINGS: There is no evidence of fracture or dislocation. There is no evidence of arthropathy or other focal bone abnormality. Generalized soft tissue edema. IMPRESSION: Soft tissue edema without acute osseous abnormality. Electronically Signed   By: Keith Rake M.D.   On: 04/24/2019 17:29   CT Head Wo Contrast  Result Date: 04/24/2019 CLINICAL DATA:  Facial trauma, altered mental status, uncertain cause EXAM: CT HEAD WITHOUT CONTRAST CT MAXILLOFACIAL WITHOUT CONTRAST CT CERVICAL SPINE WITHOUT CONTRAST TECHNIQUE: Multidetector CT imaging of the head, cervical spine, and maxillofacial structures were performed using the standard protocol without intravenous contrast. Multiplanar CT image reconstructions of the cervical spine and maxillofacial structures were also generated. COMPARISON:  MR brain, 06/01/2018 FINDINGS: CT HEAD FINDINGS Brain: No evidence of acute infarction, hemorrhage, hydrocephalus, extra-axial collection or mass lesion/mass effect. Extensive periventricular and deep white matter hypodensity, global volume loss, and enlargement of the lateral ventricles, likely ex vacuo. Vascular: No hyperdense vessel or unexpected calcification. CT FACIAL BONES FINDINGS Skull: Normal. Negative for fracture or focal lesion. Facial bones: Suspect nondisplaced fracture of the  lateral and posterior walls of the right maxillary sinus (series 10, image 46). Sinuses/Orbits: No acute finding. Mucosal thickening in the maxillary sinuses and ethmoid air cells. No air-fluid levels. Other: Soft tissue contusion overlying the right forehead, right scalp, and right cheek. CT CERVICAL SPINE FINDINGS Alignment: Normal. Skull base and vertebrae: No acute fracture. No primary bone lesion or focal pathologic process. Soft tissues and spinal canal: No prevertebral fluid or swelling. No visible canal hematoma. Disc levels: Mild to moderate multilevel disc space height loss and osteophytosis. Upper chest: Negative. Other: Postoperative findings of right cervical dissection IMPRESSION: 1.  No acute intracranial pathology. 2. Extensive small-vessel white matter disease and global severe volume loss. 3. Enlarged lateral ventricles, generally unchanged in appearance and configuration compared to prior examination, likely ex vacuo. 4. No displaced fractures of the facial bones. Suspect nondisplaced fracture of the lateral and posterior walls of the right maxillary sinus (series 10, image 46). 5. Soft tissue overlying the right forehead, right scalp, and right  cheek. 6.  No fracture or static subluxation of the cervical spine. 7.  Postoperative findings of right cervical dissection. Electronically Signed   By: Eddie Candle M.D.   On: 04/24/2019 18:07   CT ANGIO CHEST PE W OR WO CONTRAST  Result Date: 04/25/2019 CLINICAL DATA:  Syncope. EXAM: CT ANGIOGRAPHY CHEST WITH CONTRAST TECHNIQUE: Multidetector CT imaging of the chest was performed using the standard protocol during bolus administration of intravenous contrast. Multiplanar CT image reconstructions and MIPs were obtained to evaluate the vascular anatomy. CONTRAST:  152mL OMNIPAQUE IOHEXOL 350 MG/ML SOLN COMPARISON:  Jul 25, 2010. FINDINGS: Cardiovascular: Satisfactory opacification of the pulmonary arteries to the segmental level. No evidence of  pulmonary embolism. Normal heart size. No pericardial effusion. Atherosclerosis of thoracic aorta is noted without aneurysm formation. Coronary artery calcifications are noted. Mediastinum/Nodes: No enlarged mediastinal, hilar, or axillary lymph nodes. Thyroid gland, trachea, and esophagus demonstrate no significant findings. Lungs/Pleura: No pneumothorax pleural effusion is noted. Minimal subsegmental atelectasis is noted in the right lower lobe. Upper Abdomen: No acute abnormality. Musculoskeletal: No chest wall abnormality. No acute or significant osseous findings. Review of the MIP images confirms the above findings. IMPRESSION: 1. No definite evidence of pulmonary embolus. 2. Coronary artery calcifications are noted suggesting coronary artery disease. 3. Minimal subsegmental atelectasis is noted in the right lower lobe. Aortic Atherosclerosis (ICD10-I70.0). Electronically Signed   By: Marijo Conception M.D.   On: 04/25/2019 12:40   CT Cervical Spine Wo Contrast  Result Date: 04/24/2019 CLINICAL DATA:  Facial trauma, altered mental status, uncertain cause EXAM: CT HEAD WITHOUT CONTRAST CT MAXILLOFACIAL WITHOUT CONTRAST CT CERVICAL SPINE WITHOUT CONTRAST TECHNIQUE: Multidetector CT imaging of the head, cervical spine, and maxillofacial structures were performed using the standard protocol without intravenous contrast. Multiplanar CT image reconstructions of the cervical spine and maxillofacial structures were also generated. COMPARISON:  MR brain, 06/01/2018 FINDINGS: CT HEAD FINDINGS Brain: No evidence of acute infarction, hemorrhage, hydrocephalus, extra-axial collection or mass lesion/mass effect. Extensive periventricular and deep white matter hypodensity, global volume loss, and enlargement of the lateral ventricles, likely ex vacuo. Vascular: No hyperdense vessel or unexpected calcification. CT FACIAL BONES FINDINGS Skull: Normal. Negative for fracture or focal lesion. Facial bones: Suspect nondisplaced  fracture of the lateral and posterior walls of the right maxillary sinus (series 10, image 46). Sinuses/Orbits: No acute finding. Mucosal thickening in the maxillary sinuses and ethmoid air cells. No air-fluid levels. Other: Soft tissue contusion overlying the right forehead, right scalp, and right cheek. CT CERVICAL SPINE FINDINGS Alignment: Normal. Skull base and vertebrae: No acute fracture. No primary bone lesion or focal pathologic process. Soft tissues and spinal canal: No prevertebral fluid or swelling. No visible canal hematoma. Disc levels: Mild to moderate multilevel disc space height loss and osteophytosis. Upper chest: Negative. Other: Postoperative findings of right cervical dissection IMPRESSION: 1.  No acute intracranial pathology. 2. Extensive small-vessel white matter disease and global severe volume loss. 3. Enlarged lateral ventricles, generally unchanged in appearance and configuration compared to prior examination, likely ex vacuo. 4. No displaced fractures of the facial bones. Suspect nondisplaced fracture of the lateral and posterior walls of the right maxillary sinus (series 10, image 46). 5. Soft tissue overlying the right forehead, right scalp, and right cheek. 6.  No fracture or static subluxation of the cervical spine. 7.  Postoperative findings of right cervical dissection. Electronically Signed   By: Eddie Candle M.D.   On: 04/24/2019 18:07   MR BRAIN WO CONTRAST  Result Date: 04/24/2019 CLINICAL DATA:  Initial evaluation for acute encephalopathy, found down. EXAM: MRI HEAD WITHOUT CONTRAST TECHNIQUE: Multiplanar, multiecho pulse sequences of the brain and surrounding structures were obtained without intravenous contrast. COMPARISON:  Comparison made with prior CT from earlier same day. FINDINGS: Brain: Diffuse prominence of the CSF containing spaces compatible generalized age-related cerebral atrophy. Minimal T2/FLAIR hyperintensity within the periventricular white matter,  nonspecific, but most likely related chronic microvascular ischemic disease, felt to be within normal limits for age. Small remote lacunar infarcts present at the right caudate, right thalamus, and posterior left subcortical white matter. Associated small chronic microhemorrhage noted about the left-sided chronic lacunar infarct (series 14, image 35). No abnormal foci of restricted diffusion to suggest acute or subacute ischemia. Gray-white matter differentiation maintained. No encephalomalacia to suggest chronic cortical infarction. No other evidence for acute or chronic intracranial hemorrhage. No mass lesion, midline shift or mass effect. Diffuse ventricular prominence, not significantly changed as compared to previous, most likely related to global parenchymal volume loss. No evidence for transependymal flow of CSF. No extra-axial fluid collection. Pituitary gland suprasellar region normal. Midline structures intact. Vascular: Major intracranial vascular flow voids are maintained. Skull and upper cervical spine: Craniocervical junction within normal limits. Upper cervical spine unremarkable. Bone marrow signal intensity within normal limits. Soft tissue contusion/edema seen involving the right frontotemporal scalp. Sinuses/Orbits: Patient status post bilateral ocular lens replacement. Scattered mucosal thickening noted throughout the paranasal sinuses. Superimposed small retention cyst noted within the left maxillary sinus. No significant mastoid effusion. Inner ear structures grossly normal. Other: None. IMPRESSION: 1. No acute intracranial abnormality. 2. Soft tissue contusion involving the right frontotemporal scalp. 3. Age-related cerebral atrophy with small remote lacunar infarcts involving the right caudate, right thalamus, and subcortical left parietal lobe. 4. Diffuse ventricular prominence, similar to previous, and most likely related to global parenchymal volume loss. Electronically Signed   By:  Jeannine Boga M.D.   On: 04/24/2019 22:52   ECHOCARDIOGRAM COMPLETE  Result Date: 04/25/2019    ECHOCARDIOGRAM REPORT   Patient Name:   JEM EASTBURN Date of Exam: 04/25/2019 Medical Rec #:  [id number]    Height:       70.0 in Accession #:    [id number]   Weight:       220.0 lb Date of Birth:  [birth year]   BSA:          2.174 m Patient Age:    86 years     BP:           140/86 mmHg Patient Gender: M            HR:           107 bpm. Exam Location:  Inpatient Procedure: 2D Echo, Cardiac Doppler and Color Doppler Indications:    Syncope 780.2  History:        Patient has no prior history of Echocardiogram examinations.                 Signs/Symptoms:Dyspnea; Risk Factors:Hypertension and Former                 Smoker. Pulmonary embolism.  Sonographer:    Vickie Epley RDCS Referring Phys: [id number] VISHAL R PATEL  Sonographer Comments: Technically difficult study due to poor echo windows. IMPRESSIONS  1. Technically difficult echo with poor image quality.  2. Left ventricular ejection fraction, by estimation, is 50 to 55%. The left ventricle has low normal function. The left ventricle has no regional  wall motion abnormalities. Left ventricular diastolic parameters are consistent with Grade I diastolic dysfunction (impaired relaxation).  3. Right ventricular systolic function is normal. The right ventricular size is normal. There is normal pulmonary artery systolic pressure.  4. The mitral valve is normal in structure and function. No evidence of mitral valve regurgitation. No evidence of mitral stenosis.  5. The aortic valve is normal in structure and function. Aortic valve regurgitation is not visualized. No aortic stenosis is present. FINDINGS  Left Ventricle: Left ventricular ejection fraction, by estimation, is 50 to 55%. The left ventricle has low normal function. The left ventricle has no regional wall motion abnormalities. The left ventricular internal cavity size was normal in size. There is no left  ventricular hypertrophy. Left ventricular diastolic parameters are consistent with Grade I diastolic dysfunction (impaired relaxation). Right Ventricle: The right ventricular size is normal. No increase in right ventricular wall thickness. Right ventricular systolic function is normal. There is normal pulmonary artery systolic pressure. The tricuspid regurgitant velocity is 2.33 m/s, and  with an assumed right atrial pressure of 8 mmHg, the estimated right ventricular systolic pressure is [id number] mmHg. Left Atrium: Left atrial size was normal in size. Right Atrium: Right atrial size was normal in size. Pericardium: Trivial pericardial effusion is present. Mitral Valve: The mitral valve is normal in structure and function. No evidence of mitral valve regurgitation. No evidence of mitral valve stenosis. Tricuspid Valve: The tricuspid valve is grossly normal. Tricuspid valve regurgitation is trivial. Aortic Valve: The aortic valve is normal in structure and function. Aortic valve regurgitation is not visualized. No aortic stenosis is present. Pulmonic Valve: The pulmonic valve was not well visualized. Pulmonic valve regurgitation is not visualized. Aorta: The aortic root and ascending aorta are structurally normal, with no evidence of dilitation. IAS/Shunts: The atrial septum is grossly normal. Additional Comments: Technically difficult echo with poor image quality.  LEFT VENTRICLE PLAX 2D LVIDd:         4.60 cm LVIDs:         3.50 cm LV PW:         0.90 cm LV IVS:        0.90 cm LVOT diam:     1.90 cm LV SV:         40 LV SV Index:   18 LVOT Area:     2.84 cm  LV Volumes (MOD) LV vol d, MOD A4C: 72.5 ml LV vol s, MOD A4C: 35.2 ml LV SV MOD A4C:     72.5 ml RIGHT VENTRICLE TAPSE (M-mode): 1.8 cm LEFT ATRIUM           Index LA diam:      2.90 cm 1.33 cm/m LA Vol (A4C): 25.7 ml 11.82 ml/m  AORTIC VALVE LVOT Vmax:   85.50 cm/s LVOT Vmean:  57.400 cm/s LVOT VTI:    0.141 m  AORTA Ao Root diam: 3.80 cm Ao Asc diam:  3.40 cm  MITRAL VALVE               TRICUSPID VALVE MV Area (PHT): 3.42 cm    TR Peak grad:   21.7 mmHg MV Decel Time: 222 msec    TR Vmax:        233.00 cm/s MV E velocity: 50.60 cm/s MV A velocity: 70.30 cm/s  SHUNTS MV E/A ratio:  0.72        Systemic VTI:  0.14 m  Systemic Diam: 1.90 cm Mertie Moores MD Electronically signed by Mertie Moores MD Signature Date/Time: 04/25/2019/10:38:13 AM    Final    CT Maxillofacial WO CM  Result Date: 04/24/2019 CLINICAL DATA:  Facial trauma, altered mental status, uncertain cause EXAM: CT HEAD WITHOUT CONTRAST CT MAXILLOFACIAL WITHOUT CONTRAST CT CERVICAL SPINE WITHOUT CONTRAST TECHNIQUE: Multidetector CT imaging of the head, cervical spine, and maxillofacial structures were performed using the standard protocol without intravenous contrast. Multiplanar CT image reconstructions of the cervical spine and maxillofacial structures were also generated. COMPARISON:  MR brain, 06/01/2018 FINDINGS: CT HEAD FINDINGS Brain: No evidence of acute infarction, hemorrhage, hydrocephalus, extra-axial collection or mass lesion/mass effect. Extensive periventricular and deep white matter hypodensity, global volume loss, and enlargement of the lateral ventricles, likely ex vacuo. Vascular: No hyperdense vessel or unexpected calcification. CT FACIAL BONES FINDINGS Skull: Normal. Negative for fracture or focal lesion. Facial bones: Suspect nondisplaced fracture of the lateral and posterior walls of the right maxillary sinus (series 10, image 46). Sinuses/Orbits: No acute finding. Mucosal thickening in the maxillary sinuses and ethmoid air cells. No air-fluid levels. Other: Soft tissue contusion overlying the right forehead, right scalp, and right cheek. CT CERVICAL SPINE FINDINGS Alignment: Normal. Skull base and vertebrae: No acute fracture. No primary bone lesion or focal pathologic process. Soft tissues and spinal canal: No prevertebral fluid or swelling. No visible canal  hematoma. Disc levels: Mild to moderate multilevel disc space height loss and osteophytosis. Upper chest: Negative. Other: Postoperative findings of right cervical dissection IMPRESSION: 1.  No acute intracranial pathology. 2. Extensive small-vessel white matter disease and global severe volume loss. 3. Enlarged lateral ventricles, generally unchanged in appearance and configuration compared to prior examination, likely ex vacuo. 4. No displaced fractures of the facial bones. Suspect nondisplaced fracture of the lateral and posterior walls of the right maxillary sinus (series 10, image 46). 5. Soft tissue overlying the right forehead, right scalp, and right cheek. 6.  No fracture or static subluxation of the cervical spine. 7.  Postoperative findings of right cervical dissection. Electronically Signed   By: Eddie Candle M.D.   On: 04/24/2019 18:07       Subjective: Patient is feeling better, no nausea or vomiting, no chest pain, continue to have generalized weakness, no dyspnea.   Discharge Exam: Vitals:   04/28/19 0445 04/28/19 0716  BP: 125/84 (!) 143/74  Pulse: 88 84  Resp: 16 15  Temp: 98 F (36.7 C) 98.6 F (37 C)  SpO2: 92% 95%   Vitals:   04/27/19 1905 04/28/19 0445 04/28/19 0500 04/28/19 0716  BP: 135/71 125/84  (!) 143/74  Pulse: 86 88  84  Resp: 17 16  15   Temp: 98.2 F (36.8 C) 98 F (36.7 C)  98.6 F (37 C)  TempSrc: Oral Axillary  Oral  SpO2: 93% 92%  95%  Weight:   99.2 kg   Height:        General: Not in pain or dyspnea. Deconditioned  Neurology: Awake and alert, non focal  E ENT: no pallor, no icterus, oral mucosa moist Cardiovascular: No JVD. S1-S2 present, rhythmic, no gallops, rubs, or murmurs. No lower extremity edema. Mild non pitting edema on the right upper extremity.  Pulmonary: vesicular breath sounds bilaterally, adequate air movement, no wheezing, rhonchi or rales. Gastrointestinal. Abdomen with no organomegaly, non tender, no rebound or  guarding Skin. Right maxillary ecchymosis.  Musculoskeletal: no joint deformities   The results of significant diagnostics from this hospitalization (including imaging, microbiology,  ancillary and laboratory) are listed below for reference.     Microbiology: Recent Results (from the past 240 hour(s))  Blood Culture (routine x 2)     Status: None (Preliminary result)   Collection Time: 04/24/19  6:03 PM   Specimen: BLOOD  Result Value Ref Range Status   Specimen Description BLOOD RIGHT ANTECUBITAL  Final   Special Requests   Final    BOTTLES DRAWN AEROBIC AND ANAEROBIC Blood Culture results may not be optimal due to an inadequate volume of blood received in culture bottles Performed at [facility] [address], [place], [place] [address]    Culture NO GROWTH 3 DAYS  Final   Report Status PENDING  Incomplete  Blood Culture (routine x 2)     Status: None (Preliminary result)   Collection Time: 04/24/19  6:03 PM   Specimen: BLOOD  Result Value Ref Range Status   Specimen Description BLOOD RIGHT UPPER ARM  Final   Special Requests   Final    BOTTLES DRAWN AEROBIC AND ANAEROBIC Blood Culture results may not be optimal due to an inadequate volume of blood received in culture bottles Performed at [facility], [place] [address], [place], [place] [address]    Culture NO GROWTH 3 DAYS  Final   Report Status PENDING  Incomplete  Urine culture     Status: None   Collection Time: 04/24/19  6:03 PM   Specimen: In/Out Cath Urine  Result Value Ref Range Status   Specimen Description IN/OUT CATH URINE  Final   Special Requests NONE  Final   Culture   Final    NO GROWTH Performed at [facility], [place] [address] [place], [place] [address]    Report Status 04/25/2019 FINAL  Final  SARS CORONAVIRUS 2 (TAT 6-24 HRS) Nasopharyngeal Nasopharyngeal Swab     Status: None   Collection Time: 04/24/19  6:30 PM   Specimen: Nasopharyngeal Swab  Result Value Ref Range Status   SARS  Coronavirus 2 NEGATIVE NEGATIVE Final    Comment: (NOTE) SARS-CoV-2 target nucleic acids are NOT DETECTED. The SARS-CoV-2 RNA is generally detectable in upper and lower respiratory specimens during the acute phase of infection. Negative results do not preclude SARS-CoV-2 infection, do not rule out co-infections with other pathogens, and should not be used as the sole basis for treatment or other patient management decisions. Negative results must be combined with clinical observations, patient history, and epidemiological information. The expected result is Negative. Fact Sheet for Patients: SugarRoll.be Fact Sheet for Healthcare Providers: https://www.woods-mathews.com/ This test is not yet approved or cleared by the [place] FDA and  has been authorized for detection and/or diagnosis of SARS-CoV-2 by FDA under an Emergency Use Authorization (EUA). This EUA will remain  in effect (meaning this test can be used) for the duration of the COVID-19 declaration under Section 56 4(b)(1) of the Act, 21 U.S.C. section 360bbb-3(b)(1), unless the authorization is terminated or revoked sooner. Performed at [facility], [place] [address] [place], [place] [address]   SARS CORONAVIRUS 2 (TAT 6-24 HRS) Nasopharyngeal Nasopharyngeal Swab     Status: None   Collection Time: 04/27/19 12:02 PM   Specimen: Nasopharyngeal Swab  Result Value Ref Range Status   SARS Coronavirus 2 NEGATIVE NEGATIVE Final    Comment: (NOTE) SARS-CoV-2 target nucleic acids are NOT DETECTED. The SARS-CoV-2 RNA is generally detectable in upper and lower respiratory specimens during the acute phase of infection. Negative results  do not preclude SARS-CoV-2 infection, do not rule out co-infections with other pathogens, and should not be used as the sole basis for treatment or other patient management decisions. Negative results must be combined with clinical  observations, patient history, and epidemiological information. The expected result is Negative. Fact Sheet for Patients: SugarRoll.be Fact Sheet for Healthcare Providers: https://www.woods-mathews.com/ This test is not yet approved or cleared by the [place] FDA and  has been authorized for detection and/or diagnosis of SARS-CoV-2 by FDA under an Emergency Use Authorization (EUA). This EUA will remain  in effect (meaning this test can be used) for the duration of the COVID-19 declaration under Section 56 4(b)(1) of the Act, 21 U.S.C. section 360bbb-3(b)(1), unless the authorization is terminated or revoked sooner. Performed at [facility], [place] [address] [place], [place] [address]      Labs: BNP (last 3 results) No results for input(s): BNP in the last 8760 hours. Basic Metabolic Panel: Recent Labs  Lab 04/24/19 1550 04/24/19 1721 04/25/19 0324 04/26/19 0310 04/27/19 0721  NA 144 144 146* 144 141  K 4.1 4.2 3.6 3.4* 4.0  CL 109 111 111 112* 105  CO2 22  --  23 23 25   GLUCOSE 162* 150* 143* 120* 132*  BUN 33* 38* 29* 24* 15  CREATININE 1.04 0.80 0.86 0.90 0.85  CALCIUM 8.6*  --  8.1* 7.7* 8.1*   Liver Function Tests: Recent Labs  Lab 04/24/19 1550 04/26/19 0310  AST 93* 68*  ALT 62* 50*  ALKPHOS 71 45  BILITOT 1.6* 1.1  PROT 7.0 5.2*  ALBUMIN 3.6 2.5*   No results for input(s): LIPASE, AMYLASE in the last 168 hours. No results for input(s): AMMONIA in the last 168 hours. CBC: Recent Labs  Lab 04/24/19 1550 04/24/19 1721 04/25/19 0324 04/26/19 0310  WBC 12.8*  --  9.9 8.7  NEUTROABS 10.6*  --   --   --   HGB 17.0 17.0 15.1 13.7  HCT 51.7 50.0 45.9 41.6  MCV 102.0*  --  101.3* 102.7*  PLT 224  --  185 149*   Cardiac Enzymes: Recent Labs  Lab 04/24/19 1550 04/25/19 0324 04/26/19 0310  CKTOTAL 2,166* 2,295* 1,135*   BNP: Invalid input(s): POCBNP CBG: Recent Labs  Lab 04/25/19 0633  04/27/19 0654 04/28/19 0621  GLUCAP 128* 94 125*   D-Dimer No results for input(s): DDIMER in the last 72 hours. Hgb A1c No results for input(s): HGBA1C in the last 72 hours. Lipid Profile No results for input(s): CHOL, HDL, LDLCALC, TRIG, CHOLHDL, LDLDIRECT in the last 72 hours. Thyroid function studies No results for input(s): TSH, T4TOTAL, T3FREE, THYROIDAB in the last 72 hours.  Invalid input(s): FREET3 Anemia work up No results for input(s): VITAMINB12, FOLATE, FERRITIN, TIBC, IRON, RETICCTPCT in the last 72 hours. Urinalysis    Component Value Date/Time   COLORURINE YELLOW 04/24/2019 1822   APPEARANCEUR CLEAR 04/24/2019 1822   LABSPEC 1.029 04/24/2019 1822   PHURINE 6.0 04/24/2019 1822   GLUCOSEU NEGATIVE 04/24/2019 1822   HGBUR SMALL (A) 04/24/2019 1822   BILIRUBINUR NEGATIVE 04/24/2019 1822   KETONESUR 80 (A) 04/24/2019 1822   PROTEINUR 30 (A) 04/24/2019 1822   UROBILINOGEN 0.2 04/01/2010 1230   NITRITE NEGATIVE 04/24/2019 1822   LEUKOCYTESUR NEGATIVE 04/24/2019 1822   Sepsis Labs Invalid input(s): PROCALCITONIN,  WBC,  LACTICIDVEN Microbiology Recent Results (from the past 240 hour(s))  Blood Culture (routine x 2)     Status: None (Preliminary result)  Collection Time: 04/24/19  6:03 PM   Specimen: BLOOD  Result Value Ref Range Status   Specimen Description BLOOD RIGHT ANTECUBITAL  Final   Special Requests   Final    BOTTLES DRAWN AEROBIC AND ANAEROBIC Blood Culture results may not be optimal due to an inadequate volume of blood received in culture bottles Performed at [facility] [address] [place], [place] [address]    Culture NO GROWTH 3 DAYS  Final   Report Status PENDING  Incomplete  Blood Culture (routine x 2)     Status: None (Preliminary result)   Collection Time: 04/24/19  6:03 PM   Specimen: BLOOD  Result Value Ref Range Status   Specimen Description BLOOD RIGHT UPPER ARM  Final   Special Requests   Final    BOTTLES DRAWN AEROBIC  AND ANAEROBIC Blood Culture results may not be optimal due to an inadequate volume of blood received in culture bottles Performed at [facility], [place] [address], [place], [place] [address]    Culture NO GROWTH 3 DAYS  Final   Report Status PENDING  Incomplete  Urine culture     Status: None   Collection Time: 04/24/19  6:03 PM   Specimen: In/Out Cath Urine  Result Value Ref Range Status   Specimen Description IN/OUT CATH URINE  Final   Special Requests NONE  Final   Culture   Final    NO GROWTH Performed at [facility], [place] [address] [place],  [address]    Report Status 04/25/2019 FINAL  Final  SARS CORONAVIRUS 2 (TAT 6-24 HRS) Nasopharyngeal Nasopharyngeal Swab     Status: None   Collection Time: 04/24/19  6:30 PM   Specimen: Nasopharyngeal Swab  Result Value Ref Range Status   SARS Coronavirus 2 NEGATIVE NEGATIVE Final    Comment: (NOTE) SARS-CoV-2 target nucleic acids are NOT DETECTED. The SARS-CoV-2 RNA is generally detectable in upper and lower respiratory specimens during the acute phase of infection. Negative results do not preclude SARS-CoV-2 infection, do not rule out co-infections with other pathogens, and should not be used as the sole basis for treatment or other patient management decisions. Negative results must be combined with clinical observations, patient history, and epidemiological information. The expected result is Negative. Fact Sheet for Patients: SugarRoll.be Fact Sheet for Healthcare Providers: https://www.woods-mathews.com/ This test is not yet approved or cleared by the [place] FDA and  has been authorized for detection and/or diagnosis of SARS-CoV-2 by FDA under an Emergency Use Authorization (EUA). This EUA will remain  in effect (meaning this test can be used) for the duration of the COVID-19 declaration under Section 56 4(b)(1) of the Act, 21 U.S.C. section 360bbb-3(b)(1),  unless the authorization is terminated or revoked sooner. Performed at [facility], [place] [address], [place], [place] [address]   SARS CORONAVIRUS 2 (TAT 6-24 HRS) Nasopharyngeal Nasopharyngeal Swab     Status: None   Collection Time: 04/27/19 12:02 PM   Specimen: Nasopharyngeal Swab  Result Value Ref Range Status   SARS Coronavirus 2 NEGATIVE NEGATIVE Final    Comment: (NOTE) SARS-CoV-2 target nucleic acids are NOT DETECTED. The SARS-CoV-2 RNA is generally detectable in upper and lower respiratory specimens during the acute phase of infection. Negative results do not preclude SARS-CoV-2 infection, do not rule out co-infections with other pathogens, and should not be used as the sole basis for treatment or other patient management decisions. Negative results must be combined with  clinical observations, patient history, and epidemiological information. The expected result is Negative. Fact Sheet for Patients: SugarRoll.be Fact Sheet for Healthcare Providers: https://www.woods-mathews.com/ This test is not yet approved or cleared by the [place] FDA and  has been authorized for detection and/or diagnosis of SARS-CoV-2 by FDA under an Emergency Use Authorization (EUA). This EUA will remain  in effect (meaning this test can be used) for the duration of the COVID-19 declaration under Section 56 4(b)(1) of the Act, 21 U.S.C. section 360bbb-3(b)(1), unless the authorization is terminated or revoked sooner. Performed at [facility], [place] [address] [place], [place] [address]      Time coordinating discharge: 45 minutes  SIGNED:   Tawni Millers, MD  [facility] 04/28/2019, 10:25 AM

## 2019-04-29 LAB — CULTURE, BLOOD (ROUTINE X 2)
Culture: NO GROWTH
Culture: NO GROWTH

## 2019-05-02 ENCOUNTER — Encounter: Payer: Self-pay | Admitting: Neurology

## 2019-05-02 ENCOUNTER — Telehealth: Payer: Self-pay | Admitting: Neurology

## 2019-05-02 ENCOUNTER — Encounter: Payer: Medicare Other | Admitting: Neurology

## 2019-05-02 NOTE — Telephone Encounter (Signed)
This patient did not show for EMG and nerve conduction study evaluation today.

## 2019-06-16 ENCOUNTER — Ambulatory Visit
Admission: RE | Admit: 2019-06-16 | Discharge: 2019-06-16 | Disposition: A | Discharge status: Home / Self Care | Payer: Medicare Other | Source: Ambulatory Visit | Attending: Radiation Oncology | Admitting: Radiation Oncology

## 2019-06-16 ENCOUNTER — Telehealth: Payer: Self-pay

## 2019-06-16 NOTE — Telephone Encounter (Signed)
Unable to reach patient to see how he was doing, and if he was aware/able to attend F/U appointment with Dr. Isidore Moos today. Called other contact listed (patient's sister Vanessa Barbara) to see if she had an update on the patient or knew of another way to reach patient. She stated she also had not been able to get in contact with the patient, but would reach out to patient's son and see if he had any information. Provided my direct line for sister to give patient's son so he could call me back.   Received call from patient's son Roper Hoeg stating that he was not aware of patient's appointment today, and that patient was currently residing [place] for the next year after sustaining a fall at home. Son was going to try and get in contact with staff at facility and see what would need to be arranged for patient to be transported for an appointment at [facility]. Assured son that it would be no problem to reschedule patient's follow-up appointment once he had details from facility. Son stated that he lives in [place] but would like to be patient's primary point of contact since his aunt (patient's sister) is showing signs of early dementia. Contacts updated in chart accordingly. No other needs or concerns identified at this time, but son has my direct number and knows to call should something arise in the future.

## 2019-10-18 ENCOUNTER — Ambulatory Visit: Payer: Medicare Other | Admitting: Neurology

## 2019-10-18 ENCOUNTER — Telehealth: Payer: Self-pay | Admitting: Neurology

## 2019-10-18 ENCOUNTER — Encounter: Payer: Self-pay | Admitting: Neurology

## 2019-10-18 NOTE — Telephone Encounter (Signed)
This is the second no-show for revisit for this patient.

## 2020-08-16 ENCOUNTER — Encounter: Payer: Self-pay | Admitting: *Deleted

## 2020-09-16 ENCOUNTER — Ambulatory Visit: Payer: Medicare Other | Admitting: Cardiology

## 2020-09-17 ENCOUNTER — Encounter: Payer: Self-pay | Admitting: Cardiology

## 2021-05-27 ENCOUNTER — Emergency Department
Admission: EM | Admit: 2021-05-27 | Discharge: 2021-05-27 | Disposition: A | Discharge status: Skilled Nursing Facility | Payer: Medicare Other | Attending: Emergency Medicine | Admitting: Emergency Medicine

## 2021-05-27 ENCOUNTER — Emergency Department: Payer: Medicare Other

## 2021-05-27 ENCOUNTER — Other Ambulatory Visit: Payer: Self-pay

## 2021-05-27 ENCOUNTER — Encounter: Payer: Self-pay | Admitting: Emergency Medicine

## 2021-05-27 DIAGNOSIS — S8991XA Unspecified injury of right lower leg, initial encounter: Secondary | ICD-10-CM | POA: Diagnosis present

## 2021-05-27 DIAGNOSIS — W050XXA Fall from non-moving wheelchair, initial encounter: Secondary | ICD-10-CM | POA: Diagnosis not present

## 2021-05-27 DIAGNOSIS — S83411A Sprain of medial collateral ligament of right knee, initial encounter: Secondary | ICD-10-CM | POA: Insufficient documentation

## 2021-05-27 NOTE — ED Triage Notes (Signed)
Pt to ED via [facility] with c/o falling out of his wheel chair. Pt states that his right knee is hurting. No deformity seen  ?

## 2021-05-27 NOTE — ED Notes (Signed)
Ems called for pt to go back to [facility] ?

## 2021-05-27 NOTE — ED Triage Notes (Signed)
FIRST NURSE NOTE:  ?Pt via EMS from [facility]. Pt c/o a fall, pt fell out of wheelchair. Pt c/o R leg pain. No obvious deformity noted. Pt was laying in the floor for approx 45 mins. Pt is A&OX4 and NAD ? ?94 HR  ?96% on RA  ?156/86 ? ?

## 2021-05-27 NOTE — ED Provider Notes (Signed)
? ?[facility] ?Provider Note ? ?Patient Contact: 7:07 PM (approximate) ? ? ?History  ? ?Fall and Knee Pain ? ? ?HPI ? ?[name] is a 88 y.o. male who presents the emergency department complaining of right knee pain.  Patient states that he was trying to get out of his chair when his foot got caught as he was trying to stand.  He states that he was trying to get his foot free but was putting a large amount of stress on the inside part of his knee.  Patient did not actually fall, sustained no direct trauma to the knee.  He sat back down.  Patient is complaining of some ongoing medial knee pain.  No history of previous injuries or surgeries.  No other complaints at this time. ?  ? ? ?Physical Exam  ? ?Triage Vital Signs: ?ED Triage Vitals  ?Enc Vitals Group  ?   BP 05/27/21 1848 137/85  ?   Pulse Rate 05/27/21 1848 94  ?   Resp 05/27/21 1848 20  ?   Temp 05/27/21 1848 98.4 ?F (36.9 ?C)  ?   Temp Source 05/27/21 1848 Oral  ?   SpO2 05/27/21 1848 96 %  ?   Weight 05/27/21 1849 220 lb (99.8 kg)  ?   Height 05/27/21 1849 '5\' 10"'$  (1.778 m)  ?   Head Circumference --   ?   Peak Flow --   ?   Pain Score 05/27/21 1848 3  ?   Pain Loc --   ?   Pain Edu? --   ?   Excl. in [place]? --   ? ? ?Most recent vital signs: ?Vitals:  ? 05/27/21 1848  ?BP: 137/85  ?Pulse: 94  ?Resp: 20  ?Temp: 98.4 ?F (36.9 ?C)  ?SpO2: 96%  ? ? ? ?General: Alert and in no acute distress.  ?Cardiovascular:  Good peripheral perfusion ?Respiratory: Normal respiratory effort without tachypnea or retractions. Lungs CTAB.  ?Musculoskeletal: Full range of motion to all extremities.  No obvious deformity to the right knee.  No gross edema, ecchymosis.  No open wounds.  Patient is nontender to palpation over the osseous and muscular structures of the knee.  Varus, valgus, Lachman's and McMurray's is negative.  Dorsalis pedis pulse intact distally.  Patient is nontender over the proximal and distal joints. ?Neurologic:  No gross focal  neurologic deficits are appreciated.  ?Skin:   No rash noted ?Other: ? ? ?ED Results / Procedures / Treatments  ? ?Labs ?(all labs ordered are listed, but only abnormal results are displayed) ?Labs Reviewed - No data to display ? ? ?EKG ? ? ? ? ?RADIOLOGY ? ?I personally viewed and evaluated these images as part of my medical decision making, as well as reviewing the written report by the radiologist. ? ?ED Provider Interpretation: No acute traumatic findings on x-ray of the knee.  No joint effusion, fractures or dislocation. ? ?DG Knee 2 Views Right ? ?Result Date: 05/27/2021 ?CLINICAL DATA:  fell out of wheelchair having right knee pain EXAM: RIGHT KNEE - 1-2 VIEW COMPARISON:  None. FINDINGS: No evidence of fracture, dislocation, or joint effusion. Patellar enthesopathy. No evidence of arthropathy or other focal bone abnormality. Soft tissues are unremarkable. Vascular calcifications. IMPRESSION: Negative. Electronically Signed   By: Iven Finn M.D.   On: 05/27/2021 19:22   ? ?PROCEDURES: ? ?Critical Care performed: No ? ?Procedures ? ? ?MEDICATIONS ORDERED IN ED: ?Medications - No data to display ? ? ?  IMPRESSION / MDM / ASSESSMENT AND PLAN / ED COURSE  ?I reviewed the triage vital signs and the nursing notes. ?             ?               ? ?Differential diagnosis includes, but is not limited to, knee sprain, ligament rupture, fracture.   ? ?Patient's diagnosis is consistent with knee sprain. Patient presented to the emergency department complaining of medial knee pain.  He states that he was trying to get out of a chair, his foot got caught behind the leg keeping him in a twisted position as he was trying to stand.  Patient states that he was having pain along the medial aspect of his left knee.  Still able to bear weight on same.  He did not have any direct fall onto the knee.  No other injury or complaint at this time.  Imaging of the knee was reassuring with no fracture or dislocation.  No joint effusion.   Special tests of the knee were negative.  Discussed results with the patient and recommendation for joint rest.  Follow-up with orthopedics if symptoms or not improving with conservative measures. Patient is given ED precautions to return to the ED for any worsening or new symptoms. ? ? ? ?  ? ? ?FINAL CLINICAL IMPRESSION(S) / ED DIAGNOSES  ? ?Final diagnoses:  ?Sprain of medial collateral ligament of right knee, initial encounter  ? ? ? ?Rx / DC Orders  ? ?ED Discharge Orders   ? ? None  ? ?  ? ? ? ?Note:  This document was prepared using Dragon voice recognition software and may include unintentional dictation errors. ?  ?Darletta Moll, PA-C ?05/27/21 2017 ? ?  ?Vladimir Crofts, MD ?05/27/21 2122 ? ?

## 2021-09-19 ENCOUNTER — Other Ambulatory Visit: Payer: Self-pay | Admitting: Physician Assistant

## 2021-09-19 DIAGNOSIS — R42 Dizziness and giddiness: Secondary | ICD-10-CM

## 2021-09-19 DIAGNOSIS — R269 Unspecified abnormalities of gait and mobility: Secondary | ICD-10-CM

## 2021-09-19 DIAGNOSIS — R2689 Other abnormalities of gait and mobility: Secondary | ICD-10-CM

## 2021-10-01 ENCOUNTER — Ambulatory Visit
Admission: RE | Admit: 2021-10-01 | Discharge: 2021-10-01 | Disposition: A | Discharge status: Home / Self Care | Payer: Medicare Other | Source: Ambulatory Visit | Attending: Physician Assistant | Admitting: Physician Assistant

## 2021-10-01 DIAGNOSIS — R42 Dizziness and giddiness: Secondary | ICD-10-CM | POA: Insufficient documentation

## 2021-10-01 DIAGNOSIS — R2689 Other abnormalities of gait and mobility: Secondary | ICD-10-CM | POA: Insufficient documentation

## 2021-10-01 DIAGNOSIS — R269 Unspecified abnormalities of gait and mobility: Secondary | ICD-10-CM | POA: Diagnosis present

## 2022-02-06 ENCOUNTER — Other Ambulatory Visit: Payer: Self-pay | Admitting: Student

## 2022-02-06 DIAGNOSIS — Z86718 Personal history of other venous thrombosis and embolism: Secondary | ICD-10-CM

## 2022-02-06 DIAGNOSIS — R42 Dizziness and giddiness: Secondary | ICD-10-CM

## 2022-02-17 ENCOUNTER — Ambulatory Visit
Admission: RE | Admit: 2022-02-17 | Discharge: 2022-02-17 | Disposition: A | Discharge status: Home / Self Care | Payer: Medicare Other | Source: Ambulatory Visit | Attending: Student | Admitting: Student

## 2022-02-17 DIAGNOSIS — R42 Dizziness and giddiness: Secondary | ICD-10-CM | POA: Diagnosis present

## 2022-02-17 DIAGNOSIS — G08 Intracranial and intraspinal phlebitis and thrombophlebitis: Secondary | ICD-10-CM | POA: Diagnosis not present

## 2022-02-17 DIAGNOSIS — Z86718 Personal history of other venous thrombosis and embolism: Secondary | ICD-10-CM | POA: Diagnosis present

## 2022-05-06 ENCOUNTER — Telehealth: Payer: Self-pay

## 2022-05-06 NOTE — Telephone Encounter (Signed)
Introductory phone call made to patient prior to his new patient appointment with Dr. Tasia Catchings on 05/07/22. I introduced myself to patient and asked him if he had any questions about his upcoming appointment. Patient stated that he was given directions to the [place]. Patient asked me what would happen at [place] appointment. I told patient that Dr. Tasia Catchings would see he for a consultation and then she would more then likely get some lab work before he left. Patient thanked me for calling and said that he looks forward to meeting [place].

## 2022-05-07 ENCOUNTER — Inpatient Hospital Stay: Payer: Medicare Other | Attending: Oncology | Admitting: Oncology

## 2022-05-07 ENCOUNTER — Inpatient Hospital Stay: Payer: Medicare Other

## 2022-05-07 ENCOUNTER — Encounter: Payer: Self-pay | Admitting: Oncology

## 2022-05-07 VITALS — BP 135/84 | HR 80 | Temp 98.1°F | Resp 18 | Wt 189.0 lb

## 2022-05-07 DIAGNOSIS — I10 Essential (primary) hypertension: Secondary | ICD-10-CM | POA: Diagnosis not present

## 2022-05-07 DIAGNOSIS — I676 Nonpyogenic thrombosis of intracranial venous system: Secondary | ICD-10-CM | POA: Insufficient documentation

## 2022-05-07 DIAGNOSIS — Z9181 History of falling: Secondary | ICD-10-CM | POA: Diagnosis not present

## 2022-05-07 DIAGNOSIS — Z86711 Personal history of pulmonary embolism: Secondary | ICD-10-CM | POA: Insufficient documentation

## 2022-05-07 DIAGNOSIS — Z85828 Personal history of other malignant neoplasm of skin: Secondary | ICD-10-CM | POA: Insufficient documentation

## 2022-05-07 DIAGNOSIS — Z86718 Personal history of other venous thrombosis and embolism: Secondary | ICD-10-CM | POA: Diagnosis not present

## 2022-05-07 DIAGNOSIS — H409 Unspecified glaucoma: Secondary | ICD-10-CM | POA: Diagnosis not present

## 2022-05-07 DIAGNOSIS — Z923 Personal history of irradiation: Secondary | ICD-10-CM | POA: Diagnosis not present

## 2022-05-07 DIAGNOSIS — G08 Intracranial and intraspinal phlebitis and thrombophlebitis: Secondary | ICD-10-CM

## 2022-05-07 DIAGNOSIS — I4891 Unspecified atrial fibrillation: Secondary | ICD-10-CM | POA: Insufficient documentation

## 2022-05-07 DIAGNOSIS — G4700 Insomnia, unspecified: Secondary | ICD-10-CM | POA: Insufficient documentation

## 2022-05-07 DIAGNOSIS — Z79899 Other long term (current) drug therapy: Secondary | ICD-10-CM | POA: Diagnosis not present

## 2022-05-07 LAB — HEPATIC FUNCTION PANEL
ALT: 13 U/L (ref 0–44)
AST: 16 U/L (ref 15–41)
Albumin: 4 g/dL (ref 3.5–5.0)
Alkaline Phosphatase: 83 U/L (ref 38–126)
Bilirubin, Direct: 0.1 mg/dL (ref 0.0–0.2)
Indirect Bilirubin: 0.5 mg/dL (ref 0.3–0.9)
Total Bilirubin: 0.6 mg/dL (ref 0.3–1.2)
Total Protein: 7.2 g/dL (ref 6.5–8.1)

## 2022-05-07 LAB — CBC WITH DIFFERENTIAL/PLATELET
Abs Immature Granulocytes: 0.02 10*3/uL (ref 0.00–0.07)
Basophils Absolute: 0 10*3/uL (ref 0.0–0.1)
Basophils Relative: 1 %
Eosinophils Absolute: 0.2 10*3/uL (ref 0.0–0.5)
Eosinophils Relative: 3 %
HCT: 39.3 % (ref 39.0–52.0)
Hemoglobin: 13 g/dL (ref 13.0–17.0)
Immature Granulocytes: 0 %
Lymphocytes Relative: 17 %
Lymphs Abs: 1 10*3/uL (ref 0.7–4.0)
MCH: 33.2 pg (ref 26.0–34.0)
MCHC: 33.1 g/dL (ref 30.0–36.0)
MCV: 100.3 fL — ABNORMAL HIGH (ref 80.0–100.0)
Monocytes Absolute: 0.4 10*3/uL (ref 0.1–1.0)
Monocytes Relative: 8 %
Neutro Abs: 3.9 10*3/uL (ref 1.7–7.7)
Neutrophils Relative %: 71 %
Platelets: 203 10*3/uL (ref 150–400)
RBC: 3.92 MIL/uL — ABNORMAL LOW (ref 4.22–5.81)
RDW: 13.2 % (ref 11.5–15.5)
WBC: 5.5 10*3/uL (ref 4.0–10.5)
nRBC: 0 % (ref 0.0–0.2)

## 2022-05-07 LAB — APTT: aPTT: 26 seconds (ref 24–36)

## 2022-05-07 LAB — ANTITHROMBIN III: AntiThromb III Func: 96 % (ref 75–120)

## 2022-05-07 LAB — PROTIME-INR
INR: 1.1 (ref 0.8–1.2)
Prothrombin Time: 13.6 seconds (ref 11.4–15.2)

## 2022-05-07 LAB — LACTATE DEHYDROGENASE: LDH: 120 U/L (ref 98–192)

## 2022-05-07 NOTE — Assessment & Plan Note (Addendum)
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  History of atrial fibrillation, not on anticoagulation due to fall risk.  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  He had MRI in August 2023 which showed dilation of the ventricular system out of proportion the degree of sulcal prominence.  Recommend hypercoagulable workup.  Likely this is subacute/chronic events.  He does not have severe symptoms  Check CBC, LFT, protein C, and S, Antithrombin III, factor V Leiden mutation, prothrombin gene mutation, PT, PTT, LDH. I discussed the patient about anticoagulation options and my concern of his bleeding risk.  Hypercoagulable work up is negative.  hypercoagulable workup is negative,  I discussed with radiologist Dr.Grady. findings on Dec 2023 MRV may be chronic finding. I recommend repeat MRV brain w and wo contrast to follow up. If there is thrombosis progression, consider anticoagulation with Eliquis.  If thrombosis is chronic, given his age and fall risk, I favor no anticoagulation, he can repeat image for monitoring. I will finalize treatment plan after his workup comes back. I discussed with patient about above plan over the phone and he agrees with above plan.

## 2022-05-07 NOTE — Progress Notes (Addendum)
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    ASSESSMENT & PLAN:   Cerebral venous thrombosis Patient has remote history of bilateral PE provoked by hip replacement.  History of atrial fibrillation, not on anticoagulation due to fall risk.  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  He had MRI in August 2023 which showed dilation of the ventricular system out of proportion the degree of sulcal prominence.  Recommend hypercoagulable workup.  Likely this is subacute/chronic events.  He does not have severe symptoms  Check CBC, LFT, protein C, and S, Antithrombin III, factor V Leiden mutation, prothrombin gene mutation, PT, PTT, LDH. I discussed the patient about anticoagulation options and my concern of his bleeding risk.  Hypercoagulable work up is negative.  hypercoagulable workup is negative,  I discussed with radiologist Dr.Grady. findings on Dec 2023 MRV may be chronic finding. I recommend repeat MRV brain w and wo contrast to follow up. If there is thrombosis progression, consider anticoagulation with Eliquis.  If thrombosis is chronic, given his age and fall risk, I favor no anticoagulation, he can repeat image for monitoring. I will finalize treatment plan after his workup comes back. I discussed with patient about above plan over the phone and he agrees with above plan.   Orders Placed This Encounter  Procedures   CBC with Differential/Platelet    Standing Status:   Future    Number of Occurrences:   1    Standing Expiration Date:   05/07/2023   Lactate dehydrogenase    Standing Status:   Future    Number of Occurrences:   1    Standing Expiration Date:   05/07/2023   ANTIPHOSPHOLIPID SYNDROME PROF    Standing Status:   Future    Number of Occurrences:   1     Standing Expiration Date:   05/07/2023   Protein S, total and free    Standing Status:   Future    Number of Occurrences:   1    Standing Expiration Date:   05/07/2023   Prothrombin gene mutation    Standing Status:   Future    Number of Occurrences:   1    Standing Expiration Date:   05/07/2023   Protein C activity    Standing Status:   Future    Number of Occurrences:   1    Standing Expiration Date:   05/07/2023   Factor 5 leiden    Standing Status:   Future    Number of Occurrences:   1    Standing Expiration Date:   05/07/2023   Antithrombin III    Standing Status:   Future    Number of Occurrences:   1    Standing Expiration Date:   05/07/2023   Hepatic function panel    Standing Status:   Future    Number of Occurrences:   1    Standing Expiration Date:   05/07/2023   APTT    Standing Status:   Future    Number of Occurrences:   1    Standing Expiration Date:   05/07/2023   Protime-INR    Standing Status:   Future    Number of Occurrences:   1    Standing Expiration Date:   05/07/2023   TBD All questions  were answered. The patient knows to call the clinic with any problems, questions or concerns.  Earlie Server, MD, PhD [facility] Hematology Oncology 05/07/2022   HISTORY OF PRESENTING ILLNESS:   [name] is a  89 y.o.  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 Patient reports chronic dizziness for the past couple of years. He underwent workup patient today. 10/11/2021, MRI brain without contrast showed no acute intracranial pathology or significant change in the brain since 04/09/2019.  Unchanged dilation of the ventricular system out of proportion to the degree of sulcal prominence with crowding of the gyri at the left vertex  02/18/2021 MRI MRV head without contrast showed thrombosis of distal right transverse sinus and right sigmoid sinus.   He has a history of pulmonary embolism  bilaterally in 2012 and the patient took Coumadin for 6 months and anticoagulation was stopped. Pulmonary embolism was felt to be provoked by his hip replacement In 2020 patient has neck invasive skin cancer status post right modified radical neck dissection, patient is status post adjuvant radiation as well.  Patient has a history of atrial fibrillation, CHADSVASCs score of 5 patient was seen in 2021 by cardiology Dr. Candee Furbish who felt that risk of bleeding outweighs benefits.  Patient takes aspirin 81 mg.  Patient has a history of falls.  He lives in a retirement home.  He does not have any live close family member Patient reports that he has had some weight loss.  Reports being forgetful lately. Patient cannot remember his previous medical history including pulmonary embolism, atrial fibrillation.  Extensive record review was performed.  Patient denies headache, seizure, vision changes, focal deficits.  He denies recent COVID-19 infection.  MEDICAL HISTORY:  Past Medical History:  Diagnosis Date   A-fib ([place])    Back pain    Cor pulmonale (chronic) (HCC)    DVT (deep venous thrombosis) ([place])    Gait abnormality 05/23/2018   Glaucoma    Hypertension    Insomnia    Pulmonary embolism, bilateral ([place]) 07/2010   on coumadin   Skin cancer    forehead   Skin cancer    Vertigo     SURGICAL HISTORY: Past Surgical History:  Procedure Laterality Date   BACK SURGERY     x3   EXCISION MASS NECK Right 01/19/2018   Procedure: Wide local excision of right neck skin cancer (5 cm);  Surgeon: Melida Quitter, MD;  Location: [place];  Service: ENT;  Laterality: Right;   FOOT SURGERY     RADICAL NECK DISSECTION Right 01/19/2018    Right modified radical neck dissection; Cervicopectoral flap (Right Neck)   RADICAL NECK DISSECTION Right 01/19/2018   Procedure: Right modified radical neck dissection; Cervicopectoral flap;  Surgeon: Melida Quitter, MD;  Location: [place];  Service: ENT;  Laterality:  Right;   SHOULDER SURGERY     x3  Dr. Karrie Doffing al.   TOTAL HIP ARTHROPLASTY  2012   Dr. Alvan Dame right hip   TRANSTHORACIC ECHOCARDIOGRAM  07/26/2010   Left ventricle: The cavity size was normal. Wall thickness was increased in a pattern of moderate LVH. Systolic function was normal. The estimated ejection fraction was in the range of 55% to 60%    SOCIAL HISTORY: Social History   Socioeconomic History   Marital status: Widowed    Spouse name: Not on file   Number of children:  1   Years of education: Not on file   Highest education level: Some college, no degree  Occupational History   Not on file  Tobacco Use   Smoking status: Former    Packs/day: 1.00    Years: 25.00    Total pack years: 25.00    Types: Cigarettes    Quit date: 03/02/1981    Years since quitting: 41.2   Smokeless tobacco: Former    Types: Chew    Quit date: 03/02/2008  Vaping Use   Vaping Use: Never used  Substance and Sexual Activity   Alcohol use: Yes    Comment: wine 3-4 times a week   Drug use: No   Sexual activity: Not on file  Other Topics Concern   Not on file  Social History Narrative   Right handed    2 cups of caffeine per day    Lives at home alone    Social Determinants of Health   Financial Resource Strain: Not on file  Food Insecurity: Not on file  Transportation Needs: No Transportation Needs (02/16/2018)   PRAPARE - Hydrologist (Medical): No    Lack of Transportation (Non-Medical): No  Physical Activity: Not on file  Stress: Not on file  Social Connections: Not on file  Intimate Partner Violence: Not At Risk (02/16/2018)   Humiliation, Afraid, Rape, and Kick questionnaire    Fear of Current or Ex-Partner: No    Emotionally Abused: No    Physically Abused: No    Sexually Abused: No    FAMILY HISTORY: Family History  Problem Relation Age of Onset   Alzheimer's disease Mother    Stroke Father    Lung cancer Brother    Cancer Son      ALLERGIES:  is allergic to codeine and morphine and related.  MEDICATIONS:  Current Outpatient Medications  Medication Sig Dispense Refill   Ascorbic Acid (VITAMIN C) 1000 MG tablet Take 1,000 mg by mouth daily.       aspirin EC 81 MG tablet Take 81 mg by mouth daily.     cholecalciferol (VITAMIN D3) 25 MCG (1000 UT) tablet Take 1,000 Units by mouth daily.      Copper Gluconate (COPPER CAPS) 2 MG CAPS Take 2 mg by mouth daily.      diazepam (VALIUM) 2 MG tablet Take 0.5 tablets (1 mg total) by mouth 2 (two) times daily as needed for anxiety. 10 tablet 0   diltiazem (CARDIZEM CD) 120 MG 24 hr capsule Take 1 capsule (120 mg total) by mouth daily. 30 capsule 0   diphenhydrAMINE (BENADRYL) 25 MG tablet Take 25 mg by mouth daily as needed (runny nose).      GLUCOSAMINE-CHONDROITIN PO Take 1 tablet by mouth daily.     latanoprost (XALATAN) 0.005 % ophthalmic solution Place 1 drop into both eyes at bedtime.     mirtazapine (REMERON) 30 MG tablet Take 30 mg by mouth at bedtime.     Red Yeast Rice 600 MG CAPS Take 1,200 mg by mouth daily.      sildenafil (VIAGRA) 25 MG tablet Take 25 mg by mouth daily as needed for erectile dysfunction.     traZODone (DESYREL) 100 MG tablet Take 100 mg by mouth at bedtime.     vitamin B-12 (CYANOCOBALAMIN) 1000 MCG tablet Take 1,000 mcg by mouth daily. (Patient not taking: Reported on 05/07/2022)     vitamin E 400 UNIT capsule Take 400 Units by mouth daily.  (  Patient not taking: Reported on 05/07/2022)     No current facility-administered medications for this visit.    Review of Systems  Constitutional:  Positive for unexpected weight change. Negative for chills, fatigue and fever.  HENT:   Negative for hearing loss and voice change.   Eyes:  Negative for eye problems and icterus.  Respiratory:  Negative for chest tightness, cough and shortness of breath.   Cardiovascular:  Negative for chest pain and leg swelling.  Gastrointestinal:  Negative for abdominal  distention and abdominal pain.  Endocrine: Negative for hot flashes.  Genitourinary:  Negative for difficulty urinating, dysuria and frequency.   Musculoskeletal:  Negative for arthralgias.  Skin:  Negative for itching and rash.  Neurological:  Negative for light-headedness and numbness.  Hematological:  Negative for adenopathy. Does not bruise/bleed easily.  Psychiatric/Behavioral:  Negative for confusion.    PHYSICAL EXAMINATION: ECOG PERFORMANCE STATUS: 1 - Symptomatic but completely ambulatory Vitals:   05/07/22 1510  BP: 135/84  Pulse: 80  Resp: 18  Temp: 98.1 F (36.7 C)   Filed Weights   05/07/22 1510  Weight: 189 lb (85.7 kg)    Physical Exam Constitutional:      General: He is not in acute distress.    Comments: Patient ambulates with a cane  HENT:     Head: Normocephalic and atraumatic.  Eyes:     General: No scleral icterus. Cardiovascular:     Rate and Rhythm: Normal rate.     Heart sounds: Normal heart sounds.  Pulmonary:     Effort: Pulmonary effort is normal. No respiratory distress.     Breath sounds: No wheezing.  Abdominal:     General: Bowel sounds are normal. There is no distension.     Palpations: Abdomen is soft.  Musculoskeletal:        General: No deformity. Normal range of motion.     Cervical back: Normal range of motion and neck supple.  Skin:    General: Skin is warm and dry.  Neurological:     Mental Status: He is alert and oriented to person, place, and time. Mental status is at baseline.  Psychiatric:        Mood and Affect: Mood normal.    LABORATORY DATA:  I have reviewed the data as listed    Latest Ref Rng & Units 05/07/2022    3:48 PM 04/26/2019    3:10 AM 04/25/2019    3:24 AM  CBC  WBC 4.0 - 10.5 K/uL 5.5  8.7  9.9   Hemoglobin 13.0 - 17.0 g/dL 13.0  13.7  15.1   Hematocrit 39.0 - 52.0 % 39.3  41.6  45.9   Platelets 150 - 400 K/uL 203  149  185       Latest Ref Rng & Units 05/07/2022    3:48 PM 04/27/2019    7:21 AM  04/26/2019    3:10 AM  CMP  Glucose 70 - 99 mg/dL  132  120   BUN 8 - 23 mg/dL  15  24   Creatinine 0.61 - 1.24 mg/dL  0.85  0.90   Sodium 135 - 145 mmol/L  141  144   Potassium 3.5 - 5.1 mmol/L  4.0  3.4   Chloride 98 - 111 mmol/L  105  112   CO2 22 - 32 mmol/L  25  23   Calcium 8.9 - 10.3 mg/dL  8.1  7.7   Total Protein 6.5 - 8.1 g/dL 7.2  5.2   Total Bilirubin 0.3 - 1.2 mg/dL 0.6   1.1   Alkaline Phos 38 - 126 U/L 83   45   AST 15 - 41 U/L 16   68   ALT 0 - 44 U/L 13   50       RADIOGRAPHIC STUDIES: I have personally reviewed the radiological images as listed and agreed with the findings in the report. MR MRV HEAD WO CM  Result Date: 02/18/2022 CLINICAL DATA:  Chronic dizziness. History of deep vein thrombosis. Abnormal appearance of the right sigmoid sinus and jugular bulb on MRI. EXAM: MR VENOGRAM HEAD WITHOUT CONTRAST TECHNIQUE: Angiographic images of the intracranial venous structures were acquired using MRV technique without intravenous contrast. COMPARISON:  Head MRI 10/01/2021 FINDINGS: The superior sagittal sinus, internal cerebral veins, vein of Galen, straight sinus, and left transverse and sigmoid sinuses appear widely patent. Flow is visible in the proximal and mid aspects of the right transverse sinus which is small in caliber, however no flow related enhancement is present in the distal aspect of the right transverse sinus or in the entirety of the right sigmoid sinus and jugular bulb corresponding to the loss of a flow void on the prior head MRI. IMPRESSION: Thrombosis of the distal right transverse sinus and right sigmoid sinus. Electronically Signed   By: Logan Bores M.D.   On: 02/18/2022 17:01

## 2022-05-08 LAB — PROTEIN S, TOTAL AND FREE
Protein S Ag, Free: 92 % (ref 61–136)
Protein S Ag, Total: 65 % (ref 60–150)

## 2022-05-08 LAB — ANTIPHOSPHOLIPID SYNDROME PROF
Anticardiolipin IgG: <9 GPL U/mL (ref 0–14)
Anticardiolipin IgM: <9 MPL U/mL (ref 0–12)
DRVVT: 29.4 s (ref 0.0–47.0)
PTT Lupus Anticoagulant: 28.2 s (ref 0.0–43.5)

## 2022-05-08 LAB — PROTEIN C ACTIVITY: Protein C Activity: 103 % (ref 73–180)

## 2022-05-18 ENCOUNTER — Telehealth: Payer: Self-pay

## 2022-05-18 DIAGNOSIS — G08 Intracranial and intraspinal phlebitis and thrombophlebitis: Secondary | ICD-10-CM

## 2022-05-18 NOTE — Telephone Encounter (Signed)
Dr. Tasia Catchings has contacted pt with plan.   Please contact pt with appt details.   MRV brain w wo ASAP Labs prior to having MRV scan.

## 2022-05-18 NOTE — Telephone Encounter (Signed)
-----   Message from Earlie Server, MD sent at 05/18/2022  8:56 AM EDT ----- Please arrange patient to get MRV brain w wo contrast ASAP,  discussed with patient he is aware about the plan.  Thanks.  zy

## 2022-05-19 ENCOUNTER — Inpatient Hospital Stay: Payer: Medicare Other | Attending: Oncology

## 2022-05-19 ENCOUNTER — Ambulatory Visit: Admission: RE | Admit: 2022-05-19 | Payer: Medicare Other | Source: Ambulatory Visit

## 2022-05-19 ENCOUNTER — Encounter: Payer: Self-pay | Admitting: Oncology

## 2022-05-19 NOTE — Telephone Encounter (Signed)
Looks like pt no showed to appts. Please r/s the lab and scan - next week is ok. Please inform pt of appt

## 2022-05-20 LAB — PROTHROMBIN GENE MUTATION

## 2022-05-20 LAB — FACTOR 5 LEIDEN

## 2022-06-08 ENCOUNTER — Other Ambulatory Visit: Payer: Medicare Other

## 2022-06-09 ENCOUNTER — Inpatient Hospital Stay: Payer: Medicare Other | Attending: Oncology

## 2022-06-09 ENCOUNTER — Ambulatory Visit
Admission: RE | Admit: 2022-06-09 | Discharge: 2022-06-09 | Disposition: A | Discharge status: Home / Self Care | Payer: Medicare Other | Source: Ambulatory Visit | Attending: Oncology | Admitting: Oncology

## 2022-06-09 DIAGNOSIS — I676 Nonpyogenic thrombosis of intracranial venous system: Secondary | ICD-10-CM | POA: Diagnosis present

## 2022-06-09 DIAGNOSIS — G08 Intracranial and intraspinal phlebitis and thrombophlebitis: Secondary | ICD-10-CM

## 2022-06-09 LAB — BASIC METABOLIC PANEL - CANCER CENTER ONLY
Anion gap: 9 (ref 5–15)
BUN: 27 mg/dL — ABNORMAL HIGH (ref 8–23)
CO2: 25 mmol/L (ref 22–32)
Calcium: 10.4 mg/dL — ABNORMAL HIGH (ref 8.9–10.3)
Chloride: 102 mmol/L (ref 98–111)
Creatinine: 1.29 mg/dL — ABNORMAL HIGH (ref 0.61–1.24)
GFR, Estimated: 54 mL/min — ABNORMAL LOW (ref >60)
Glucose, Bld: 101 mg/dL — ABNORMAL HIGH (ref 70–99)
Potassium: 3.8 mmol/L (ref 3.5–5.1)
Sodium: 136 mmol/L (ref 135–145)

## 2022-06-09 MED ORDER — GADOBUTROL 1 MMOL/ML IV SOLN
9.0000 mL | Freq: Once | INTRAVENOUS | Status: AC | PRN
  Administered 2022-06-09: 9 mL via INTRAVENOUS

## 2022-06-12 ENCOUNTER — Telehealth: Payer: Self-pay

## 2022-06-12 DIAGNOSIS — R7989 Other specified abnormal findings of blood chemistry: Secondary | ICD-10-CM

## 2022-06-12 DIAGNOSIS — G08 Intracranial and intraspinal phlebitis and thrombophlebitis: Secondary | ICD-10-CM

## 2022-06-12 NOTE — Telephone Encounter (Signed)
-----   Message from Rickard Patience, MD sent at 06/11/2022  8:42 PM EDT ----- Please let patient know that his MRV results showed chronic blood clot, not worse.  Given his high risk of fall and bleeding risk, I recommend observation.  His kidney function appears to be worse on the day of imaging. I wonder if he was NPO or a bit dehydrated. Encourage oral hydration, repeat BMP next week.  Follow up appt in 4 months MD only.  Thanks zy

## 2022-06-12 NOTE — Telephone Encounter (Signed)
Called and spoke to pt, informed him of MD recommendations and follow up plan. He verbalized understanding.   Please schedule and inform pt of appt:  Lab next week (bmp)  MD in 4 months

## 2022-06-18 ENCOUNTER — Inpatient Hospital Stay: Payer: Medicare Other

## 2022-06-29 ENCOUNTER — Telehealth: Payer: Self-pay | Admitting: Oncology

## 2022-06-29 NOTE — Telephone Encounter (Signed)
Pt called and wanted to know the results of his MRI. He would like a call back to discuss results

## 2022-06-29 NOTE — Telephone Encounter (Signed)
Returned call to pt and informed him of MRI results.

## 2022-07-02 ENCOUNTER — Inpatient Hospital Stay: Payer: Medicare Other | Attending: Oncology

## 2022-07-02 DIAGNOSIS — G08 Intracranial and intraspinal phlebitis and thrombophlebitis: Secondary | ICD-10-CM

## 2022-07-02 DIAGNOSIS — I676 Nonpyogenic thrombosis of intracranial venous system: Secondary | ICD-10-CM | POA: Diagnosis present

## 2022-07-02 LAB — BASIC METABOLIC PANEL - CANCER CENTER ONLY
Anion gap: 8 (ref 5–15)
BUN: 27 mg/dL — ABNORMAL HIGH (ref 8–23)
CO2: 28 mmol/L (ref 22–32)
Calcium: 9 mg/dL (ref 8.9–10.3)
Chloride: 103 mmol/L (ref 98–111)
Creatinine: 1.31 mg/dL — ABNORMAL HIGH (ref 0.61–1.24)
GFR, Estimated: 53 mL/min — ABNORMAL LOW (ref >60)
Glucose, Bld: 129 mg/dL — ABNORMAL HIGH (ref 70–99)
Potassium: 4.2 mmol/L (ref 3.5–5.1)
Sodium: 139 mmol/L (ref 135–145)

## 2022-07-06 ENCOUNTER — Telehealth: Payer: Self-pay

## 2022-07-06 DIAGNOSIS — N289 Disorder of kidney and ureter, unspecified: Secondary | ICD-10-CM

## 2022-07-06 NOTE — Telephone Encounter (Signed)
-----   Message from Rickard Patience, MD sent at 07/03/2022  5:12 PM EDT ----- Please let him know that his kidney function is worse.  Encourage hydration.  Arrange him to get US renal Ask pt to see PCP for further management.

## 2022-07-06 NOTE — Telephone Encounter (Signed)
Called and informed pt of MD recommendation for US renal. Will fax results to PCP Delphia Grates).

## 2022-07-07 NOTE — Telephone Encounter (Signed)
Spoke to pt and informed of appt details. Mail reminder to be sent as well.

## 2022-07-08 ENCOUNTER — Ambulatory Visit: Payer: Medicare Other

## 2022-07-23 ENCOUNTER — Ambulatory Visit: Admission: RE | Admit: 2022-07-23 | Payer: Medicare Other | Source: Ambulatory Visit

## 2022-07-29 ENCOUNTER — Emergency Department: Payer: Medicare Other

## 2022-07-29 ENCOUNTER — Emergency Department
Admission: EM | Admit: 2022-07-29 | Discharge: 2022-07-30 | Disposition: A | Discharge status: Home / Self Care | Payer: Medicare Other | Attending: Emergency Medicine | Admitting: Emergency Medicine

## 2022-07-29 ENCOUNTER — Other Ambulatory Visit: Payer: Self-pay

## 2022-07-29 DIAGNOSIS — W19XXXA Unspecified fall, initial encounter: Secondary | ICD-10-CM

## 2022-07-29 DIAGNOSIS — W01198A Fall on same level from slipping, tripping and stumbling with subsequent striking against other object, initial encounter: Secondary | ICD-10-CM | POA: Insufficient documentation

## 2022-07-29 DIAGNOSIS — F0390 Unspecified dementia without behavioral disturbance: Secondary | ICD-10-CM | POA: Insufficient documentation

## 2022-07-29 DIAGNOSIS — S065X0A Traumatic subdural hemorrhage without loss of consciousness, initial encounter: Secondary | ICD-10-CM | POA: Diagnosis not present

## 2022-07-29 DIAGNOSIS — Z96649 Presence of unspecified artificial hip joint: Secondary | ICD-10-CM | POA: Insufficient documentation

## 2022-07-29 DIAGNOSIS — Y92129 Unspecified place in nursing home as the place of occurrence of the external cause: Secondary | ICD-10-CM | POA: Insufficient documentation

## 2022-07-29 DIAGNOSIS — S065XAA Traumatic subdural hemorrhage with loss of consciousness status unknown, initial encounter: Secondary | ICD-10-CM

## 2022-07-29 DIAGNOSIS — S0990XA Unspecified injury of head, initial encounter: Secondary | ICD-10-CM | POA: Diagnosis present

## 2022-07-29 LAB — BASIC METABOLIC PANEL
Anion gap: 9 (ref 5–15)
BUN: 25 mg/dL — ABNORMAL HIGH (ref 8–23)
CO2: 28 mmol/L (ref 22–32)
Calcium: 11.5 mg/dL — ABNORMAL HIGH (ref 8.9–10.3)
Chloride: 103 mmol/L (ref 98–111)
Creatinine, Ser: 1.51 mg/dL — ABNORMAL HIGH (ref 0.61–1.24)
GFR, Estimated: 44 mL/min — ABNORMAL LOW (ref >60)
Glucose, Bld: 123 mg/dL — ABNORMAL HIGH (ref 70–99)
Potassium: 3.6 mmol/L (ref 3.5–5.1)
Sodium: 140 mmol/L (ref 135–145)

## 2022-07-29 LAB — CBC
HCT: 37.9 % — ABNORMAL LOW (ref 39.0–52.0)
Hemoglobin: 12.9 g/dL — ABNORMAL LOW (ref 13.0–17.0)
MCH: 33.3 pg (ref 26.0–34.0)
MCHC: 34 g/dL (ref 30.0–36.0)
MCV: 97.9 fL (ref 80.0–100.0)
Platelets: 184 K/uL (ref 150–400)
RBC: 3.87 MIL/uL — ABNORMAL LOW (ref 4.22–5.81)
RDW: 12.5 % (ref 11.5–15.5)
WBC: 6.3 K/uL (ref 4.0–10.5)
nRBC: 0 % (ref 0.0–0.2)

## 2022-07-29 LAB — URINALYSIS, ROUTINE W REFLEX MICROSCOPIC
Bacteria, UA: NONE SEEN
Bilirubin Urine: NEGATIVE
Glucose, UA: NEGATIVE mg/dL
Ketones, ur: NEGATIVE mg/dL
Leukocytes,Ua: NEGATIVE
Nitrite: NEGATIVE
Protein, ur: NEGATIVE mg/dL
Specific Gravity, Urine: 1.005 (ref 1.005–1.030)
Squamous Epithelial / HPF: NONE SEEN /HPF (ref 0–5)
pH: 7 (ref 5.0–8.0)

## 2022-07-29 NOTE — ED Notes (Signed)
Pt cleaned of urine, new linen placed on bed pt cleaned and placed in gown, call bell given to pt and instructed on how to use it. Fall socks placed on pt. While cleaning pt bruising and abrasions noted to left chest and left upper arm, pt states he is unsure on how he got them.

## 2022-07-29 NOTE — ED Triage Notes (Signed)
Pt to ED [facility] from [facility] for syncopal episode. +hit head. Unknown blood thinner use.  Reports multiple falls recently. Bruising noted to chest.

## 2022-07-29 NOTE — Discharge Instructions (Signed)
[name] was seen in the emergency department following a fall.  He had a CT scan done of his head that showed a small subdural hematoma.  He had a repeat CT scan done of his head that showed a stable subdural hematoma.  He can follow-up closely with his primary care physician.  Return to the emergency department for any worsening symptoms.

## 2022-07-29 NOTE — ED Notes (Signed)
Pt given water at this time 

## 2022-07-29 NOTE — ED Notes (Signed)
Pt given meal tray at this time 

## 2022-07-29 NOTE — ED Notes (Signed)
Facility called to get med list, no answer at this time

## 2022-07-29 NOTE — Consult Note (Signed)
Consult requested by:  Dr. Arnoldo Morale  Consult requested for:  Subdural hematoma  Primary Physician:  [facility]  History of Present Illness: 07/29/2022 [name] is here today with a chief complaint of fall.  He has no headache, nausea, or vomiting.  He is at his baseline state of health.  He lives in an assisted living facility.  A subdural hematoma was identified and I was consulted for management recommendations.  Review of Systems:  A 10 point review of systems is negative, except for the pertinent positives and negatives detailed in the HPI.  Past Medical History: Past Medical History:  Diagnosis Date   A-fib ([facility]CC)    Back pain    Cor pulmonale (chronic) ([facility]CC)    DVT (deep venous thrombosis) ([facility])    Gait abnormality 05/23/2018   Glaucoma    Hypertension    Insomnia    Pulmonary embolism, bilateral ([facility]) 07/2010   on coumadin   Skin cancer    forehead   Skin cancer    Vertigo     Past Surgical History: Past Surgical History:  Procedure Laterality Date   BACK SURGERY     x3   EXCISION MASS NECK Right 01/19/2018   Procedure: Wide local excision of right neck skin cancer (5 cm);  Surgeon: Christia Reading, MD;  Location: [facility] OR;  Service: ENT;  Laterality: Right;   FOOT SURGERY     RADICAL NECK DISSECTION Right 01/19/2018    Right modified radical neck dissection; Cervicopectoral flap (Right Neck)   RADICAL NECK DISSECTION Right 01/19/2018   Procedure: Right modified radical neck dissection; Cervicopectoral flap;  Surgeon: Christia Reading, MD;  Location: [facility] OR;  Service: ENT;  Laterality: Right;   SHOULDER SURGERY     x3  Dr. Odette Fraction al.   TOTAL HIP ARTHROPLASTY  2012   Dr. Charlann Boxer right hip   TRANSTHORACIC ECHOCARDIOGRAM  07/26/2010   Left ventricle: The cavity size was normal. Wall thickness was increased in a pattern of moderate LVH. Systolic function was normal. The estimated ejection fraction was in the range of 55% to 60%     Allergies: Allergies as of 07/29/2022 - Review Complete 07/29/2022  Allergen Reaction Noted   Codeine Nausea And Vomiting 07/25/2010   Morphine and codeine Nausea And Vomiting 08/12/2010    Medications: No outpatient medications have been marked as taking for the 07/29/22 encounter [facility] Encounter).    Social History: Social History   Tobacco Use   Smoking status: Former    Packs/day: 1.00    Years: 25.00    Additional pack years: 0.00    Total pack years: 25.00    Types: Cigarettes    Quit date: 03/02/1981    Years since quitting: 41.4   Smokeless tobacco: Former    Types: Chew    Quit date: 03/02/2008  Vaping Use   Vaping Use: Never used  Substance Use Topics   Alcohol use: Yes    Comment: wine 3-4 times a week   Drug use: No    Family Medical History: Family History  Problem Relation Age of Onset   Alzheimer's disease Mother    Stroke Father    Lung cancer Brother    Cancer Son     Physical Examination: Vitals:   07/29/22 1814 07/29/22 1932  BP: (!) 182/91 (!) 190/91  Pulse: 77 62  Resp: 15 16  Temp:    SpO2: 95% 99%    General: Patient is in no apparent  distress. Attention to examination is appropriate.  Neck:   Supple.  Full range of motion.  Respiratory: Patient is breathing without any difficulty.   NEUROLOGICAL:     Awake, alert, oriented to person, place, and time.  Speech is clear and fluent.  Cranial Nerves: Pupils equal round and reactive to light.  Facial tone is symmetric.  Facial sensation is symmetric. Shoulder shrug is symmetric. Tongue protrusion is midline.  There is no pronator drift.  Strength: 5/5 throughout   Hoffman's is absent.   Bilateral upper and lower extremity sensation is intact to light touch.    No evidence of dysmetria noted.  Gait is untested.     Medical Decision Making  Imaging: CT Head 07/29/2022 IMPRESSION: 1. 5 mm subdural hematoma along the left cerebral convexity with 4 mm rightward  midline shift. 2. Mild soft tissue swelling along the left occipital scalp. No underlying calvarial fracture.     Electronically Signed   By: Lorenza Cambridge M.D.   On: 07/29/2022 18:22  I have personally reviewed the images and agree with the above interpretation.  Assessment and Plan: [name] is a pleasant 89 y.o. male with baseline mental status with GCS score 15.  He has a small left-sided acute subdural hematoma.  He is not on anticoagulation.  I recommended repeating his CT scan in 6 hours.  If that is stable, I would recommend discharge back to his prior living arrangement.  I would recommend against antiepileptic prophylaxis given the risk for polypharmacy.  Will arrange follow-up with CT scan in approximately 3 to 4 weeks.    I have communicated my recommendations to the requesting physician and coordinated care to facilitate these recommendations.     Rambo Sarafian K. Myer Haff MD, [facility] Neurosurgery

## 2022-07-29 NOTE — ED Provider Notes (Signed)
[facility] Provider Note    Event Date/Time   First MD Initiated Contact with Patient 07/29/22 1800     (approximate)   History   Loss of Consciousness   HPI  [name] is a 89 y.o. male past medical history significant for dementia, history of pulmonary embolism that was provoked by hip replacement not on anticoagulation given fall risk, history of atrial fibrillation, who presents to the emergency department following an unwitnessed fall from nursing facility.  Did have head injury.  Patient without complaints at this time.  Altered mental status but at his baseline according to EMS.       Physical Exam   Triage Vital Signs: ED Triage Vitals  Enc Vitals Group     BP 07/29/22 1745 (!) 176/101     Pulse Rate 07/29/22 1744 67     Resp 07/29/22 1744 18     Temp 07/29/22 1744 98.4 F (36.9 C)     Temp src --      SpO2 07/29/22 1745 93 %     Weight 07/29/22 1745 200 lb (90.7 kg)     Height 07/29/22 1745 5\' 10"  (1.778 m)     Head Circumference --      Peak Flow --      Pain Score 07/29/22 1745 0     Pain Loc --      Pain Edu? --      Excl. in GC? --     Most recent vital signs: Vitals:   07/29/22 2130 07/29/22 2226  BP: (!) 191/81 (!) 167/92  Pulse: 62 90  Resp: 19 10  Temp:    SpO2: 96% 97%    Physical Exam Constitutional:      Appearance: He is well-developed.  Eyes:     Conjunctiva/sclera: Conjunctivae normal.     Pupils: Pupils are equal, round, and reactive to light.  Cardiovascular:     Rate and Rhythm: Regular rhythm.  Pulmonary:     Effort: No respiratory distress.  Musculoskeletal:     Cervical back: Normal range of motion.  Skin:    General: Skin is warm.  Neurological:     Mental Status: He is alert. Mental status is at baseline.     GCS: GCS eye subscore is 4. GCS verbal subscore is 4. GCS motor subscore is 6.     Cranial Nerves: Cranial nerves 2-12 are intact.     Sensory: Sensation is intact.     Motor: Motor  function is intact.     Coordination: Coordination is intact.     IMPRESSION / MDM / ASSESSMENT AND PLAN / ED COURSE  I reviewed the triage vital signs and the nursing notes.  Differential diagnosis including intracranial hemorrhage, dysrhythmia, anemia, dehydration, urinary tract infection  EKG  I, Corena Herter, the attending physician, personally viewed and interpreted this ECG.  Right bundle branch block, no significant ST changes.  Decreased rate when compared to prior EKG.  Normal QTc.  No signs of acute ischemia or dysrhythmia.  No tachycardic or bradycardic dysrhythmias while on cardiac telemetry.  RADIOLOGY I independently reviewed imaging, my interpretation of imaging: CT scan of the head with small subdural.  Read as 5 mm subdural with 4 mm rightward shift.  Soft tissue swelling to the left occipital scalp.  LABS (all labs ordered are listed, but only abnormal results are displayed) Labs interpreted as -    Labs Reviewed  BASIC METABOLIC PANEL - Abnormal; Notable  for the following components:      Result Value   Glucose, Bld 123 (*)    BUN 25 (*)    Creatinine, Ser 1.51 (*)    Calcium 11.5 (*)    GFR, Estimated 44 (*)    All other components within normal limits  CBC - Abnormal; Notable for the following components:   RBC 3.87 (*)    Hemoglobin 12.9 (*)    HCT 37.9 (*)    All other components within normal limits  URINALYSIS, ROUTINE W REFLEX MICROSCOPIC - Abnormal; Notable for the following components:   Color, Urine STRAW (*)    APPearance CLEAR (*)    Hgb urine dipstick SMALL (*)    All other components within normal limits     MDM  Called and discussed the patient's case with neurosurgery Dr. Marcell Barlow, recommended repeat CT scan done 6 hours after initial CT scan of the head.  On chart review patient is not on any anticoagulation.  Attempted to call the nursing facility multiple times to confirm however no one is answering.  Clinical picture  consistent with subdural hematoma.  Blood pressure initially elevated but improved on its own without any intervention.  If stable CT scan on repeat will discharge back to facility.     PROCEDURES:  Critical Care performed: yes  .Critical Care  Performed by: Corena Herter, MD Authorized by: Corena Herter, MD   Critical care provider statement:    Critical care time (minutes):  30   Critical care time was exclusive of:  Separately billable procedures and treating other patients   Critical care was necessary to treat or prevent imminent or life-threatening deterioration of the following conditions:  CNS failure or compromise   Critical care was time spent personally by me on the following activities:  Development of treatment plan with patient or surrogate, discussions with consultants, evaluation of patient's response to treatment, examination of patient, ordering and review of laboratory studies, ordering and review of radiographic studies, ordering and performing treatments and interventions, pulse oximetry, re-evaluation of patient's condition and review of old charts   Patient's presentation is most consistent with acute presentation with potential threat to life or bodily function.   MEDICATIONS ORDERED IN ED: Medications - No data to display  FINAL CLINICAL IMPRESSION(S) / ED DIAGNOSES   Final diagnoses:  SDH (subdural hematoma) (HCC)  Fall, initial encounter     Rx / DC Orders   ED Discharge Orders     None        Note:  This document was prepared using Dragon voice recognition software and may include unintentional dictation errors.   Corena Herter, MD 07/29/22 2322

## 2022-07-29 NOTE — ED Notes (Signed)
No change in neuro assessment at this time, pt used urinal in bed. Denies other needs.

## 2022-07-29 NOTE — ED Notes (Signed)
Pt to CT at this time.

## 2022-07-30 ENCOUNTER — Ambulatory Visit: Payer: Medicare Other

## 2022-07-30 NOTE — ED Notes (Signed)
Pt given warm blanket.

## 2022-07-30 NOTE — ED Provider Notes (Signed)
-----------------------------------------   12:35 AM on 07/30/2022 -----------------------------------------   CT head interpreted per Dr. Raynelle Dick:  1. Stable 5 mm left frontotemporal convexity subdural bleed of  intermediate density, with stable 4 mm of left-to-right midline  shift.  2. No new or worsening findings.  3. Chronic changes.  4. Query atrophic ventriculomegaly versus normal pressure  hydrocephalus, also unchanged.   Updated patient of stable CT head.  Patient is stable for discharge back to facility.  Strict return precautions given.  Patient verbalizes understanding and agrees with plan of care.   Irean Hong, MD 07/30/22 [phone]

## 2022-07-31 ENCOUNTER — Telehealth: Payer: Self-pay

## 2022-07-31 DIAGNOSIS — S065XAA Traumatic subdural hemorrhage with loss of consciousness status unknown, initial encounter: Secondary | ICD-10-CM

## 2022-07-31 NOTE — Telephone Encounter (Signed)
Message has been sent to scheduling. 

## 2022-07-31 NOTE — Telephone Encounter (Signed)
No answer and unable to leave message because voice mail is full.

## 2022-07-31 NOTE — Telephone Encounter (Signed)
Dr Myer Haff saw him as a consult on 07/29/22 for a subdural hematoma. Per Dr Myer Haff, he needs a follow up appointment in 4 weeks with a repeat head CT prior to the appointment (around 08/26/22). CT has been ordered.

## 2022-07-31 NOTE — Telephone Encounter (Signed)
Venetia Night, MD  Sharlot Gowda, RN F/u in 4 weeks with CT

## 2022-08-01 ENCOUNTER — Emergency Department
Admission: EM | Admit: 2022-08-01 | Discharge: 2022-08-02 | Disposition: A | Discharge status: Home / Self Care | Payer: Medicare Other | Attending: Emergency Medicine | Admitting: Emergency Medicine

## 2022-08-01 ENCOUNTER — Other Ambulatory Visit: Payer: Self-pay

## 2022-08-01 ENCOUNTER — Emergency Department: Payer: Medicare Other

## 2022-08-01 DIAGNOSIS — R410 Disorientation, unspecified: Secondary | ICD-10-CM | POA: Insufficient documentation

## 2022-08-01 DIAGNOSIS — I10 Essential (primary) hypertension: Secondary | ICD-10-CM | POA: Diagnosis not present

## 2022-08-01 DIAGNOSIS — R42 Dizziness and giddiness: Secondary | ICD-10-CM | POA: Diagnosis present

## 2022-08-01 DIAGNOSIS — W19XXXA Unspecified fall, initial encounter: Secondary | ICD-10-CM

## 2022-08-01 DIAGNOSIS — Z8679 Personal history of other diseases of the circulatory system: Secondary | ICD-10-CM | POA: Diagnosis not present

## 2022-08-01 LAB — BASIC METABOLIC PANEL
Anion gap: 14 (ref 5–15)
BUN: 29 mg/dL — ABNORMAL HIGH (ref 8–23)
CO2: 26 mmol/L (ref 22–32)
Calcium: 9.9 mg/dL (ref 8.9–10.3)
Chloride: 103 mmol/L (ref 98–111)
Creatinine, Ser: 1.2 mg/dL (ref 0.61–1.24)
GFR, Estimated: 59 mL/min — ABNORMAL LOW (ref >60)
Glucose, Bld: 100 mg/dL — ABNORMAL HIGH (ref 70–99)
Potassium: 3.1 mmol/L — ABNORMAL LOW (ref 3.5–5.1)
Sodium: 143 mmol/L (ref 135–145)

## 2022-08-01 LAB — HEPATIC FUNCTION PANEL
ALT: 27 U/L (ref 0–44)
AST: 56 U/L — ABNORMAL HIGH (ref 15–41)
Albumin: 4 g/dL (ref 3.5–5.0)
Alkaline Phosphatase: 67 U/L (ref 38–126)
Bilirubin, Direct: 0.2 mg/dL (ref 0.0–0.2)
Indirect Bilirubin: 1.4 mg/dL — ABNORMAL HIGH (ref 0.3–0.9)
Total Bilirubin: 1.6 mg/dL — ABNORMAL HIGH (ref 0.3–1.2)
Total Protein: 6.8 g/dL (ref 6.5–8.1)

## 2022-08-01 LAB — URINALYSIS, ROUTINE W REFLEX MICROSCOPIC
Bacteria, UA: NONE SEEN
Bilirubin Urine: NEGATIVE
Glucose, UA: NEGATIVE mg/dL
Ketones, ur: 20 mg/dL — AB
Leukocytes,Ua: NEGATIVE
Nitrite: NEGATIVE
Protein, ur: 30 mg/dL — AB
Specific Gravity, Urine: 1.018 (ref 1.005–1.030)
pH: 5 (ref 5.0–8.0)

## 2022-08-01 LAB — CBC
HCT: 37.3 % — ABNORMAL LOW (ref 39.0–52.0)
Hemoglobin: 12.5 g/dL — ABNORMAL LOW (ref 13.0–17.0)
MCH: 32.6 pg (ref 26.0–34.0)
MCHC: 33.5 g/dL (ref 30.0–36.0)
MCV: 97.1 fL (ref 80.0–100.0)
Platelets: 212 10*3/uL (ref 150–400)
RBC: 3.84 MIL/uL — ABNORMAL LOW (ref 4.22–5.81)
RDW: 12.4 % (ref 11.5–15.5)
WBC: 9.2 10*3/uL (ref 4.0–10.5)
nRBC: 0 % (ref 0.0–0.2)

## 2022-08-01 LAB — TROPONIN I (HIGH SENSITIVITY): Troponin I (High Sensitivity): 66 ng/L — ABNORMAL HIGH (ref <18)

## 2022-08-01 LAB — CK: Total CK: 923 U/L — ABNORMAL HIGH (ref 49–397)

## 2022-08-01 MED ORDER — LEVETIRACETAM 500 MG PO TABS
500.0000 mg | ORAL_TABLET | Freq: Once | ORAL | Status: AC
  Administered 2022-08-01: 500 mg via ORAL
  Filled 2022-08-01: qty 1

## 2022-08-01 MED ORDER — POTASSIUM CHLORIDE CRYS ER 20 MEQ PO TBCR
40.0000 meq | EXTENDED_RELEASE_TABLET | Freq: Once | ORAL | Status: AC
  Administered 2022-08-01: 40 meq via ORAL
  Filled 2022-08-01: qty 2

## 2022-08-01 MED ORDER — LEVETIRACETAM 500 MG PO TABS: 500.0000 mg | ORAL_TABLET | Freq: Two times a day (BID) | ORAL | 0 refills | Status: AC

## 2022-08-01 NOTE — ED Notes (Signed)
Tried to call [facility] re: transportation back home, no answer/office closed. Tried to call numbers listed in chart, no answer. Will do EMS med necessity.

## 2022-08-01 NOTE — ED Triage Notes (Signed)
Pt to ED from [facility] independent living [facility] states that pt was "found on floor" by staff and was "incoherent per staff. Pt states that he did not fall on floor, but let himself onto floor. Pt denies pain, SOB, urinary symptoms. Endorses dizziness "since 3 years". Pt is alert and oriented but somewhat slow to respond.  Per EMS, pt was oriented X2 upon arrival but then oriented X4 on way here and also had a subdural bleed last week which pt was not sure about either (and is not noted in medical hx). Pt was VAN (-) on EMS stroke scale, is VAN (-) here with no weakness or arm drift noted. Face symmetrical.  EMS VSS.

## 2022-08-01 NOTE — ED Provider Notes (Signed)
Emergency department handoff note  Care of this patient was signed out to me at the end of the previous provider shift.  All pertinent patient information was conveyed and all questions were answered.  Patient pending repeat troponin and urinalysis.  Patient's urinalysis not show any evidence of acute abnormalities.  Patient got first dose of Keppra here in the emergency department. The patient has been reexamined and is ready to be discharged.  All diagnostic results have been reviewed and discussed with the patient/family.  Care plan has been outlined and the patient/family understands all current diagnoses, results, and treatment plans.  There are no new complaints, changes, or physical findings at this time.  All questions have been addressed and answered.  All medications, if any, that were given while in the emergency department or any that are being prescribed have been reviewed with the patient/family.  All side effects and adverse reactions have been explained.  Patient was instructed to, and agrees to follow-up with their primary care physician as well as return to the emergency department if any new or worsening symptoms develop.   Merwyn Katos, MD 08/01/22 [phone]

## 2022-08-01 NOTE — ED Notes (Signed)
EDP at bedside  

## 2022-08-01 NOTE — ED Notes (Signed)
Tried to call facility again. No answer.

## 2022-08-01 NOTE — ED Provider Notes (Signed)
[facility] Provider Note    Event Date/Time   First MD Initiated Contact with Patient 08/01/22 1410     (approximate)   History   Fall   HPI  [name] is a 89 y.o. male  here with fall. Pt has a h/o AFib not on anticoagulation, HTN, PE, and was just here 5/29 for SDH from fall which was stable. Since then, pt has reportedly been doing well. However, he was found on the ground today by staff. Pt was reportedly initially confused per report but now is back to his baseline. He says he felt dizzy and lowered himself to the ground - he says this has been going on "for years" and is not acutely worse. He is adamant he did not fall and states he otherwise feels "just fine." Denies any focal numbness or weakness. Denies any vision changes.      Physical Exam   Triage Vital Signs: ED Triage Vitals  Enc Vitals Group     BP 08/01/22 1415 (!) 204/90     Pulse Rate 08/01/22 1415 62     Resp 08/01/22 1415 20     Temp 08/01/22 1415 97.9 F (36.6 C)     Temp Source 08/01/22 1415 Oral     SpO2 08/01/22 1415 99 %     Weight 08/01/22 1416 180 lb (81.6 kg)     Height 08/01/22 1416 5\' 10"  (1.778 m)     Head Circumference --      Peak Flow --      Pain Score 08/01/22 1416 0     Pain Loc --      Pain Edu? --      Excl. in GC? --     Most recent vital signs: Vitals:   08/01/22 1700 08/01/22 1730  BP: (!) 195/84 (!) 181/84  Pulse: (!) 58 (!) 57  Resp: 19 15  Temp:    SpO2: 97% 96%     General: Awake, no distress.  CV:  Good peripheral perfusion. RRR. Resp:  Normal work of breathing. Lungs clear bilaterally. Abd:  No distention. No tenderness. Other:  CNII-XII intact. Oriented to person, place but not time. Strength 5/5 bl UE and LE. Normal sensation to light touch. No significant head trauma noted. Neck supple and non-tender.   ED Results / Procedures / Treatments   Labs (all labs ordered are listed, but only abnormal results are displayed) Labs  Reviewed  BASIC METABOLIC PANEL - Abnormal; Notable for the following components:      Result Value   Potassium 3.1 (*)    Glucose, Bld 100 (*)    BUN 29 (*)    GFR, Estimated 59 (*)    All other components within normal limits  CBC - Abnormal; Notable for the following components:   RBC 3.84 (*)    Hemoglobin 12.5 (*)    HCT 37.3 (*)    All other components within normal limits  HEPATIC FUNCTION PANEL - Abnormal; Notable for the following components:   AST 56 (*)    Total Bilirubin 1.6 (*)    Indirect Bilirubin 1.4 (*)    All other components within normal limits  CK - Abnormal; Notable for the following components:   Total CK 923 (*)    All other components within normal limits  URINALYSIS, ROUTINE W REFLEX MICROSCOPIC - Abnormal; Notable for the following components:   Color, Urine YELLOW (*)    APPearance HAZY (*)  Hgb urine dipstick MODERATE (*)    Ketones, ur 20 (*)    Protein, ur 30 (*)    All other components within normal limits  TROPONIN I (HIGH SENSITIVITY) - Abnormal; Notable for the following components:   Troponin I (High Sensitivity) 66 (*)    All other components within normal limits     EKG Normal sinus rhythm, VR 63. PR 43, QRS 171, QTc 493. No acute st elevations or depresisons. No ischemia or infarct.   RADIOLOGY CT Head: Pending CXR: Pending   I also independently reviewed and agree with radiologist interpretations.   PROCEDURES:  Critical Care performed: No   MEDICATIONS ORDERED IN ED: Medications  potassium chloride SA (KLOR-CON M) CR tablet 40 mEq (40 mEq Oral Given 08/01/22 1506)  levETIRAcetam (KEPPRA) tablet 500 mg (500 mg Oral Given 08/01/22 1523)     IMPRESSION / MDM / ASSESSMENT AND PLAN / ED COURSE  I reviewed the triage vital signs and the nursing notes.                              Differential diagnosis includes, but is not limited to, acute encephalopathy 2/2 worsening SDH, new ICH, occult infection (UTI, PNA), metabolic  encephalopathy, polypharmacy, delirium  Patient's presentation is most consistent with acute presentation with potential threat to life or bodily function.  The patient is on the cardiac monitor to evaluate for evidence of arrhythmia and/or significant heart rate changes  89 yo M with h/o AFib not on anticoagulation, HTN, PE, here with question of fall and AMS though he is back to baseline now per report from EMS. Pt tells me he lowered himself down. Will repeat CT Head, f/u CXR and UA. He is o/w very well appearing. Labs reviewed,  CBC shows no leukocytosis and hgb is at baseline. BMP unremarkable, mild hypoK which was repleted. Trop minimally elevated btu he denies any chest pain and EKG Is nonischemic, and he has ah/o chronic elevation. Doubt acs.  Will fu imaging, UA, and confirm safe living situation given his baseline mild confusion. Of note, discussed case with Dr. Myer Haff re: his recent SDH and possible risk of seizure. Will start on ppx Keppra 500 BID in the event of possible unwitnessed seizure.   FINAL CLINICAL IMPRESSION(S) / ED DIAGNOSES   Final diagnoses:  Fall, initial encounter  Confusion  History of subdural hemorrhage     Rx / DC Orders   ED Discharge Orders          Ordered    levETIRAcetam (KEPPRA) 500 MG tablet  2 times daily        08/01/22 1510             Note:  This document was prepared using Dragon voice recognition software and may include unintentional dictation errors.   Shaune Pollack, MD 08/01/22 2005

## 2022-08-01 NOTE — ED Notes (Signed)
This RN attempted to call [facility] to arrange transport for pt back to facility. No answer.

## 2022-08-01 NOTE — ED Notes (Signed)
EMS arrived and refused to take pt back because he can walk with walker. This RN already tried to call [facility]. Will try again but there is no answer. CN was made aware by EMS.

## 2022-08-02 NOTE — ED Notes (Signed)
Spoke to employee at [facility] about [name] coming back to [facility] her name was Colletta Maryland she will be there to let patient in

## 2022-08-03 NOTE — Telephone Encounter (Signed)
No answer and unable to leave message at this time.

## 2022-08-05 NOTE — Telephone Encounter (Signed)
Unable to leave message, mail box full. Other number is to [facility]. They told me to call the patient directly.

## 2022-08-07 NOTE — Telephone Encounter (Signed)
Unable to leave message,mailbox full. 

## 2022-08-10 NOTE — Telephone Encounter (Signed)
Letter mailed out to patient's address to call the office.

## 2022-09-01 NOTE — Telephone Encounter (Signed)
He finally called back and scheduled his CT for 7/9, can you do a telephone visit at 4pm or in-person visit at 4pm the week after 7/9?

## 2022-09-07 NOTE — Telephone Encounter (Signed)
Phone visit scheduled for 09/17/2022 at 4pm, patient aware

## 2022-09-08 ENCOUNTER — Ambulatory Visit
Admission: RE | Admit: 2022-09-08 | Discharge: 2022-09-08 | Disposition: A | Discharge status: Home / Self Care | Payer: Medicare Other | Source: Ambulatory Visit | Attending: Neurosurgery | Admitting: Neurosurgery

## 2022-09-08 DIAGNOSIS — S065XAA Traumatic subdural hemorrhage with loss of consciousness status unknown, initial encounter: Secondary | ICD-10-CM | POA: Diagnosis present

## 2022-09-17 ENCOUNTER — Ambulatory Visit (INDEPENDENT_AMBULATORY_CARE_PROVIDER_SITE_OTHER): Payer: Medicare Other | Admitting: Neurosurgery

## 2022-09-17 DIAGNOSIS — S065XAD Traumatic subdural hemorrhage with loss of consciousness status unknown, subsequent encounter: Secondary | ICD-10-CM

## 2022-09-17 DIAGNOSIS — S065XAA Traumatic subdural hemorrhage with loss of consciousness status unknown, initial encounter: Secondary | ICD-10-CM

## 2022-09-17 NOTE — Progress Notes (Signed)
Back I called to speak with [name].  He is doing well.  His CT scan shows complete resolution of his hemorrhage.  I do not think he needs any further follow-up imaging.  Will see him back on an as-needed basis.  He is cleared to resume all activities and medications.  This visit was performed via telephone.  Patient location: home Provider location: office  I spent a total of 4 minutes non-face-to-face activities for this visit on the date of this encounter including review of current clinical condition and response to treatment.  The patient is aware of and accepts the limits of this telehealth visit.

## 2022-10-12 ENCOUNTER — Ambulatory Visit: Payer: Medicare Other | Admitting: Oncology

## 2022-10-13 ENCOUNTER — Inpatient Hospital Stay: Payer: Medicare Other | Admitting: Oncology

## 2022-10-27 ENCOUNTER — Encounter: Payer: Self-pay | Admitting: Oncology

## 2022-10-27 ENCOUNTER — Inpatient Hospital Stay: Payer: Medicare Other | Attending: Oncology | Admitting: Oncology

## 2022-10-27 VITALS — BP 142/77 | HR 72 | Temp 97.2°F | Resp 18 | Wt 203.4 lb

## 2022-10-27 DIAGNOSIS — Z86718 Personal history of other venous thrombosis and embolism: Secondary | ICD-10-CM | POA: Insufficient documentation

## 2022-10-27 DIAGNOSIS — G08 Intracranial and intraspinal phlebitis and thrombophlebitis: Secondary | ICD-10-CM

## 2022-10-27 DIAGNOSIS — Z9181 History of falling: Secondary | ICD-10-CM | POA: Diagnosis not present

## 2022-10-27 NOTE — Assessment & Plan Note (Addendum)
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  History of atrial fibrillation, not on anticoagulation due to fall risk.  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  He had MRI in August 2023 which showed dilation of the ventricular system out of proportion the degree of sulcal prominence.  hypercoagulable workup is negative,  I discussed with radiologist Dr.Grady. findings on Dec 2023 MRV may be chronic finding. 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  If there is thrombosis progression, consider anticoagulation with Eliquis.  Repeat brain MRV

## 2022-10-27 NOTE — Progress Notes (Signed)
Hematology/Oncology Consult note [phone] [phone]        REFERRING PROVIDER: [facility]   CHIEF COMPLAINTS/REASON FOR VISIT:  cerebral sinus thrombosis.    ASSESSMENT & PLAN:   Cerebral venous thrombosis Patient has remote history of bilateral PE provoked by hip replacement.  History of atrial fibrillation, not on anticoagulation due to fall risk.  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  He had MRI in August 2023 which showed dilation of the ventricular system out of proportion the degree of sulcal prominence.  hypercoagulable workup is negative,  I discussed with radiologist Dr.Grady. findings on Dec 2023 MRV may be chronic finding. 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  If there is thrombosis progression, consider anticoagulation with Eliquis.  Repeat brain MRV    Orders Placed This Encounter  Procedures   MR MRV HEAD W WO CONTRAST    Standing Status:   Future    Standing Expiration Date:   10/27/2023    Order Specific Question:   If indicated for the ordered procedure, I authorize the administration of contrast media per Radiology protocol    Answer:   Yes    Order Specific Question:   What is the patient's sedation requirement?    Answer:   No Sedation    Order Specific Question:   Does the patient have a pacemaker or implanted devices?    Answer:   No    Order Specific Question:   Preferred imaging location?    Answer:   [facility] (table limit - 550lbs)   Follow up in 2-3 months All questions were answered. The patient knows to call the clinic with any problems, questions or concerns.  Rickard Patience, MD, PhD [facility] Hematology Oncology 10/27/2022   HISTORY OF PRESENTING ILLNESS:   [name] is a  89 y.o.  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 Patient reports chronic dizziness for the past couple of years. He underwent workup patient today. 10/11/2021, MRI brain without contrast showed no acute intracranial pathology or significant change in the brain since 04/09/2019.  Unchanged dilation of the ventricular system out of proportion to the degree of sulcal prominence with crowding of the gyri at the left vertex  02/18/2021 MRI MRV head without contrast showed thrombosis of distal right transverse sinus and right sigmoid sinus.   He has a history of pulmonary embolism bilaterally in 2012 and the patient took Coumadin for 6 months and anticoagulation was stopped. Pulmonary embolism was felt to be provoked by his hip replacement In 2020 patient has neck invasive skin cancer status post right modified radical neck dissection, patient is status post adjuvant radiation as well.  Patient has a history of atrial fibrillation, CHADSVASCs score of 5 patient was seen in 2021 by cardiology Dr. Donato Schultz who felt that risk of bleeding outweighs benefits.  Patient takes aspirin 81 mg.  Patient has a history of falls.  He lives in a retirement home.  He does not have any live close family member Patient reports that he has had some weight loss.  Reports being forgetful lately. Patient cannot remember his previous medical history including pulmonary embolism, atrial fibrillation.  Extensive record review was performed.  Patient denies headache, seizure, vision changes, focal deficits.  He denies recent COVID-19  infection.   INTERVAL HISTORY [name] is a 89 y.o. male who has above history reviewed by me today presents for follow up visit for Thrombosis of the distal right transverse sinus and right sigmoid sinus He has chronic dizziness, history of frequent falls, subdural hematoma. He walks with a walker.   MEDICAL HISTORY:  Past Medical History:  Diagnosis Date   A-fib (HCC)    Back pain     Cor pulmonale (chronic) (HCC)    DVT (deep venous thrombosis) (HCC)    Gait abnormality 05/23/2018   Glaucoma    Hypertension    Insomnia    Pulmonary embolism, bilateral (HCC) 07/2010   on coumadin   Skin cancer    forehead   Skin cancer    Vertigo     SURGICAL HISTORY: Past Surgical History:  Procedure Laterality Date   BACK SURGERY     x3   EXCISION MASS NECK Right 01/19/2018   Procedure: Wide local excision of right neck skin cancer (5 cm);  Surgeon: Christia Reading, MD;  Location: [facility] OR;  Service: ENT;  Laterality: Right;   FOOT SURGERY     RADICAL NECK DISSECTION Right 01/19/2018    Right modified radical neck dissection; Cervicopectoral flap (Right Neck)   RADICAL NECK DISSECTION Right 01/19/2018   Procedure: Right modified radical neck dissection; Cervicopectoral flap;  Surgeon: Christia Reading, MD;  Location: [facility] OR;  Service: ENT;  Laterality: Right;   SHOULDER SURGERY     x3  Dr. Odette Fraction al.   TOTAL HIP ARTHROPLASTY  2012   Dr. Charlann Boxer right hip   TRANSTHORACIC ECHOCARDIOGRAM  07/26/2010   Left ventricle: The cavity size was normal. Wall thickness was increased in a pattern of moderate LVH. Systolic function was normal. The estimated ejection fraction was in the range of 55% to 60%    SOCIAL HISTORY: Social History   Socioeconomic History   Marital status: Widowed    Spouse name: Not on file   Number of children: 1   Years of education: Not on file   Highest education level: Some college, no degree  Occupational History   Not on file  Tobacco Use   Smoking status: Former    Current packs/day: 0.00    Average packs/day: 1 pack/day for 25.0 years (25.0 ttl pk-yrs)    Types: Cigarettes    Start date: 03/02/1956    Quit date: 03/02/1981    Years since quitting: 41.6   Smokeless tobacco: Former    Types: Chew    Quit date: 03/02/2008  Vaping Use   Vaping status: Never Used  Substance and Sexual Activity   Alcohol use: Yes    Comment: wine 3-4 times a week   Drug  use: No   Sexual activity: Not on file  Other Topics Concern   Not on file  Social History Narrative   Right handed    2 cups of caffeine per day    Lives at home alone    Social Determinants of Health   Financial Resource Strain: Not on file  Food Insecurity: Not on file  Transportation Needs: No Transportation Needs (02/16/2018)   PRAPARE - Administrator, Civil Service (Medical): No    Lack of Transportation (Non-Medical): No  Physical Activity: Not on file  Stress: Not on file  Social Connections: Not on file  Intimate Partner Violence: Not At Risk (02/16/2018)   Humiliation, Afraid, Rape, and Kick questionnaire    Fear of Current  or Ex-Partner: No    Emotionally Abused: No    Physically Abused: No    Sexually Abused: No    FAMILY HISTORY: Family History  Problem Relation Age of Onset   Alzheimer's disease Mother    Stroke Father    Lung cancer Brother    Cancer Son     ALLERGIES:  is allergic to codeine and morphine and codeine.  MEDICATIONS:  Current Outpatient Medications  Medication Sig Dispense Refill   Ascorbic Acid (VITAMIN C) 1000 MG tablet Take 1,000 mg by mouth daily.       aspirin EC 81 MG tablet Take 81 mg by mouth daily.     cholecalciferol (VITAMIN D3) 25 MCG (1000 UT) tablet Take 1,000 Units by mouth daily.      Copper Gluconate (COPPER CAPS) 2 MG CAPS Take 2 mg by mouth daily.      diazepam (VALIUM) 2 MG tablet Take 0.5 tablets (1 mg total) by mouth 2 (two) times daily as needed for anxiety. 10 tablet 0   diltiazem (CARDIZEM CD) 120 MG 24 hr capsule Take 1 capsule (120 mg total) by mouth daily. 30 capsule 0   diphenhydrAMINE (BENADRYL) 25 MG tablet Take 25 mg by mouth daily as needed (runny nose).      GLUCOSAMINE-CHONDROITIN PO Take 1 tablet by mouth daily.     latanoprost (XALATAN) 0.005 % ophthalmic solution Place 1 drop into both eyes at bedtime.     levETIRAcetam (KEPPRA) 500 MG tablet Take 1 tablet (500 mg total) by mouth 2 (two)  times daily for 7 days. 14 tablet 0   mirtazapine (REMERON) 30 MG tablet Take 30 mg by mouth at bedtime.     Red Yeast Rice 600 MG CAPS Take 1,200 mg by mouth daily.      sildenafil (VIAGRA) 25 MG tablet Take 25 mg by mouth daily as needed for erectile dysfunction.     traZODone (DESYREL) 100 MG tablet Take 100 mg by mouth at bedtime.     vitamin B-12 (CYANOCOBALAMIN) 1000 MCG tablet Take 1,000 mcg by mouth daily. (Patient not taking: Reported on 05/07/2022)     vitamin E 400 UNIT capsule Take 400 Units by mouth daily.  (Patient not taking: Reported on 05/07/2022)     No current facility-administered medications for this visit.    Review of Systems  Constitutional:  Positive for unexpected weight change. Negative for chills, fatigue and fever.  HENT:   Negative for hearing loss and voice change.   Eyes:  Negative for eye problems and icterus.  Respiratory:  Negative for chest tightness, cough and shortness of breath.   Cardiovascular:  Negative for chest pain and leg swelling.  Gastrointestinal:  Negative for abdominal distention and abdominal pain.  Endocrine: Negative for hot flashes.  Genitourinary:  Negative for difficulty urinating, dysuria and frequency.   Musculoskeletal:  Negative for arthralgias.  Skin:  Negative for itching and rash.  Neurological:  Negative for light-headedness and numbness.  Hematological:  Negative for adenopathy. Does not bruise/bleed easily.  Psychiatric/Behavioral:  Negative for confusion.    PHYSICAL EXAMINATION: ECOG PERFORMANCE STATUS: 1 - Symptomatic but completely ambulatory Vitals:   10/27/22 1510  BP: (!) 142/77  Pulse: 72  Resp: 18  Temp: (!) 97.2 F (36.2 C)  SpO2: 95%   Filed Weights   10/27/22 1510  Weight: 203 lb 6.4 oz (92.3 kg)    Physical Exam Constitutional:      General: He is not in acute distress.  Comments: Patient ambulates with a cane  HENT:     Head: Normocephalic and atraumatic.  Eyes:     General: No scleral  icterus. Cardiovascular:     Rate and Rhythm: Normal rate.     Heart sounds: Normal heart sounds.  Pulmonary:     Effort: Pulmonary effort is normal. No respiratory distress.     Breath sounds: No wheezing.  Abdominal:     General: Bowel sounds are normal. There is no distension.     Palpations: Abdomen is soft.  Musculoskeletal:        General: No deformity. Normal range of motion.     Cervical back: Normal range of motion and neck supple.  Skin:    General: Skin is warm and dry.  Neurological:     Mental Status: He is alert and oriented to person, place, and time. Mental status is at baseline.  Psychiatric:        Mood and Affect: Mood normal.     LABORATORY DATA:  I have reviewed the data as listed    Latest Ref Rng & Units 08/01/2022    2:10 PM 07/29/2022    5:38 PM 05/07/2022    3:48 PM  CBC  WBC 4.0 - 10.5 K/uL 9.2  6.3  5.5   Hemoglobin 13.0 - 17.0 g/dL 25.3  66.4  40.3   Hematocrit 39.0 - 52.0 % 37.3  37.9  39.3   Platelets 150 - 400 K/uL 212  184  203       Latest Ref Rng & Units 08/01/2022    2:10 PM 07/29/2022    5:38 PM 07/02/2022    2:01 PM  CMP  Glucose 70 - 99 mg/dL 474  259  563   BUN 8 - 23 mg/dL 29  25  27    Creatinine 0.61 - 1.24 mg/dL 8.75  6.43  3.29   Sodium 135 - 145 mmol/L 143  140  139   Potassium 3.5 - 5.1 mmol/L 3.1  3.6  4.2   Chloride 98 - 111 mmol/L 103  103  103   CO2 22 - 32 mmol/L 26  28  28    Calcium 8.9 - 10.3 mg/dL 9.9  51.8  9.0   Total Protein 6.5 - 8.1 g/dL 6.8     Total Bilirubin 0.3 - 1.2 mg/dL 1.6     Alkaline Phos 38 - 126 U/L 67     AST 15 - 41 U/L 56     ALT 0 - 44 U/L 27         RADIOGRAPHIC STUDIES: I have personally reviewed the radiological images as listed and agreed with the findings in the report. CT HEAD WO CONTRAST ( )  Result Date: 09/09/2022 CLINICAL DATA:  Subdural hematoma. EXAM: CT HEAD WITHOUT CONTRAST TECHNIQUE: Contiguous axial images were obtained from the base of the skull through the vertex without  intravenous contrast. RADIATION DOSE REDUCTION: This exam was performed according to the departmental dose-optimization program which includes automated exposure control, adjustment of the mA and/or kV according to patient size and/or use of iterative reconstruction technique. COMPARISON:  08/01/2022 FINDINGS: Brain: Resolution of previous left subdural hematoma. No new extra-axial collection. No acute intracranial hemorrhage. There is stable ventricular enlargement which is out of proportion to sulcal prominence. Periventricular chronic small vessel ischemic change and remote lacunar infarct of the right caudate again seen. No evidence of acute or interval ischemia. Vascular: Atherosclerosis of skullbase vasculature without hyperdense vessel or abnormal calcification. Skull:  No fracture or focal lesion. Sinuses/Orbits: No acute finding. Other: None. IMPRESSION: 1. Resolution of previous left subdural hematoma. No acute intracranial abnormality. 2. Stable ventricular enlargement heart upper portion of the sulcal prominence. This may be related to central atrophy, however the possibility of normal pressure hydrocephalus is raised. 3. Similar chronic small vessel ischemic change. Electronically Signed   By: Narda Rutherford M.D.   On: 09/09/2022 21:17   DG Chest 2 View  Result Date: 08/01/2022 CLINICAL DATA:  Confusion.  Weakness. EXAM: CHEST - 2 VIEW COMPARISON:  04/24/2019. FINDINGS: Cardiac silhouette is normal in size. No mediastinal or hilar masses. No evidence of adenopathy. Clear lungs.  No pleural effusion or pneumothorax. Skeletal structures are intact. IMPRESSION: No active cardiopulmonary disease. Electronically Signed   By: Amie Portland M.D.   On: 08/01/2022 15:14   CT HEAD WO CONTRAST ( )  Result Date: 08/01/2022 CLINICAL DATA:  Found on floor at nursing home. EXAM: CT HEAD WITHOUT CONTRAST TECHNIQUE: Contiguous axial images were obtained from the base of the skull through the vertex without  intravenous contrast. RADIATION DOSE REDUCTION: This exam was performed according to the departmental dose-optimization program which includes automated exposure control, adjustment of the mA and/or kV according to patient size and/or use of iterative reconstruction technique. COMPARISON:  07/30/2022. FINDINGS: Brain: No evidence of acute infarction, hydrocephalus, intra-axial or extra-axial mass or mass effect. Sliver of isoattenuating subdural hemorrhage noted over the left cerebral hemisphere, best seen on the coronal reformatted images, decreased in size and density compared to the exam from 07/30/2022. No evidence of new intracranial hemorrhage. Ventricular and sulcal enlargement reflecting moderate diffuse atrophy, stable. Small old lacunar infarct in the right caudate nucleus head, another in the right thalamus, also unchanged. Vascular: No hyperdense vessel or unexpected calcification. Skull: Normal. Negative for fracture or focal lesion. Sinuses/Orbits: Globes and orbits are unremarkable. Mild ethmoid and inferior frontal sinus mucosal thickening. Other: None. IMPRESSION: 1. No acute intracranial abnormalities. 2. Residual minimal left sided subdural hemorrhage, now isoattenuating, decreased in size compared to the CT from 07/30/2022 Electronically Signed   By: Amie Portland M.D.   On: 08/01/2022 15:12   CT Head Wo Contrast  Result Date: 07/30/2022 CLINICAL DATA:  Follow-up left frontal subdural bleed. Presented after head trauma yesterday. EXAM: CT HEAD WITHOUT CONTRAST TECHNIQUE: Contiguous axial images were obtained from the base of the skull through the vertex without intravenous contrast. RADIATION DOSE REDUCTION: This exam was performed according to the departmental dose-optimization program which includes automated exposure control, adjustment of the mA and/or kV according to patient size and/or use of iterative reconstruction technique. COMPARISON:  CT head yesterday at 6:07 p.m., MRI brain  10/01/2021 FINDINGS: Brain: Current study was completed at 12:01 a.m., 07/30/2022. Still seen is a left frontotemporal convexity subdural bleed of intermediate density measuring 5 mm in thickness, stable. Again no more than 4 mm of left-to-right midline shift is seen, also unchanged. There is no evidence of a new hemorrhage. No cortical based infarct is seen. There is mild small-vessel disease. There is mild-to-moderate cerebral and mild cerebellar atrophy, with chronic ventricular prominence disproportionate to the atrophy consistent with atrophic ventriculomegaly or normal pressure hydrocephalus. There has been no interval change in appearance. Chronic lacunar infarcts are again noted in the right caudate head and right thalamus. Vascular: Scattered calcific plaque in the distal vertebral arteries and siphons. No hyperdense central vessels. Skull: Negative for fractures or focal lesions. Mild swelling left occipital scalp again is seen. Sinuses/Orbits: No acute  finding. Clear sinuses and mastoids. Negative orbits apart from old lens replacements. Other: None. IMPRESSION: 1. Stable 5 mm left frontotemporal convexity subdural bleed of intermediate density, with stable 4 mm of left-to-right midline shift. 2. No new or worsening findings. 3. Chronic changes. 4. Query atrophic ventriculomegaly versus normal pressure hydrocephalus, also unchanged. Electronically Signed   By: Almira Bar M.D.   On: 07/30/2022 00:22

## 2022-12-25 ENCOUNTER — Emergency Department
Admission: EM | Admit: 2022-12-25 | Discharge: 2022-12-25 | Disposition: A | Discharge status: Home / Self Care | Payer: Medicare Other | Attending: Emergency Medicine | Admitting: Emergency Medicine

## 2022-12-25 ENCOUNTER — Other Ambulatory Visit: Payer: Self-pay

## 2022-12-25 DIAGNOSIS — R410 Disorientation, unspecified: Secondary | ICD-10-CM | POA: Diagnosis present

## 2022-12-25 DIAGNOSIS — I10 Essential (primary) hypertension: Secondary | ICD-10-CM | POA: Diagnosis not present

## 2022-12-25 DIAGNOSIS — R404 Transient alteration of awareness: Secondary | ICD-10-CM

## 2022-12-25 LAB — COMPREHENSIVE METABOLIC PANEL
ALT: 34 U/L (ref 0–44)
AST: 69 U/L — ABNORMAL HIGH (ref 15–41)
Albumin: 4.2 g/dL (ref 3.5–5.0)
Alkaline Phosphatase: 80 U/L (ref 38–126)
Anion gap: 15 (ref 5–15)
BUN: 50 mg/dL — ABNORMAL HIGH (ref 8–23)
CO2: 27 mmol/L (ref 22–32)
Calcium: 9.9 mg/dL (ref 8.9–10.3)
Chloride: 100 mmol/L (ref 98–111)
Creatinine, Ser: 1.35 mg/dL — ABNORMAL HIGH (ref 0.61–1.24)
GFR, Estimated: 51 mL/min — ABNORMAL LOW (ref >60)
Glucose, Bld: 107 mg/dL — ABNORMAL HIGH (ref 70–99)
Potassium: 3 mmol/L — ABNORMAL LOW (ref 3.5–5.1)
Sodium: 142 mmol/L (ref 135–145)
Total Bilirubin: 1.5 mg/dL — ABNORMAL HIGH (ref 0.3–1.2)
Total Protein: 7.9 g/dL (ref 6.5–8.1)

## 2022-12-25 LAB — CBC WITH DIFFERENTIAL/PLATELET
Abs Immature Granulocytes: 0.06 10*3/uL (ref 0.00–0.07)
Basophils Absolute: 0.1 10*3/uL (ref 0.0–0.1)
Basophils Relative: 1 %
Eosinophils Absolute: 0.2 10*3/uL (ref 0.0–0.5)
Eosinophils Relative: 2 %
HCT: 44.5 % (ref 39.0–52.0)
Hemoglobin: 15.1 g/dL (ref 13.0–17.0)
Immature Granulocytes: 1 %
Lymphocytes Relative: 11 %
Lymphs Abs: 1.2 10*3/uL (ref 0.7–4.0)
MCH: 33 pg (ref 26.0–34.0)
MCHC: 33.9 g/dL (ref 30.0–36.0)
MCV: 97.2 fL (ref 80.0–100.0)
Monocytes Absolute: 0.7 10*3/uL (ref 0.1–1.0)
Monocytes Relative: 7 %
Neutro Abs: 8.2 10*3/uL — ABNORMAL HIGH (ref 1.7–7.7)
Neutrophils Relative %: 78 %
Platelets: 214 10*3/uL (ref 150–400)
RBC: 4.58 MIL/uL (ref 4.22–5.81)
RDW: 12.3 % (ref 11.5–15.5)
WBC: 10.4 10*3/uL (ref 4.0–10.5)
nRBC: 0 % (ref 0.0–0.2)

## 2022-12-25 LAB — TROPONIN I (HIGH SENSITIVITY)
Troponin I (High Sensitivity): 44 ng/L — ABNORMAL HIGH (ref <18)
Troponin I (High Sensitivity): 48 ng/L — ABNORMAL HIGH (ref <18)

## 2022-12-25 NOTE — ED Notes (Signed)
Syndi, RN called report back to [facility], [place].

## 2022-12-25 NOTE — ED Notes (Signed)
CALLED [facility] SPOKE WITH REP SCOTT, SCOTT STATED HE PUT THE PT ON THE LIST TO BE PICKED UP WHEN A TRUCK IS FREE.

## 2022-12-25 NOTE — ED Provider Notes (Signed)
[facility] Provider Note   Event Date/Time   First MD Initiated Contact with Patient 12/25/22 1215     (approximate) History  Altered Mental Status  HPI [name] is a 89 y.o. male with past medical history of hypertension, A-fib, vertigo, and chronic back pain who presents from [facility] via EMS after being found down this morning and not knowing how he got there.  Staff notes patient to be somewhat confused upon awakening however this confusion has subsided at this point.  Patient denies any complaints at this time ROS: Patient currently denies any vision changes, tinnitus, difficulty speaking, facial droop, sore throat, chest pain, shortness of breath, abdominal pain, nausea/vomiting/diarrhea, dysuria, or weakness/numbness/paresthesias in any extremity   Physical Exam  Triage Vital Signs: ED Triage Vitals  Encounter Vitals Group     BP      Systolic BP Percentile      Diastolic BP Percentile      Pulse      Resp      Temp      Temp src      SpO2      Weight      Height      Head Circumference      Peak Flow      Pain Score      Pain Loc      Pain Education      Exclude from Growth Chart    Most recent vital signs: Vitals:   12/25/22 1330 12/25/22 1400  BP: 111/70 120/72  Pulse: 74 67  Resp: (!) 8 18  Temp:    SpO2: 96% 100%   General: Awake, cooperative CV:  Good peripheral perfusion.  Resp:  Normal effort.  Abd:  No distention.  Other:  Elderly overweight Caucasian male resting comfortably in no acute distress ED Results / Procedures / Treatments  Labs (all labs ordered are listed, but only abnormal results are displayed) Labs Reviewed  COMPREHENSIVE METABOLIC PANEL - Abnormal; Notable for the following components:      Result Value   Potassium 3.0 (*)    Glucose, Bld 107 (*)    BUN 50 (*)    Creatinine, Ser 1.35 (*)    AST 69 (*)    Total Bilirubin 1.5 (*)    GFR, Estimated 51 (*)    All other components within normal  limits  CBC WITH DIFFERENTIAL/PLATELET - Abnormal; Notable for the following components:   Neutro Abs 8.2 (*)    All other components within normal limits  BLOOD GAS, VENOUS - Abnormal; Notable for the following components:   pO2, Ven <31 (*)    Bicarbonate 31.4 (*)    Acid-Base Excess 4.9 (*)    All other components within normal limits  TROPONIN I (HIGH SENSITIVITY) - Abnormal; Notable for the following components:   Troponin I (High Sensitivity) 44 (*)    All other components within normal limits  URINALYSIS, ROUTINE W REFLEX MICROSCOPIC  TROPONIN I (HIGH SENSITIVITY)   EKG ED ECG REPORT I, Merwyn Katos, the attending physician, personally viewed and interpreted this ECG. Date: 12/25/2022 EKG Time: 1223 Rate: 89 Rhythm: normal sinus rhythm QRS Axis: normal Intervals: RBBB ST/T Wave abnormalities: normal Narrative Interpretation: Normal sinus rhythm with right bundle branch block.  No evidence of acute ischemia PROCEDURES: Critical Care performed: No .1-3 Lead EKG Interpretation  Performed by: Merwyn Katos, MD Authorized by: Merwyn Katos, MD     Interpretation: normal  ECG rate:  71   ECG rate assessment: normal     Rhythm: sinus rhythm     Ectopy: none     Conduction: normal    MEDICATIONS ORDERED IN ED: Medications - No data to display IMPRESSION / MDM / ASSESSMENT AND PLAN / ED COURSE  I reviewed the triage vital signs and the nursing notes.                             The patient is on the cardiac monitor to evaluate for evidence of arrhythmia and/or significant heart rate changes. Patient's presentation is most consistent with acute presentation with potential threat to life or bodily function. The patient suffered an episode of altered mental status, but there is no overt concern for a dangerous emergent cause such as, but not limited to, CNS infection, severe Toxidrome, severe metabolic derangement, or stroke.  Given History, Physical, and Workup  the cause is not immediately apparent however patient has remained with normal mental status throughout emergency department course  Disposition: Discharge. At the time of discharge, the patient is back to baseline mental status.   FINAL CLINICAL IMPRESSION(S) / ED DIAGNOSES   Final diagnoses:  None   Rx / DC Orders   ED Discharge Orders     None      Note:  This document was prepared using Dragon voice recognition software and may include unintentional dictation errors.   Merwyn Katos, MD 12/25/22 [phone]

## 2022-12-25 NOTE — ED Triage Notes (Signed)
Pt brought in by EMS from [facility] for AMS. Per EMS, pt was found by staff member sleeping on the floor this morning; states pt thought he was at a friends house and didn't have a bed. Per EMS, last known normal was yesterday evening after dinner.

## 2022-12-25 NOTE — Discharge Instructions (Addendum)
Your testing today was overall reassuring.  Return to the ER for new or worsening symptoms.

## 2022-12-25 NOTE — ED Notes (Signed)
Per report from previous, RN pt has episodes of confusion and then is clear minded. Pt has EMS transport in for transport back to [facility]

## 2022-12-25 NOTE — ED Notes (Signed)
Upon EMS arrival this RN assessed pt and pt was unable to state where he lived. This RN asked ERP to assess pt prior to discharge. Dr. Rosalia Hammers came into room and pt was able to answer name and where he lives, pt stated he felt safe to go home. Per Dr. Rosalia Hammers pt is okay to go home at this time. Sydni, RN called report back to [facility] earlier, and they are aware of pts return.

## 2022-12-28 LAB — BLOOD GAS, VENOUS
Bicarbonate: 31.4 mmol/L — ABNORMAL HIGH (ref 20.0–28.0)
Bicarbonate: 31.4 mmol/L — ABNORMAL HIGH (ref 20.0–28.0)
O2 Saturation: 26.8 %
Patient temperature: 37
pCO2, Ven: 53 mmHg (ref 44–60)
pH, Ven: 7.38 (ref 7.25–7.43)

## 2023-01-21 ENCOUNTER — Ambulatory Visit: Admission: RE | Admit: 2023-01-21 | Payer: Medicare Other | Source: Ambulatory Visit

## 2023-02-02 ENCOUNTER — Inpatient Hospital Stay: Payer: Medicare Other | Admitting: Oncology

## 2023-02-02 ENCOUNTER — Telehealth: Payer: Self-pay | Admitting: Oncology

## 2023-02-02 NOTE — Telephone Encounter (Signed)
Per MD, pt appts need to be r/s due to pt not showing at the MRI appt. Appts have been r/s and I tried to call pt to let him know of the updates. I was unable to reach pt using both of the numbers listed. Mailing out AVS now.

## 2023-02-04 ENCOUNTER — Ambulatory Visit: Admission: RE | Admit: 2023-02-04 | Payer: Medicare Other | Source: Ambulatory Visit

## 2023-02-12 ENCOUNTER — Telehealth: Payer: Self-pay | Admitting: Oncology

## 2023-02-12 NOTE — Telephone Encounter (Signed)
Pt missed his MRI. Is scheduled to see MD on 12/17.  Zella Ball brought me his mail as it was undeliverable. The address is not correct.  I called the pt mobile and it went to vm and stated vmbx full so I could not leave a msg.  I called the home phone and it beeped and said call could not be completed as dialed.

## 2023-02-16 ENCOUNTER — Inpatient Hospital Stay: Payer: Medicare Other | Attending: Oncology | Admitting: Oncology

## 2023-09-16 ENCOUNTER — Emergency Department

## 2023-09-16 ENCOUNTER — Emergency Department
Admission: EM | Admit: 2023-09-16 | Discharge: 2023-09-17 | Disposition: A | Discharge status: Home / Self Care | Source: Skilled Nursing Facility | Attending: Emergency Medicine | Admitting: Emergency Medicine

## 2023-09-16 ENCOUNTER — Other Ambulatory Visit: Payer: Self-pay

## 2023-09-16 DIAGNOSIS — Z7901 Long term (current) use of anticoagulants: Secondary | ICD-10-CM | POA: Diagnosis not present

## 2023-09-16 DIAGNOSIS — W19XXXA Unspecified fall, initial encounter: Secondary | ICD-10-CM

## 2023-09-16 DIAGNOSIS — W1830XA Fall on same level, unspecified, initial encounter: Secondary | ICD-10-CM | POA: Diagnosis not present

## 2023-09-16 DIAGNOSIS — I10 Essential (primary) hypertension: Secondary | ICD-10-CM | POA: Diagnosis not present

## 2023-09-16 DIAGNOSIS — Z23 Encounter for immunization: Secondary | ICD-10-CM | POA: Diagnosis not present

## 2023-09-16 DIAGNOSIS — S0101XA Laceration without foreign body of scalp, initial encounter: Secondary | ICD-10-CM | POA: Insufficient documentation

## 2023-09-16 MED ORDER — TETANUS-DIPHTH-ACELL PERTUSSIS 5-2.5-18.5 LF-MCG/0.5 IM SUSY
0.5000 mL | PREFILLED_SYRINGE | Freq: Once | INTRAMUSCULAR | Status: AC
  Administered 2023-09-16: 0.5 mL via INTRAMUSCULAR
  Filled 2023-09-16: qty 0.5

## 2023-09-16 NOTE — Discharge Instructions (Addendum)
 You need to have your staples removed in 10 to 14 days.  Your CT imaging was negative return to the ER for worsening symptoms or any other concern

## 2023-09-16 NOTE — ED Notes (Signed)
 Patient was able to stand with the assistance of a walker an bear weight at this time. Ernest, MD made aware. Will continue to monitor.

## 2023-09-16 NOTE — ED Provider Notes (Addendum)
 [facility] Provider Note    Event Date/Time   First MD Initiated Contact with Patient 09/16/23 2147     (approximate)   History   Fall   HPI  [name] is a 89 y.o. male with history of HTN, AFIB who comes in with concerns for fall.  Patient reports that he was trying to sit down on the bed when he missed the bed and fell backwards and hit his head.  He denies any other pain anywhere else.  He is got a laceration to the back of his head.  He states that he ambulates with a walker.  Denies any chest pain, shortness of breath, abdominal pain.  Contrary to triage note the laceration was not to the front of the head but to the back of the head.  Physical Exam   Triage Vital Signs: ED Triage Vitals  Encounter Vitals Group     BP 09/16/23 2150 (!) 177/86     Girls Systolic BP Percentile --      Girls Diastolic BP Percentile --      Boys Systolic BP Percentile --      Boys Diastolic BP Percentile --      Pulse Rate 09/16/23 2150 70     Resp 09/16/23 2150 16     Temp 09/16/23 2150 98 F (36.7 C)     Temp Source 09/16/23 2150 Oral     SpO2 09/16/23 2150 98 %     Weight --      Height --      Head Circumference --      Peak Flow --      Pain Score 09/16/23 2151 0     Pain Loc --      Pain Education --      Exclude from Growth Chart --     Most recent vital signs: Vitals:   09/16/23 2150  BP: (!) 177/86  Pulse: 70  Resp: 16  Temp: 98 F (36.7 C)  SpO2: 98%     General: Awake, no distress.  CV:  Good peripheral perfusion.  Resp:  Normal effort.  Abd:  No distention.  Other:  No chest wall tenderness no abdominal tenderness able to lift both legs up off the bed report some pain in his left hip but he reports that is chronic not new from today's fall.  He is got about a 3 cm laceration on the back of his head no active bleeding.  No extremity tenderness   ED Results / Procedures / Treatments   Labs (all labs ordered are listed, but  only abnormal results are displayed) Labs Reviewed - No data to display   RADIOLOGY I have reviewed the ct personally and interpreted no intracranial hemorrhage   PROCEDURES:  Critical Care performed: No  Procedures   MEDICATIONS ORDERED IN ED: Medications  Tdap (BOOSTRIX) injection 0.5 mL (has no administration in time range)     IMPRESSION / MDM / ASSESSMENT AND PLAN / ED COURSE  I reviewed the triage vital signs and the nursing notes.   Patient's presentation is most consistent with acute presentation with potential threat to life or bodily function.   Patient comes in with a fall hitting his head CT imaging ordered evaluate for intracranial hemorrhage from a cervical fracture and these were negative it sounds mechanical fall no concerns for this being syncope he denies any chest pain, shortness of breath.  No indication for blood work, EKG.  Patient's laceration was repaired with staples tolerated well will update Tdap.  He declined anything for pain.  Patient reports that he makes his own medical decisions.  Asked patient if he had any family members that he would want me to call to update he declined.  If patient is ambulatory patient will be discharged back to facility  Pt able to ambulate.  Doubt any hip fracture.   FINAL CLINICAL IMPRESSION(S) / ED DIAGNOSES   Final diagnoses:  Fall, initial encounter  Laceration of scalp, initial encounter     Rx / DC Orders   ED Discharge Orders     None        Note:  This document was prepared using Dragon voice recognition software and may include unintentional dictation errors.   Ernest Ronal BRAVO, MD 09/16/23 7664    Ernest Ronal BRAVO, MD 09/17/23 0000    Ernest Ronal BRAVO, MD 09/17/23 0001

## 2023-09-16 NOTE — ED Triage Notes (Signed)
 BIB EMS from [facility] for an unwitnessed ground level fall. Patient has a small laceration on his forehead per EMS. Bleeding well controlled on arrival. Patient is not on blood thinners.

## 2023-09-17 NOTE — ED Notes (Signed)
 [facility]/called for transport. Spoke with pam at 12:27am

## 2023-10-24 ENCOUNTER — Emergency Department

## 2023-10-24 ENCOUNTER — Emergency Department: Admission: EM | Admit: 2023-10-24 | Discharge: 2023-10-24 | Disposition: A | Discharge status: Home / Self Care

## 2023-10-24 DIAGNOSIS — I700 Atherosclerosis of aorta: Secondary | ICD-10-CM | POA: Insufficient documentation

## 2023-10-24 DIAGNOSIS — I10 Essential (primary) hypertension: Secondary | ICD-10-CM | POA: Insufficient documentation

## 2023-10-24 DIAGNOSIS — Z8673 Personal history of transient ischemic attack (TIA), and cerebral infarction without residual deficits: Secondary | ICD-10-CM | POA: Diagnosis not present

## 2023-10-24 DIAGNOSIS — N3001 Acute cystitis with hematuria: Secondary | ICD-10-CM | POA: Diagnosis not present

## 2023-10-24 DIAGNOSIS — M4312 Spondylolisthesis, cervical region: Secondary | ICD-10-CM | POA: Insufficient documentation

## 2023-10-24 DIAGNOSIS — Z515 Encounter for palliative care: Secondary | ICD-10-CM | POA: Insufficient documentation

## 2023-10-24 DIAGNOSIS — I517 Cardiomegaly: Secondary | ICD-10-CM | POA: Insufficient documentation

## 2023-10-24 DIAGNOSIS — G319 Degenerative disease of nervous system, unspecified: Secondary | ICD-10-CM | POA: Diagnosis not present

## 2023-10-24 DIAGNOSIS — Z86711 Personal history of pulmonary embolism: Secondary | ICD-10-CM | POA: Insufficient documentation

## 2023-10-24 DIAGNOSIS — M25552 Pain in left hip: Secondary | ICD-10-CM | POA: Insufficient documentation

## 2023-10-24 DIAGNOSIS — M1612 Unilateral primary osteoarthritis, left hip: Secondary | ICD-10-CM | POA: Diagnosis not present

## 2023-10-24 DIAGNOSIS — S51011A Laceration without foreign body of right elbow, initial encounter: Secondary | ICD-10-CM | POA: Insufficient documentation

## 2023-10-24 DIAGNOSIS — R519 Headache, unspecified: Secondary | ICD-10-CM | POA: Diagnosis not present

## 2023-10-24 DIAGNOSIS — W19XXXA Unspecified fall, initial encounter: Secondary | ICD-10-CM | POA: Diagnosis not present

## 2023-10-24 DIAGNOSIS — Z66 Do not resuscitate: Secondary | ICD-10-CM | POA: Diagnosis not present

## 2023-10-24 LAB — COMPREHENSIVE METABOLIC PANEL WITH GFR
ALT: 17 U/L (ref 0–44)
AST: 20 U/L (ref 15–41)
Albumin: 2.9 g/dL — ABNORMAL LOW (ref 3.5–5.0)
Alkaline Phosphatase: 102 U/L (ref 38–126)
Anion gap: 10 (ref 5–15)
BUN: 16 mg/dL (ref 8–23)
CO2: 25 mmol/L (ref 22–32)
Calcium: 8.7 mg/dL — ABNORMAL LOW (ref 8.9–10.3)
Chloride: 104 mmol/L (ref 98–111)
Creatinine, Ser: 1.02 mg/dL (ref 0.61–1.24)
GFR, Estimated: >60 mL/min (ref >60)
Glucose, Bld: 113 mg/dL — ABNORMAL HIGH (ref 70–99)
Potassium: 3.9 mmol/L (ref 3.5–5.1)
Sodium: 139 mmol/L (ref 135–145)
Total Bilirubin: 0.8 mg/dL (ref 0.0–1.2)
Total Protein: 6.7 g/dL (ref 6.5–8.1)

## 2023-10-24 LAB — CBC WITH DIFFERENTIAL/PLATELET
Abs Granulocyte: 8.5 K/uL — ABNORMAL HIGH (ref 1.5–6.5)
Abs Immature Granulocytes: 0.06 K/uL (ref 0.00–0.07)
Basophils Absolute: 0.1 K/uL (ref 0.0–0.1)
Basophils Relative: 1 %
Eosinophils Absolute: 0.2 K/uL (ref 0.0–0.5)
Eosinophils Relative: 2 %
HCT: 37.4 % — ABNORMAL LOW (ref 39.0–52.0)
Hemoglobin: 12.1 g/dL — ABNORMAL LOW (ref 13.0–17.0)
Immature Granulocytes: 1 %
Lymphocytes Relative: 8 %
Lymphs Abs: 0.9 K/uL (ref 0.7–4.0)
MCH: 31.6 pg (ref 26.0–34.0)
MCHC: 32.4 g/dL (ref 30.0–36.0)
MCV: 97.7 fL (ref 80.0–100.0)
Monocytes Absolute: 0.8 K/uL (ref 0.1–1.0)
Monocytes Relative: 8 %
Neutro Abs: 8.5 K/uL — ABNORMAL HIGH (ref 1.7–7.7)
Neutrophils Relative %: 80 %
Platelets: 282 K/uL (ref 150–400)
RBC: 3.83 MIL/uL — ABNORMAL LOW (ref 4.22–5.81)
RDW: 12.8 % (ref 11.5–15.5)
Smear Review: NORMAL
WBC: 10.4 K/uL (ref 4.0–10.5)
nRBC: 0 % (ref 0.0–0.2)

## 2023-10-24 LAB — URINALYSIS, COMPLETE (UACMP) WITH MICROSCOPIC
Bilirubin Urine: NEGATIVE
Glucose, UA: NEGATIVE mg/dL
Ketones, ur: NEGATIVE mg/dL
Nitrite: NEGATIVE
Protein, ur: 30 mg/dL — AB
RBC / HPF: >50 RBC/hpf (ref 0–5)
Specific Gravity, Urine: 1.009 (ref 1.005–1.030)
WBC, UA: >50 WBC/hpf (ref 0–5)
pH: 7 (ref 5.0–8.0)

## 2023-10-24 LAB — RESP PANEL BY RT-PCR (RSV, FLU A&B, COVID)  RVPGX2
Influenza A by PCR: NEGATIVE
Influenza B by PCR: NEGATIVE
Resp Syncytial Virus by PCR: NEGATIVE
SARS Coronavirus 2 by RT PCR: NEGATIVE

## 2023-10-24 LAB — LIPASE, BLOOD: Lipase: 26 U/L (ref 11–51)

## 2023-10-24 LAB — CK: Total CK: 221 U/L (ref 49–397)

## 2023-10-24 LAB — PROTIME-INR
INR: 1.1 (ref 0.8–1.2)
Prothrombin Time: 14.8 s (ref 11.4–15.2)

## 2023-10-24 MED ORDER — SODIUM CHLORIDE 0.9 % IV SOLN
2.0000 g | Freq: Once | INTRAVENOUS | Status: AC
  Administered 2023-10-24: 2 g via INTRAVENOUS
  Filled 2023-10-24: qty 20

## 2023-10-24 MED ORDER — ACETAMINOPHEN 500 MG PO TABS
1000.0000 mg | ORAL_TABLET | Freq: Once | ORAL | Status: DC
  Filled 2023-10-24: qty 2

## 2023-10-24 NOTE — ED Notes (Signed)
 Report called to [facility]. This RN spoke with Nena, [employer].

## 2023-10-24 NOTE — ED Provider Notes (Signed)
-----------------------------------------   8:26 PM on 10/24/2023 -----------------------------------------  Patient was found down on the floor next to his bed.  Patient did suffer a small skin tear to the right elbow does not believe he hit his head.  He has good range of motion in all extremities in all joints.  No concern for fracture dislocations.  Patient's repeat CT scan of the head shows no significant finding no change from earlier.  Will discharge back to the patient's care facility, an antibiotic is already been called in by the patient's PCP for his urinary tract infection.   Dorothyann Drivers, MD 10/24/23 2026

## 2023-10-24 NOTE — ED Provider Notes (Addendum)
 [facility] Provider Note    Event Date/Time   First MD Initiated Contact with Patient 10/24/23 1135     (approximate)   History   Fall  Pt comes via EMS from [facility] for UTI and possible sepsis. Pt refused transport earlier this morning.   Pt had fall today hour ago and was on floor when found by staff. EMS arrived and pt c/o hip pain. Pt has had multiple falls. Pt is also on hospice care.   Pt did give sample and facility said UTI but didn't have meds for him.  Pt has strong odor of urine present the patient  at baseline. Pt clothed saturated in urine.   167/84 88 HR 97% RA 97.8 CBG 131  See first nurse note. Pt to baseline per EMS.   HPI [name] is a 89 y.o. male PMH subdural hematoma, atrial fibrillation not on anticoagulation, hypertension, prior PE presents for evaluation after fall - Patient is a limited historian, AO x 1.  Does confirm that he fell.  Otherwise not able to provide any history. - Complete paperwork confirms DNR/DNI - Patient is on a hospice program        Physical Exam   Triage Vital Signs: ED Triage Vitals  Encounter Vitals Group     BP 10/24/23 1114 (!) 175/90     Girls Systolic BP Percentile --      Girls Diastolic BP Percentile --      Boys Systolic BP Percentile --      Boys Diastolic BP Percentile --      Pulse Rate 10/24/23 1114 86     Resp 10/24/23 1114 20     Temp 10/24/23 1114 98.3 F (36.8 C)     Temp src --      SpO2 10/24/23 1114 95 %     Weight 10/24/23 1119 199 lb 15.3 oz (90.7 kg)     Height 10/24/23 1119 5' 10 (1.778 m)     Head Circumference --      Peak Flow --      Pain Score 10/24/23 1119 0     Pain Loc --      Pain Education --      Exclude from Growth Chart --     Most recent vital signs: Vitals:   10/24/23 1330 10/24/23 1400  BP:    Pulse: 83 77  Resp: 16 17  Temp:    SpO2: 98% 97%     General: Awake, no distress.  Pants are soaked in  urine. HEENT: Normocephalic, atraumatic, no midline neck pain CV:  Good peripheral perfusion. RRR, RP 2+ Resp:  Normal effort. CTAB Abd:  No distention. Nontender to deep palpation throughout Other:  + Tender to palpation over left hip with limb shortening and external rotation of left lower extremity.   ED Results / Procedures / Treatments   Labs (all labs ordered are listed, but only abnormal results are displayed) Labs Reviewed  URINALYSIS, COMPLETE (UACMP) WITH MICROSCOPIC - Abnormal; Notable for the following components:      Result Value   Color, Urine YELLOW (*)    APPearance TURBID (*)    Hgb urine dipstick SMALL (*)    Protein, ur 30 (*)    Leukocytes,Ua LARGE (*)    Bacteria, UA MANY (*)    All other components within normal limits  CBC WITH DIFFERENTIAL/PLATELET - Abnormal; Notable for the following components:   RBC 3.83 (*)  Hemoglobin 12.1 (*)    HCT 37.4 (*)    Neutro Abs 8.5 (*)    Abs Granulocyte 8.5 (*)    All other components within normal limits  RESP PANEL BY RT-PCR (RSV, FLU A&B, COVID)  RVPGX2  LIPASE, BLOOD  CK  PROTIME-INR  CBC WITH DIFFERENTIAL/PLATELET  COMPREHENSIVE METABOLIC PANEL WITH GFR     EKG  Ecg = sinus rhythm, rate 78, no gross ST elevation or depression, no significant repolarization normality, right bundle branch block present.  Left axis deviation.  Normal intervals.  No evidence of ischemia or arrhythmia on my interpretation.   RADIOLOGY Radiology interpreted by myself and radiology reports reviewed.  No acute pathology identified.    PROCEDURES:  Critical Care performed: No  Procedures   MEDICATIONS ORDERED IN ED: Medications  acetaminophen  (TYLENOL ) tablet 1,000 mg (has no administration in time range)  cefTRIAXone  (ROCEPHIN ) 2 g in sodium chloride  0.9 % 100 mL IVPB (has no administration in time range)     IMPRESSION / MDM / ASSESSMENT AND PLAN / ED COURSE  I reviewed the triage vital signs and the nursing  notes.                              DDX/MDM/AP: Differential diagnosis includes, but is not limited to, consider possibility of traumatic injury from fall including intracranial hemorrhage, skull fracture, C-spine fracture.  Also concern for left hip fracture on my evaluation.  With regard to etiologies of fall, consider underlying urinary tract infection, clinical decompensation of this patient already on hospice with apparent dementia, underlying electrolyte abnormality or anemia.  Also screen for underlying pneumonia or viral illness including COVID-19 or influenza.  Plan: - Labs - N.p.o. - CT head, CT C-spine X-ray left hip/pelvis, chest x-ray - Will attempt to gather collateral information - Reassess  Patient's presentation is most consistent with acute presentation with potential threat to life or bodily function.  The patient is on the cardiac monitor to evaluate for evidence of arrhythmia and/or significant heart rate changes.  ED course below.  Laboratory workup unremarkable other than evidence of UTI, treated with ceftriaxone .  Goals of care clarified with patient's POA, see ED course below.  Also spoke with patient's hospice care fighter who confirms UTI was diagnosed earlier today as well and they had already prescribed ciprofloxacin though it had not yet been administered --will defer repeat prescription at this time and plan to continue with ciprofloxacin at patient's facility.  Has 24/7 care there.  Unfortunately did have delayed labs due to difficult access and initial collection of CMP hemolyzed --signed out to oncoming ED provider pending CMP results, lab to redraw.  Conditional discharge order assuming no severe derangements.  POA and hospice care team in agreement with plan.  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 No acute fracture or dislocation. 2. Moderate arthritic changes of  the left hip.   [MM]  1348 CXR: IMPRESSION: 1. No active disease. 2. Mild cardiomegaly.   [MM]  1533 CTH: IMPRESSION: 1.  No evidence of an acute intracranial abnormality. 2. Prominence of the lateral and third ventricles, similar to the prior head CT of 09/16/2023. This is at least partly due to advanced cerebral atrophy. However, there is also an acute callosal angle and some crowding of the sulci at the level of the high frontal and parietal lobes, and a component of normal pressure hydrocephalus (NPH) is possible in the appropriate clinical setting. 3. 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 4. Mild paranasal sinus mucosal thickening at the imaged levels.   [MM]  1533 CT Cspine: IMPRESSION: 1. No evidence of an acute cervical spine fracture. 2. 2 mm grade 1 anterolisthesis at C2-C3, C6-C7 and C7-T1. 3. Dextrocurvature of the cervical spine. 4. Cervical spondylosis as described. 5. Facet ankylosis on the left at C3-C4.   [MM]  1537 Spoke w/ pt's hospice manager Saddie -Confirms patient was only sent to emergency department because of fall, appears to be his assisted living facility policy.  Had been found to have a UTI earlier today and had prescribed ciprofloxacin but had not yet taken it-Will plan to continue with ciprofloxacin when he returns.  Has 24/7 care at his facility and will remain on hospice program given no evidence of any traumatic injuries today. [MM]    Clinical Course User Index [MM]  Clarine Ozell LABOR, MD     FINAL CLINICAL IMPRESSION(S) / ED DIAGNOSES   Final diagnoses:  Acute cystitis with hematuria  Fall, initial encounter  Hospice care patient     Rx / DC Orders   ED Discharge Orders     None        Note:  This document was prepared using Dragon voice recognition software and may include unintentional dictation errors.   Clarine Ozell LABOR, MD 10/24/23 1550    Clarine Ozell LABOR, MD 10/24/23 [phone]

## 2023-10-24 NOTE — Discharge Instructions (Signed)
 Mr. Rosevear evaluation in the emergency department was notable for urinary tract infection, and we found no traumatic injuries from his fall.  It appears she has already been prescribed ciprofloxacin hide-please continue with this already prescribed medication to treat this.  Please follow-up closely with his hospice care team for further management.

## 2023-10-24 NOTE — ED Notes (Signed)
 Called to [employer] per RN Zoyie/Transport to [facility] [place]/Rep [employer].

## 2023-10-24 NOTE — ED Triage Notes (Signed)
 Pt comes via EMS from [facility] for UTI and possible sepsis. Pt refused transport earlier this morning.   Pt had fall today hour ago and was on floor when found by staff. EMS arrived and pt c/o hip pain. Pt has had multiple falls. Pt is also on hospice care.   Pt did give sample and facility said UTI but didn't have meds for him.  Pt has strong odor of urine present the patient  at baseline. Pt clothed saturated in urine.   167/84 88 HR 97% RA 97.8 CBG 131

## 2023-10-24 NOTE — ED Notes (Signed)
 This RN was notified by a visitor that pt was on floor. RN found pt laying on the floor and called staff to assist. Pt alert and in NAD. Pt stated I laid on the floor. Pt IV had been removed. Skin tear noted on right elbow and right forearm. Dorothyann, MD, and Rosina Naomi PEAK, made aware and to bedside. CT head and safety observation ordered.

## 2023-10-24 NOTE — ED Triage Notes (Signed)
 See first nurse note. Pt to baseline per EMS.

## 2023-10-24 NOTE — ED Notes (Signed)
 Consuelo with [facility] called this RN, states pt had 2 falls this morning and c/o hip pain and reports facility did a urine dipstick that was positive for UTI, pt prescribed Cipro but not yet started. States that hospice liaison for [place] has already been notified.

## 2023-10-24 NOTE — Progress Notes (Signed)
 [facility] ([facility]) Hospitalized Hospice Patient  Mr. Downs is a current hospice patient followed at [facility] for terminal diagnosis of Vascular Dementia.  Patient experienced a fall this morning at his facility and complains of hip pain.  Patient was also just diagnosed with UTI and Cipro was called it- but patient has not started yet.  Hospital Liaison will follow through hospital disposition.  Please do not hesitate to call with any hospice related questions or concerns.  Saddie HILARIO Na, RN Nurse Liaison [phone]

## 2023-11-29 ENCOUNTER — Emergency Department

## 2023-11-29 ENCOUNTER — Emergency Department
Admission: EM | Admit: 2023-11-29 | Discharge: 2023-11-29 | Disposition: A | Discharge status: Skilled Nursing Facility | Attending: Emergency Medicine | Admitting: Emergency Medicine

## 2023-11-29 ENCOUNTER — Other Ambulatory Visit: Payer: Self-pay

## 2023-11-29 ENCOUNTER — Encounter: Payer: Self-pay | Admitting: Emergency Medicine

## 2023-11-29 DIAGNOSIS — W19XXXA Unspecified fall, initial encounter: Secondary | ICD-10-CM | POA: Diagnosis not present

## 2023-11-29 DIAGNOSIS — M25552 Pain in left hip: Secondary | ICD-10-CM | POA: Insufficient documentation

## 2023-11-29 DIAGNOSIS — I10 Essential (primary) hypertension: Secondary | ICD-10-CM | POA: Diagnosis not present

## 2023-11-29 DIAGNOSIS — R410 Disorientation, unspecified: Secondary | ICD-10-CM | POA: Insufficient documentation

## 2023-11-29 LAB — COMPREHENSIVE METABOLIC PANEL WITH GFR
ALT: 10 U/L (ref 0–44)
AST: 16 U/L (ref 15–41)
Albumin: 3.4 g/dL — ABNORMAL LOW (ref 3.5–5.0)
Alkaline Phosphatase: 134 U/L — ABNORMAL HIGH (ref 38–126)
Anion gap: 13 (ref 5–15)
BUN: 16 mg/dL (ref 8–23)
CO2: 25 mmol/L (ref 22–32)
Calcium: 8.9 mg/dL (ref 8.9–10.3)
Chloride: 102 mmol/L (ref 98–111)
Creatinine, Ser: 0.89 mg/dL (ref 0.61–1.24)
GFR, Estimated: >60 mL/min (ref >60)
Glucose, Bld: 103 mg/dL — ABNORMAL HIGH (ref 70–99)
Potassium: 3.6 mmol/L (ref 3.5–5.1)
Sodium: 140 mmol/L (ref 135–145)
Total Bilirubin: 1 mg/dL (ref 0.0–1.2)
Total Protein: 7.2 g/dL (ref 6.5–8.1)

## 2023-11-29 LAB — CBC
HCT: 41.5 % (ref 39.0–52.0)
Hemoglobin: 12.9 g/dL — ABNORMAL LOW (ref 13.0–17.0)
MCH: 30.4 pg (ref 26.0–34.0)
MCHC: 31.1 g/dL (ref 30.0–36.0)
MCV: 97.9 fL (ref 80.0–100.0)
Platelets: 252 K/uL (ref 150–400)
RBC: 4.24 MIL/uL (ref 4.22–5.81)
RDW: 13.3 % (ref 11.5–15.5)
WBC: 6.8 K/uL (ref 4.0–10.5)
nRBC: 0 % (ref 0.0–0.2)

## 2023-11-29 LAB — URINALYSIS, ROUTINE W REFLEX MICROSCOPIC
Bilirubin Urine: NEGATIVE
Glucose, UA: NEGATIVE mg/dL
Hgb urine dipstick: NEGATIVE
Ketones, ur: NEGATIVE mg/dL
Leukocytes,Ua: NEGATIVE
Nitrite: NEGATIVE
Protein, ur: NEGATIVE mg/dL
Specific Gravity, Urine: 1.015 (ref 1.005–1.030)
pH: 6 (ref 5.0–8.0)

## 2023-11-29 MED ORDER — ONDANSETRON 4 MG PO TBDP
4.0000 mg | ORAL_TABLET | Freq: Once | ORAL | Status: AC
  Administered 2023-11-29: 4 mg via ORAL
  Filled 2023-11-29: qty 1

## 2023-11-29 MED ORDER — HYDROCODONE-ACETAMINOPHEN 5-325 MG PO TABS
1.0000 | ORAL_TABLET | Freq: Once | ORAL | Status: AC
  Administered 2023-11-29: 1 via ORAL
  Filled 2023-11-29: qty 1

## 2023-11-29 NOTE — ED Notes (Signed)
 Assisted patient with the use of a urinal at this time. Patient provided with a warm blanket. No other needs stated at this time. Call light is within reach.

## 2023-11-29 NOTE — ED Triage Notes (Signed)
 Patient to ED via [facility]  for AMS. Not at baseline today per staff. Only alert to self at this time. Facility reports a fall over the weekend w/ pt c/o left hip pain. Pt denying pain at this time. Skin tear to right elbow.

## 2023-11-29 NOTE — ED Provider Notes (Signed)
 [facility] Provider Note    Event Date/Time   First MD Initiated Contact with Patient 11/29/23 1417     (approximate)   History   Altered Mental Status   HPI  [name] is a 89 year old male with history of HTN, A-fib presenting to the emergency department for evaluation of altered mental status.  Per report, patient had a fall over the weekend has been complaining of left hip pain.  Unknown head strike or LOC.  Patient reportedly not at his baseline, further information about this not provided.  Reviewed ER visit from 10/24/2023.  At that time patient presented with hip pain and multiple falls.  Noted to be alert and oriented x 1, on hospice.  Case was discussed with patient's power of attorney who reported that he was confused but ambulatory at baseline.  Also reviewed with patient's hospice manager who noted that at that time patient was transferred due to assisted living facility protocol, but patient remained on hospice.     Physical Exam   Triage Vital Signs: ED Triage Vitals [11/29/23 1234]  Encounter Vitals Group     BP (!) 155/89     Girls Systolic BP Percentile      Girls Diastolic BP Percentile      Boys Systolic BP Percentile      Boys Diastolic BP Percentile      Pulse Rate 74     Resp 17     Temp 98.2 F (36.8 C)     Temp Source Oral     SpO2 100 %     Weight 198 lb 6.6 oz (90 kg)     Height 5' 10 (1.778 m)     Head Circumference      Peak Flow      Pain Score 0     Pain Loc      Pain Education      Exclude from Growth Chart     Most recent vital signs: Vitals:   11/29/23 1234  BP: (!) 155/89  Pulse: 74  Resp: 17  Temp: 98.2 F (36.8 C)  SpO2: 100%    Nursing notes and vital signs reviewed.  General: Adult male, laying in bed, awake, reactive Head: Atraumatic Chest: Symmetric chest rise, no tenderness to palpation.  Cardiac: Regular rhythm and rate.  Respiratory: Lungs clear to auscultation Abdomen: Soft,  nondistended. No tenderness to palpation.  Pelvis: Stable in AP and lateral compression.  MSK: No deformity to bilateral upper and lower extremity. Full range of motion to bilateral upper lower extremity, the patient is slower to move his left hip and does report some pain with this Neuro: Alert, oriented to self, able to tell me that we are in the hospital.  5 out of 5 strength in bilateral upper and lower extremities. Normal sensation to light touch in bilateral upper and lower extremity. Skin: No evidence of burns or lacerations.   ED Results / Procedures / Treatments   Labs (all labs ordered are listed, but only abnormal results are displayed) Labs Reviewed  COMPREHENSIVE METABOLIC PANEL WITH GFR - Abnormal; Notable for the following components:      Result Value   Glucose, Bld 103 (*)    Albumin  3.4 (*)    Alkaline Phosphatase 134 (*)    All other components within normal limits  CBC - Abnormal; Notable for the following components:   Hemoglobin 12.9 (*)    All other components within normal limits  URINALYSIS,  ROUTINE W REFLEX MICROSCOPIC  CBG MONITORING, ED     EKG EKG independently reviewed and interpreted by myself demonstrates:    RADIOLOGY Imaging independently reviewed and interpreted by myself demonstrates:   Formal Radiology Read:  No results found.  PROCEDURES:  Critical Care performed: No  Procedures   MEDICATIONS ORDERED IN ED: Medications  HYDROcodone -acetaminophen  (NORCO/VICODIN) 5-325 MG per tablet 1 tablet (has no administration in time range)  ondansetron  (ZOFRAN -ODT) disintegrating tablet 4 mg (has no administration in time range)     IMPRESSION / MDM / ASSESSMENT AND PLAN / ED COURSE  I reviewed the triage vital signs and the nursing notes.  Differential diagnosis includes, but is not limited to, intracranial bleed, hip fracture, dislocation, anemia, electrolyte abnormality  Patient's presentation is most consistent with acute  presentation with potential threat to life or bodily function.  89 year old male presenting with after a fall with hip pain and possible altered mental status.  Stable vitals on presentation.  Reassuring CBC, CMP.  With fall and report of possible altered mental status, CT head and C-spine were obtained though patient documented previously is confused and awake and interactive on exam here, question if this may be close to his baseline.  Does complain of left hip pain, x-Aashka Salomone ordered to further evaluate.  Added to physician at 1515 pending completion of imaging and disposition.  Of note, patient is on hospice so if testing is overall reassuring, suspect he will be stable for discharge back to his facility.      FINAL CLINICAL IMPRESSION(S) / ED DIAGNOSES   Final diagnoses:  Left hip pain  Confusion     Rx / DC Orders   ED Discharge Orders     None        Note:  This document was prepared using Dragon voice recognition software and may include unintentional dictation errors.   Levander Slate, MD 11/29/23 [phone]

## 2023-11-29 NOTE — ED Triage Notes (Signed)
 Arrives via [facility] from [facility].  C/O left hip pain. Per facility, patient had a fall over the weekend. Today, patient presents with AMS not at his baseline.  No sign of head injury or shortening or rotation of hip.  Skin tear to right elbow.  Awake and alert to person.  No thinners. Fall was unwitnessed.  VS wnl.

## 2023-11-29 NOTE — ED Notes (Signed)
 [facility] called for transport to [facility] , spoke with [employer]

## 2023-11-29 NOTE — ED Provider Notes (Signed)
-----------------------------------------   3:07 PM on 11/29/2023 -----------------------------------------  Blood pressure (!) 155/89, pulse 74, temperature 98.2 F (36.8 C), temperature source Oral, resp. rate 17, height 5' 10 (1.778 m), weight 90 kg, SpO2 100%.  Assuming care from Dr. Levander.  In short, [name] is a 89 y.o. male with a chief complaint of Altered Mental Status .  Refer to the original H&P for additional details.  The current plan of care is to follow-up imaging for fall.  ----------------------------------------- 5:10 PM on 11/29/2023 ----------------------------------------- CT head and cervical spine are negative for acute finding, x-ray of left hip is also unremarkable.  Patient able to range left hip with minimal pain and low suspicion for occult fracture at this time.  He is appropriate for discharge back to facility, states he feels well at this time.       Willo Dunnings, MD 11/29/23 [phone]

## 2023-11-29 NOTE — Progress Notes (Addendum)
 [facility] Liaison Note  This patient is currently followed by [facility].  [facility] will follow through discharge disposition.  Please call with any Hospice related questions or concerns.   [facility] Liaison [phone]

## 2023-11-29 NOTE — ED Notes (Signed)
 Patient attempting to get out of bed, and asking for pants so he could leave. This tech redirected him, and provided him with a warm blanket, and the tv remote. Patient currently resting in bed, watching sports. No needs at this time.

## 2024-03-09 IMAGING — DX DG KNEE 1-2V*R*
2 series · 2 of 2 positions shown · non-contrast
Comparison: None.

CLINICAL DATA: fell out of wheelchair having right knee pain

EXAM:
RIGHT KNEE - 1-2 VIEW

[knee ap]
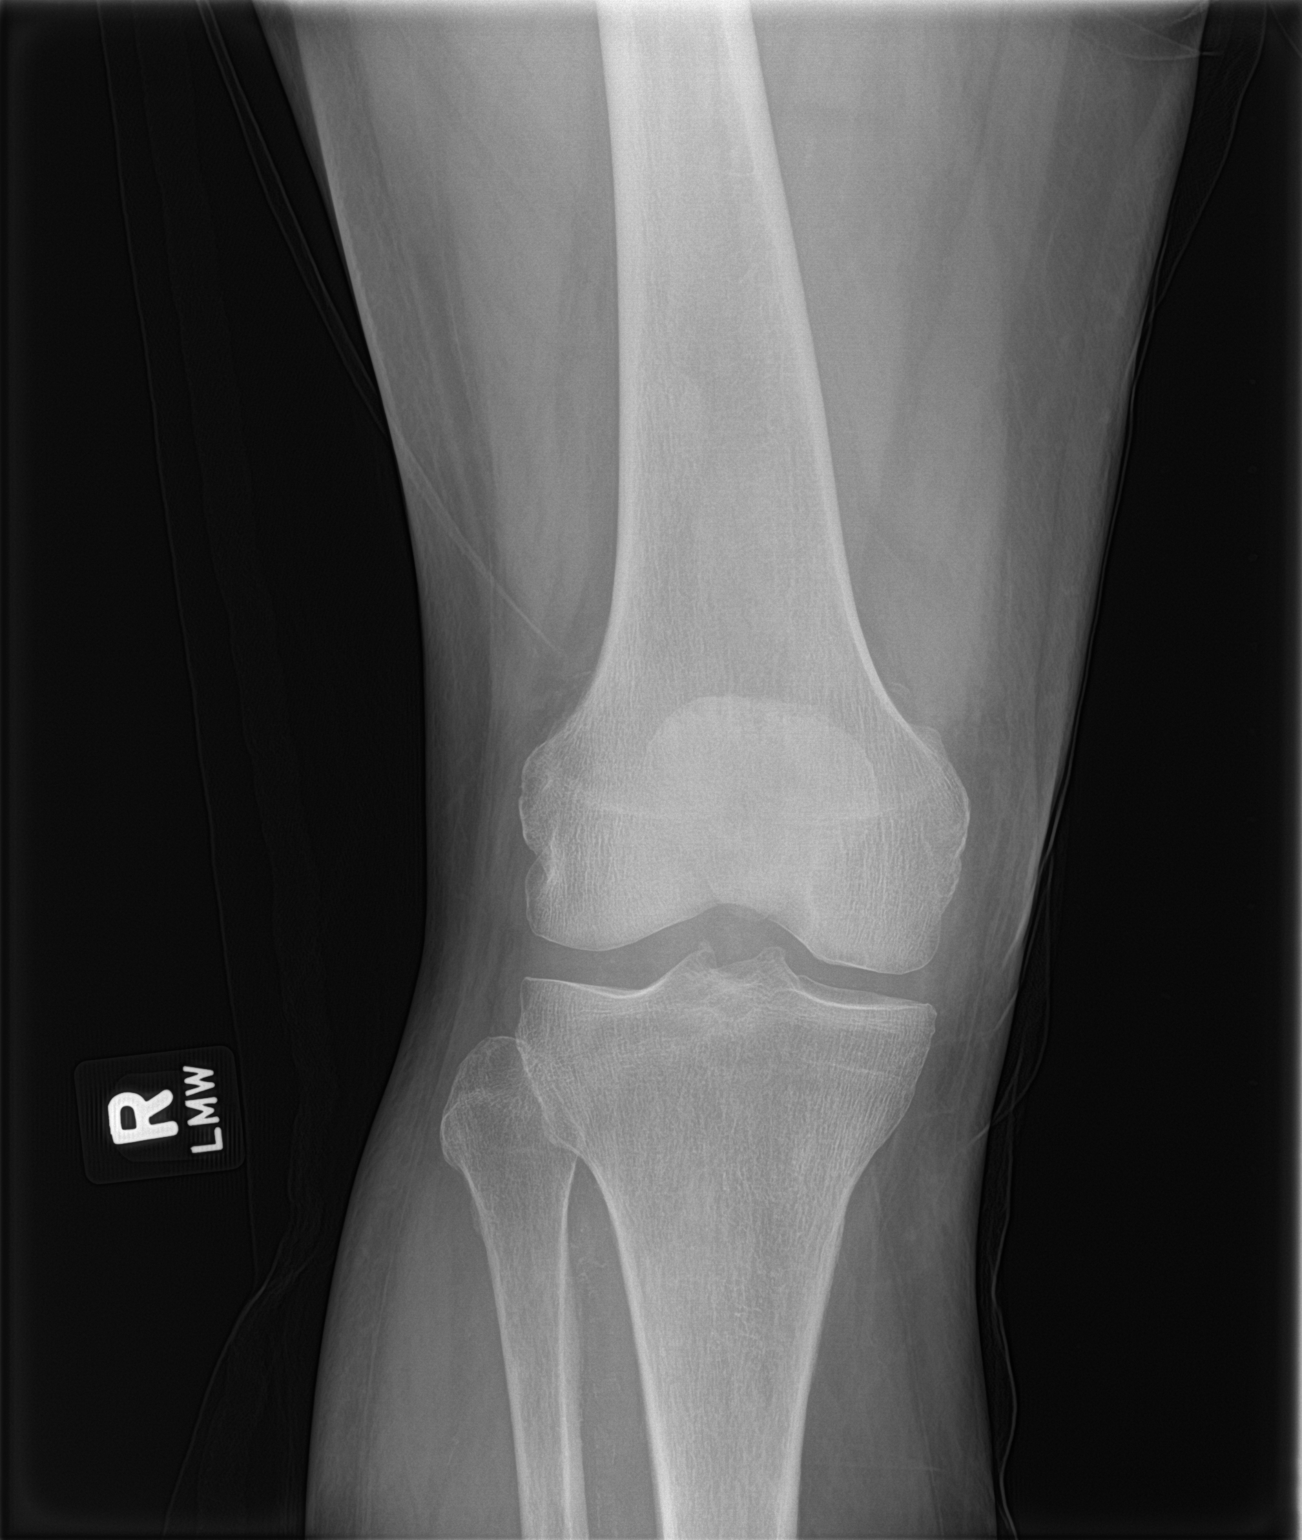

[knee lat]
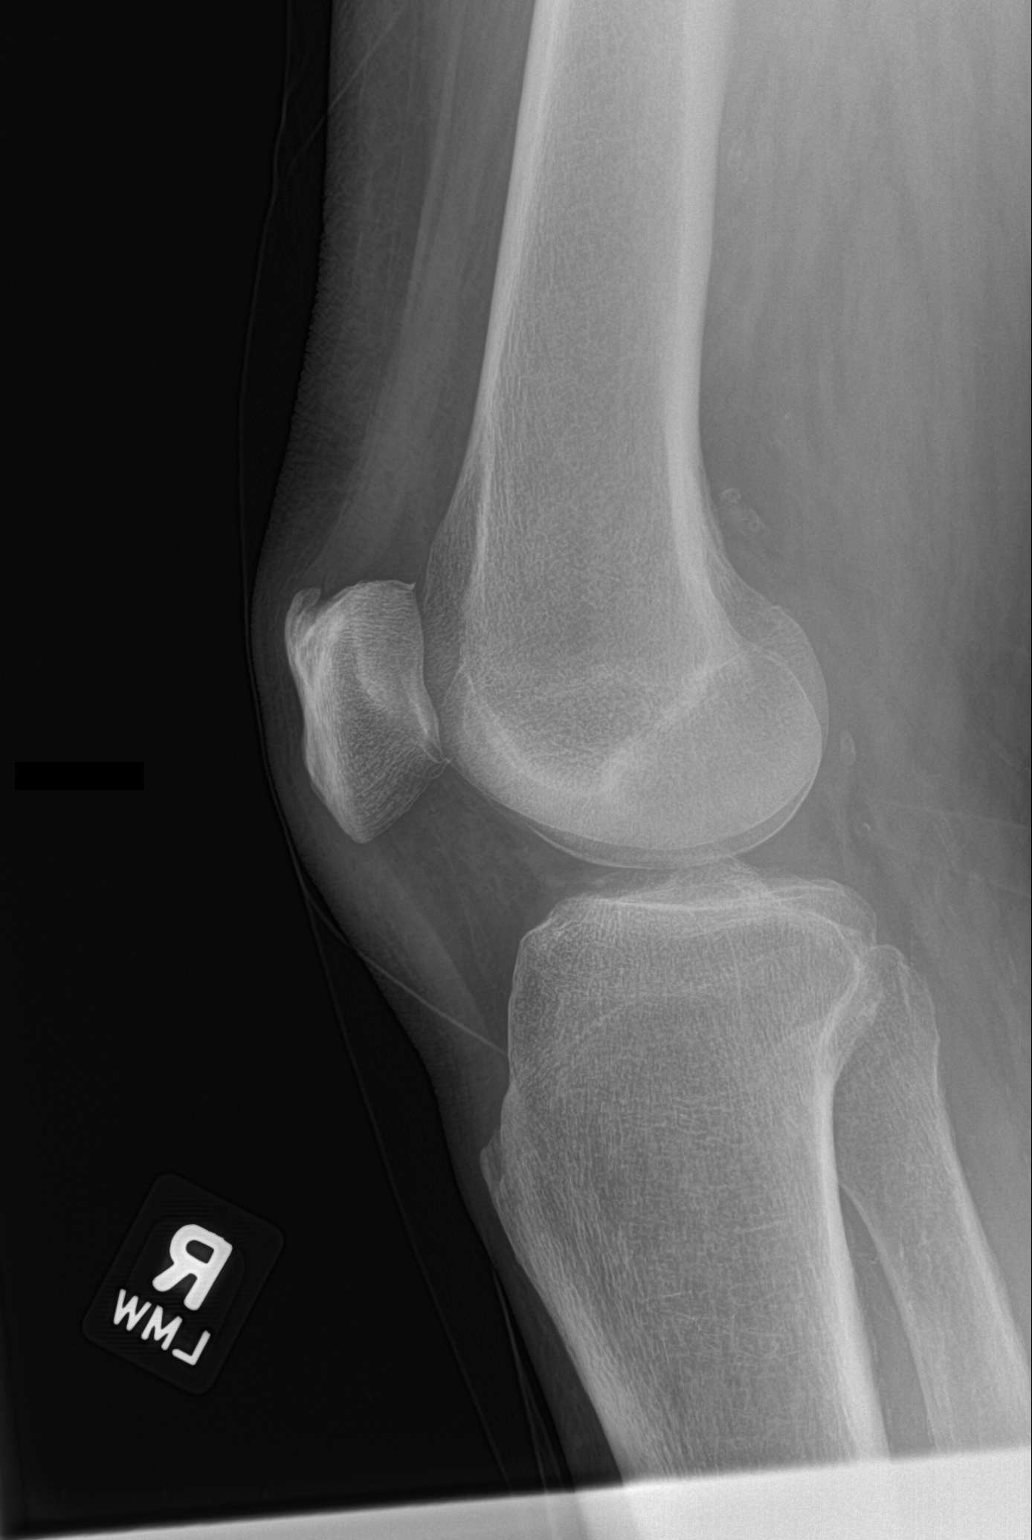

[2 of 2 positions shown; findings below may reference images not displayed]

FINDINGS: No evidence of fracture, dislocation, or joint effusion. Patellar
enthesopathy. No evidence of arthropathy or other focal bone
abnormality. Soft tissues are unremarkable. Vascular calcifications.
IMPRESSION: Negative.

## 2024-03-16 ENCOUNTER — Emergency Department

## 2024-03-16 ENCOUNTER — Emergency Department
Admission: EM | Admit: 2024-03-16 | Discharge: 2024-03-16 | Disposition: A | Discharge status: Skilled Nursing Facility | Attending: Emergency Medicine | Admitting: Emergency Medicine

## 2024-03-16 ENCOUNTER — Encounter: Payer: Self-pay | Admitting: Emergency Medicine

## 2024-03-16 ENCOUNTER — Other Ambulatory Visit: Payer: Self-pay

## 2024-03-16 DIAGNOSIS — I6782 Cerebral ischemia: Secondary | ICD-10-CM | POA: Diagnosis not present

## 2024-03-16 DIAGNOSIS — M25551 Pain in right hip: Secondary | ICD-10-CM | POA: Insufficient documentation

## 2024-03-16 DIAGNOSIS — W01198A Fall on same level from slipping, tripping and stumbling with subsequent striking against other object, initial encounter: Secondary | ICD-10-CM | POA: Diagnosis not present

## 2024-03-16 DIAGNOSIS — Z96641 Presence of right artificial hip joint: Secondary | ICD-10-CM | POA: Diagnosis not present

## 2024-03-16 DIAGNOSIS — W19XXXA Unspecified fall, initial encounter: Secondary | ICD-10-CM

## 2024-03-16 MED ORDER — ACETAMINOPHEN 500 MG PO TABS
1000.0000 mg | ORAL_TABLET | Freq: Once | ORAL | Status: DC
  Filled 2024-03-16: qty 2

## 2024-03-16 MED ORDER — OXYCODONE HCL 5 MG PO TABS
5.0000 mg | ORAL_TABLET | Freq: Once | ORAL | Status: AC
  Administered 2024-03-16: 5 mg via ORAL
  Filled 2024-03-16: qty 1

## 2024-03-16 NOTE — ED Notes (Signed)
 Patient was able to stand more comfortably with two-person assist,  but could not remain in an upright position without leaning backwards. Patient still c/o right hip pain, but it has improved with the oxycodone .

## 2024-03-16 NOTE — ED Notes (Addendum)
 Report given to Leita Kin POA, who states patient will sundown. Cell phone number is [phone].

## 2024-03-16 NOTE — ED Notes (Signed)
 Personnel from [facility] at bedside. Report given to Avala from [place].

## 2024-03-16 NOTE — ED Triage Notes (Addendum)
 First Nurse Note:  Pt via [facility] from [facility] [facility] [employer]. Pt c/o mechanical fall, c/o R hip pain pt has a hx of hip replaced approx 10-15. Denies LOC. Denies blood thinners. Pt states he did hit his head. Pt is A&Ox4 and NAD  EMS reports:  177/93 BP  62 HR  94% on RA

## 2024-03-16 NOTE — Progress Notes (Signed)
 [facility] Liaison Note   Patient is currently enrolled in hospice services with [employer] with dx of cerebrovascular disease.  Patient resides at [facility] [place] assisted living.  PCG is Katheryn Kin [phone].  Patient is DNR.   Please reach out with any hospice needs or question.  Thank you, Daphne Shed, LPN [phone]

## 2024-03-16 NOTE — ED Notes (Signed)
 Patient taken to imaging.

## 2024-03-16 NOTE — ED Provider Notes (Signed)
 "  [facility] Provider Note    Event Date/Time   First MD Initiated Contact with Patient 03/16/24 1017     (approximate)   History   Fall   HPI  [name] is a 89 y.o. male with history of hypertension, presenting with right hip pain after fall.  Patient states that he tripped and fell, did hit his head.  No LOC.  Only other complaint is hip pain.  No pain anywhere else.  Per independent history from EMS, he is coming in from home place of [place] for mechanical fall.  Has had that hip replaced on the right a decade ago.  Not on blood thinners.  Was ANO x 4 for them.  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  Was seen by oncology in 2024.  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  He denies any headache at this time.  Denies any focal weakness or numbness or vision changes.     Physical Exam   Triage Vital Signs: ED Triage Vitals  Encounter Vitals Group     BP 03/16/24 0941 (!) 177/79     Girls Systolic BP Percentile --      Girls Diastolic BP Percentile --      Boys Systolic BP Percentile --      Boys Diastolic BP Percentile --      Pulse Rate 03/16/24 0941 (!) 59     Resp 03/16/24 0941 16     Temp 03/16/24 0941 97.7 F (36.5 C)     Temp Source 03/16/24 0941 Oral     SpO2 03/16/24 0941 96 %     Weight 03/16/24 0947 200 lb (90.7 kg)     Height 03/16/24 0947 5' 11 (1.803 m)     Head Circumference --      Peak Flow --      Pain Score 03/16/24 0947 0     Pain Loc --      Pain Education --      Exclude from Growth Chart --     Most recent vital signs: Vitals:   03/16/24 0941  BP: (!) 177/79  Pulse: (!) 59  Resp: 16  Temp: 97.7 F (36.5 C)  SpO2: 96%     General: Awake, no distress.  CV:  Good peripheral perfusion.  Resp:  Normal effort.  No thoracic  cage tenderness Abd:  No distention.  Soft nontender Other:  No palpable skull deformities or tenderness, no midline spinal tenderness, no tenderness of bilateral upper extremity or his left lower extremity, able to fully range all those extremities.  He is able to range his right knee and ankle, right hip is mildly limited by pain.  He does have tenderness to the right posterior hip.  Grip strength is intact, able to dorsi and plantarflex bilaterally.  Sensation is intact.  No slurred speech or facial droop.   ED Results / Procedures / Treatments   Labs (all labs ordered are listed, but only abnormal results are displayed) Labs Reviewed - No data to display    RADIOLOGY On my independent interpretation, CT head without obvious intracranial hemorrhage   PROCEDURES:  Critical Care performed: No  Procedures   MEDICATIONS ORDERED IN ED: Medications  acetaminophen  (TYLENOL ) tablet 1,000 mg (1,000 mg Oral Patient Refused/Not Given 03/16/24 1129)  oxyCODONE  (Oxy IR/ROXICODONE ) immediate release tablet 5 mg (5 mg Oral Given 03/16/24 1507)     IMPRESSION / MDM / ASSESSMENT AND PLAN / ED COURSE  I reviewed the triage vital signs and the nursing notes.                              Differential diagnosis includes, but is not limited to, mechanical fall, contusion, strain, sprain, intracranial hemorrhage, fracture.  CT head, cervical spine, x-ray hip.  Patient's presentation is most consistent with acute presentation with potential threat to life or bodily function.  Independent interpretation of labs and imaging below.  Clinical course as below.  Patient signed out pending CT pelvis.     Clinical Course as of 03/16/24 1516  Thu Mar 16, 2024  1055 DG Hip Unilat W or Wo Pelvis 2-3 Views Right IMPRESSION: Status post right total hip arthroplasty. 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 No acute abnormality seen.   [TT]  1218 CT Cervical Spine Wo Contrast IMPRESSION: 1. No  evidence of acute cervical spine fracture, traumatic subluxation or static signs of instability. 2. Mild cervical spondylosis.   [TT]  1218 CT Head Wo Contrast IMPRESSION: 1. No acute intracranial abnormality. 2. Unchanged ventriculomegaly, which may reflect central predominant cerebral atrophy or normal pressure hydrocephalus in the appropriate clinical setting. 3. Mild chronic small vessel ischemic disease.   [TT]  1440 Trial of ambulation was unsuccessful, patient unable to bear weight.  Will do a CT of his pelvis. [TT]    Clinical Course User Index [TT] Waymond Lorelle Cummins, MD     FINAL CLINICAL IMPRESSION(S) / ED DIAGNOSES   Final diagnoses:  Fall, initial encounter  Right hip pain     Rx / DC Orders   ED Discharge Orders     None        Note:  This document was prepared using Dragon voice recognition software and may include unintentional dictation errors.    Waymond Lorelle Cummins, MD 03/16/24 [phone]  "

## 2024-03-16 NOTE — ED Notes (Signed)
 Patient was unable to bear weight. Dr. Waymond aware.

## 2024-03-16 NOTE — ED Provider Notes (Signed)
----------------------------------------- °  3:11 PM on 03/16/2024 -----------------------------------------  Blood pressure (!) 177/79, pulse (!) 59, temperature 97.7 F (36.5 C), temperature source Oral, resp. rate 16, height 5' 11 (1.803 m), weight 90.7 kg, SpO2 96%.  Assuming care from Dr. Waymond.  In short, [name] is a 89 y.o. male with a chief complaint of fall.  Refer to the original H&P for additional details.  The current plan of care is to follow-up CT of pelvis for pain following fall and inability to ambulate.  ----------------------------------------- 7:03 PM on 03/16/2024 ----------------------------------------- CT scan shows significant arthritis in the left hip but intact hardware in the right hip.  With follow-up attempted ambulation following pain medication, patient is able to stand and bear some weight on his right leg, but does have some difficulty walking.  Case discussed with Dr. Ezra of orthopedics, who does not feel MRI would be beneficial to assess patient's right hip, where his pain is located, given he already has hardware in place there.  With improving symptoms, patient appropriate for discharge back to his nursing facility.  Patient and caregiver agree with plan.   Medications  acetaminophen  (TYLENOL ) tablet 1,000 mg (1,000 mg Oral Patient Refused/Not Given 03/16/24 1129)  oxyCODONE  (Oxy IR/ROXICODONE ) immediate release tablet 5 mg (5 mg Oral Given 03/16/24 1507)     ED Discharge Orders     None      Final diagnoses:  Fall, initial encounter  Right hip pain      Willo Dunnings, MD 03/16/24 1904

## 2024-03-16 NOTE — ED Notes (Signed)
 Caregiver at bedside

## 2024-03-16 NOTE — ED Notes (Signed)
 Pt departed the ED, via [facility], via stretcher accompanied by [employer].  Nurse sign off.
